# Patient Record
Sex: Female | Born: 1945 | Race: White | Hispanic: No | Marital: Married | Smoking: Current every day smoker
Health system: Southern US, Community
[De-identification: ages and names within clinical notes are randomized; demographics above are authoritative.]

## PROBLEM LIST (undated history)

## (undated) DIAGNOSIS — F32A Depression, unspecified: Secondary | ICD-10-CM

## (undated) DIAGNOSIS — F419 Anxiety disorder, unspecified: Secondary | ICD-10-CM

## (undated) DIAGNOSIS — C3490 Malignant neoplasm of unspecified part of unspecified bronchus or lung: Secondary | ICD-10-CM

## (undated) DIAGNOSIS — K219 Gastro-esophageal reflux disease without esophagitis: Secondary | ICD-10-CM

## (undated) DIAGNOSIS — G8929 Other chronic pain: Secondary | ICD-10-CM

## (undated) DIAGNOSIS — J449 Chronic obstructive pulmonary disease, unspecified: Secondary | ICD-10-CM

## (undated) HISTORY — PX: BACK SURGERY: SHX140

## (undated) HISTORY — PX: HERNIA REPAIR: SHX51

## (undated) HISTORY — PX: TONSILLECTOMY: SUR1361

---

## 2019-09-08 MED FILL — OXYCODONE-ACETAMINOPHEN 5-3: 5-325 | 7 days supply | Qty: 28 | Fill #0

## 2019-09-26 ENCOUNTER — Other Ambulatory Visit: Payer: Self-pay | Admitting: Physical Medicine and Rehabilitation

## 2019-09-26 DIAGNOSIS — K229 Disease of esophagus, unspecified: Secondary | ICD-10-CM

## 2019-10-05 ENCOUNTER — Ambulatory Visit
Admission: RE | Admit: 2019-10-05 | Discharge: 2019-10-05 | Disposition: A | Discharge status: Home / Self Care | Payer: Federal, State, Local not specified - PPO | Source: Ambulatory Visit | Attending: Physical Medicine and Rehabilitation | Admitting: Physical Medicine and Rehabilitation

## 2019-10-05 ENCOUNTER — Other Ambulatory Visit: Payer: Self-pay

## 2019-10-05 ENCOUNTER — Encounter: Payer: Self-pay | Admitting: Family Medicine

## 2019-10-05 ENCOUNTER — Ambulatory Visit (INDEPENDENT_AMBULATORY_CARE_PROVIDER_SITE_OTHER): Payer: Federal, State, Local not specified - PPO | Admitting: Family Medicine

## 2019-10-05 VITALS — BP 126/82 | HR 98 | Temp 97.5°F | Ht 68.75 in | Wt 101.2 lb

## 2019-10-05 DIAGNOSIS — M810 Age-related osteoporosis without current pathological fracture: Secondary | ICD-10-CM

## 2019-10-05 DIAGNOSIS — Z23 Encounter for immunization: Secondary | ICD-10-CM

## 2019-10-05 DIAGNOSIS — K229 Disease of esophagus, unspecified: Secondary | ICD-10-CM

## 2019-10-05 DIAGNOSIS — F329 Major depressive disorder, single episode, unspecified: Secondary | ICD-10-CM

## 2019-10-05 DIAGNOSIS — S22000A Wedge compression fracture of unspecified thoracic vertebra, initial encounter for closed fracture: Secondary | ICD-10-CM

## 2019-10-05 DIAGNOSIS — F32A Depression, unspecified: Secondary | ICD-10-CM

## 2019-10-05 MED ORDER — IOPAMIDOL (ISOVUE-300) INJECTION 61%
75.0000 mL | Freq: Once | INTRAVENOUS | Status: AC | PRN
  Administered 2019-10-05: 75 mL via INTRAVENOUS

## 2019-10-05 NOTE — Progress Notes (Signed)
   Subjective:    Patient ID: Mariah Bruce, female    DOB: 1946-09-27, 73 y.o.   MRN: [id number]  HPI Chief Complaint  Patient presents with  . new pt    new pt get established. needs something for osteoporosis.    She is new to the practice and here for what I believed to be an establish care visit her sister is in the room with her today. Apparently the patient has been staying with her sister who lives in [place] for the past 2 months. Patient lives in [place] and is her husband is still there. She reports having a PCP in [place].  Her sister would like for the patient to stay here longer but the patient insists that she will be going home to [facility] in the next week.   Patient states the main reason for the visit today is to start on medication for osteoporosis. States she was told by [facility] that she has a compression fracture. She denies ever having a bone density scan.   She has been seeing Dr. Nelva Bush and had a CT chest done earlier today.   States she has pain medication.  No other needs expressed.   Requests flu shot today.   Hx of lung cancer with partial lung removed.   Hx of depression and is taking medication for this.    Denies fever, chills, night sweats, chest pain, shortness of breath, vomiting, diarrhea.     Review of Systems Pertinent positives and negatives in the history of present illness.     Objective:   Physical Exam BP 126/82   Pulse 98   Temp (!) 97.5 F (36.4 C)   Ht 5' 8.75" (1.746 m)   Wt 101 lb 3.2 oz (45.9 kg)   BMI 15.05 kg/m   Alert and oriented and in no acute distress. She is thin and frail appearing. Respirations unlabored. Normal speech, mood, memory and thought process.        Assessment & Plan:  Osteoporosis, unspecified osteoporosis type, unspecified pathological fracture presence  Compression fracture of body of thoracic vertebra (HCC)  Depression, unspecified depression type  Needs flu shot - Plan: Flu Vaccine QUAD  High Dose(Fluad), CANCELED: Flu Vaccine QUAD 36+ mos PF IM (Fluarix)  She is here today with her sister. She and her sister disagree with the patient's plan of staying in [place] vs going back to [facility] where she lives with her husband next week. After further discussion, the patient does not wish to establish care here and will follow up with her PCP in [place] in 1-2 weeks upon return to her home. Flu shot given. Discussed that if she does decide to move here in the future that she is welcome to establish care with our practice.

## 2019-10-09 ENCOUNTER — Other Ambulatory Visit: Payer: Self-pay

## 2020-01-04 ENCOUNTER — Telehealth: Payer: Self-pay | Admitting: Family Medicine

## 2020-01-04 NOTE — Telephone Encounter (Signed)
Requested medical records received from [facility]

## 2020-01-16 ENCOUNTER — Encounter: Payer: Self-pay | Admitting: Family Medicine

## 2020-11-12 ENCOUNTER — Encounter (HOSPITAL_BASED_OUTPATIENT_CLINIC_OR_DEPARTMENT_OTHER): Payer: Self-pay | Admitting: *Deleted

## 2020-11-12 ENCOUNTER — Inpatient Hospital Stay (HOSPITAL_BASED_OUTPATIENT_CLINIC_OR_DEPARTMENT_OTHER)
Admission: EM | Admit: 2020-11-12 | Discharge: 2020-11-15 | DRG: 689 | Disposition: A | Discharge status: Home / Self Care | Payer: Medicare Other | Attending: Internal Medicine | Admitting: Internal Medicine

## 2020-11-12 ENCOUNTER — Other Ambulatory Visit: Payer: Self-pay

## 2020-11-12 ENCOUNTER — Emergency Department (HOSPITAL_BASED_OUTPATIENT_CLINIC_OR_DEPARTMENT_OTHER): Payer: Medicare Other

## 2020-11-12 DIAGNOSIS — N390 Urinary tract infection, site not specified: Secondary | ICD-10-CM | POA: Diagnosis not present

## 2020-11-12 DIAGNOSIS — K2100 Gastro-esophageal reflux disease with esophagitis, without bleeding: Secondary | ICD-10-CM | POA: Diagnosis present

## 2020-11-12 DIAGNOSIS — Z720 Tobacco use: Secondary | ICD-10-CM | POA: Diagnosis present

## 2020-11-12 DIAGNOSIS — F17210 Nicotine dependence, cigarettes, uncomplicated: Secondary | ICD-10-CM | POA: Diagnosis present

## 2020-11-12 DIAGNOSIS — N182 Chronic kidney disease, stage 2 (mild): Secondary | ICD-10-CM | POA: Diagnosis present

## 2020-11-12 DIAGNOSIS — G9341 Metabolic encephalopathy: Secondary | ICD-10-CM | POA: Diagnosis present

## 2020-11-12 DIAGNOSIS — F32A Depression, unspecified: Secondary | ICD-10-CM | POA: Diagnosis present

## 2020-11-12 DIAGNOSIS — J449 Chronic obstructive pulmonary disease, unspecified: Secondary | ICD-10-CM | POA: Diagnosis present

## 2020-11-12 DIAGNOSIS — K209 Esophagitis, unspecified without bleeding: Secondary | ICD-10-CM

## 2020-11-12 DIAGNOSIS — Z681 Body mass index (BMI) 19 or less, adult: Secondary | ICD-10-CM

## 2020-11-12 DIAGNOSIS — G8929 Other chronic pain: Secondary | ICD-10-CM | POA: Diagnosis present

## 2020-11-12 DIAGNOSIS — Z79899 Other long term (current) drug therapy: Secondary | ICD-10-CM

## 2020-11-12 DIAGNOSIS — R079 Chest pain, unspecified: Secondary | ICD-10-CM

## 2020-11-12 DIAGNOSIS — C3490 Malignant neoplasm of unspecified part of unspecified bronchus or lung: Secondary | ICD-10-CM | POA: Diagnosis present

## 2020-11-12 DIAGNOSIS — Z880 Allergy status to penicillin: Secondary | ICD-10-CM

## 2020-11-12 DIAGNOSIS — R918 Other nonspecific abnormal finding of lung field: Secondary | ICD-10-CM | POA: Diagnosis present

## 2020-11-12 DIAGNOSIS — E43 Unspecified severe protein-calorie malnutrition: Secondary | ICD-10-CM | POA: Insufficient documentation

## 2020-11-12 DIAGNOSIS — Z20822 Contact with and (suspected) exposure to covid-19: Secondary | ICD-10-CM | POA: Diagnosis present

## 2020-11-12 DIAGNOSIS — R636 Underweight: Secondary | ICD-10-CM | POA: Diagnosis present

## 2020-11-12 DIAGNOSIS — Z902 Acquired absence of lung [part of]: Secondary | ICD-10-CM

## 2020-11-12 DIAGNOSIS — N179 Acute kidney failure, unspecified: Secondary | ICD-10-CM | POA: Diagnosis present

## 2020-11-12 DIAGNOSIS — Z79891 Long term (current) use of opiate analgesic: Secondary | ICD-10-CM

## 2020-11-12 DIAGNOSIS — Z7952 Long term (current) use of systemic steroids: Secondary | ICD-10-CM

## 2020-11-12 DIAGNOSIS — Z85118 Personal history of other malignant neoplasm of bronchus and lung: Secondary | ICD-10-CM

## 2020-11-12 DIAGNOSIS — F419 Anxiety disorder, unspecified: Secondary | ICD-10-CM | POA: Diagnosis present

## 2020-11-12 DIAGNOSIS — R319 Hematuria, unspecified: Secondary | ICD-10-CM | POA: Diagnosis present

## 2020-11-12 HISTORY — DX: Anxiety disorder, unspecified: F41.9

## 2020-11-12 HISTORY — DX: Chronic obstructive pulmonary disease, unspecified: J44.9

## 2020-11-12 HISTORY — DX: Depression, unspecified: F32.A

## 2020-11-12 HISTORY — DX: Gastro-esophageal reflux disease without esophagitis: K21.9

## 2020-11-12 HISTORY — DX: Other chronic pain: G89.29

## 2020-11-12 HISTORY — DX: Malignant neoplasm of unspecified part of unspecified bronchus or lung: C34.90

## 2020-11-12 LAB — URINALYSIS, ROUTINE W REFLEX MICROSCOPIC

## 2020-11-12 LAB — COMPREHENSIVE METABOLIC PANEL
ALT: 31 U/L (ref 0–44)
AST: 43 U/L — ABNORMAL HIGH (ref 15–41)
Albumin: 4.9 g/dL (ref 3.5–5.0)
Alkaline Phosphatase: 137 U/L — ABNORMAL HIGH (ref 38–126)
Anion gap: 16 — ABNORMAL HIGH (ref 5–15)
BUN: 26 mg/dL — ABNORMAL HIGH (ref 8–23)
CO2: 25 mmol/L (ref 22–32)
Calcium: 10.1 mg/dL (ref 8.9–10.3)
Chloride: 93 mmol/L — ABNORMAL LOW (ref 98–111)
Creatinine, Ser: 1.55 mg/dL — ABNORMAL HIGH (ref 0.44–1.00)
GFR, Estimated: 35 mL/min — ABNORMAL LOW (ref >60)
Glucose, Bld: 163 mg/dL — ABNORMAL HIGH (ref 70–99)
Potassium: 4.8 mmol/L (ref 3.5–5.1)
Sodium: 134 mmol/L — ABNORMAL LOW (ref 135–145)
Total Bilirubin: 0.5 mg/dL (ref 0.3–1.2)
Total Protein: 8.8 g/dL — ABNORMAL HIGH (ref 6.5–8.1)

## 2020-11-12 LAB — CBC WITH DIFFERENTIAL/PLATELET
Abs Immature Granulocytes: 0.09 10*3/uL — ABNORMAL HIGH (ref 0.00–0.07)
Basophils Absolute: 0 10*3/uL (ref 0.0–0.1)
Basophils Relative: 0 %
Eosinophils Absolute: 0 10*3/uL (ref 0.0–0.5)
Eosinophils Relative: 0 %
HCT: 46.6 % — ABNORMAL HIGH (ref 36.0–46.0)
Hemoglobin: 15.2 g/dL — ABNORMAL HIGH (ref 12.0–15.0)
Immature Granulocytes: 1 %
Lymphocytes Relative: 5 %
Lymphs Abs: 0.9 10*3/uL (ref 0.7–4.0)
MCH: 32.4 pg (ref 26.0–34.0)
MCHC: 32.6 g/dL (ref 30.0–36.0)
MCV: 99.4 fL (ref 80.0–100.0)
Monocytes Absolute: 0.4 10*3/uL (ref 0.1–1.0)
Monocytes Relative: 2 %
Neutro Abs: 15.8 10*3/uL — ABNORMAL HIGH (ref 1.7–7.7)
Neutrophils Relative %: 92 %
Platelets: 380 10*3/uL (ref 150–400)
RBC: 4.69 MIL/uL (ref 3.87–5.11)
RDW: 13.7 % (ref 11.5–15.5)
WBC: 17.2 10*3/uL — ABNORMAL HIGH (ref 4.0–10.5)
nRBC: 0 % (ref 0.0–0.2)

## 2020-11-12 LAB — URINALYSIS, MICROSCOPIC (REFLEX): RBC / HPF: >50 RBC/hpf (ref 0–5)

## 2020-11-12 NOTE — ED Triage Notes (Signed)
Incontinent of urine for a week. Hematuria this am.

## 2020-11-12 NOTE — ED Notes (Signed)
Unable to urinate

## 2020-11-12 NOTE — ED Notes (Signed)
Pt. Reports bleeding with urination just started today.

## 2020-11-13 ENCOUNTER — Inpatient Hospital Stay (HOSPITAL_COMMUNITY): Payer: Medicare Other

## 2020-11-13 ENCOUNTER — Emergency Department (HOSPITAL_BASED_OUTPATIENT_CLINIC_OR_DEPARTMENT_OTHER): Payer: Medicare Other

## 2020-11-13 ENCOUNTER — Encounter (HOSPITAL_BASED_OUTPATIENT_CLINIC_OR_DEPARTMENT_OTHER): Payer: Self-pay | Admitting: Emergency Medicine

## 2020-11-13 DIAGNOSIS — J449 Chronic obstructive pulmonary disease, unspecified: Secondary | ICD-10-CM | POA: Diagnosis present

## 2020-11-13 DIAGNOSIS — Z79899 Other long term (current) drug therapy: Secondary | ICD-10-CM | POA: Diagnosis not present

## 2020-11-13 DIAGNOSIS — E43 Unspecified severe protein-calorie malnutrition: Secondary | ICD-10-CM | POA: Diagnosis present

## 2020-11-13 DIAGNOSIS — F17210 Nicotine dependence, cigarettes, uncomplicated: Secondary | ICD-10-CM | POA: Diagnosis present

## 2020-11-13 DIAGNOSIS — N179 Acute kidney failure, unspecified: Secondary | ICD-10-CM

## 2020-11-13 DIAGNOSIS — N3001 Acute cystitis with hematuria: Secondary | ICD-10-CM

## 2020-11-13 DIAGNOSIS — F419 Anxiety disorder, unspecified: Secondary | ICD-10-CM | POA: Diagnosis present

## 2020-11-13 DIAGNOSIS — Z79891 Long term (current) use of opiate analgesic: Secondary | ICD-10-CM | POA: Diagnosis not present

## 2020-11-13 DIAGNOSIS — N182 Chronic kidney disease, stage 2 (mild): Secondary | ICD-10-CM | POA: Diagnosis present

## 2020-11-13 DIAGNOSIS — R319 Hematuria, unspecified: Secondary | ICD-10-CM | POA: Diagnosis present

## 2020-11-13 DIAGNOSIS — Z880 Allergy status to penicillin: Secondary | ICD-10-CM | POA: Diagnosis not present

## 2020-11-13 DIAGNOSIS — Z7952 Long term (current) use of systemic steroids: Secondary | ICD-10-CM | POA: Diagnosis not present

## 2020-11-13 DIAGNOSIS — G9341 Metabolic encephalopathy: Secondary | ICD-10-CM | POA: Diagnosis present

## 2020-11-13 DIAGNOSIS — N390 Urinary tract infection, site not specified: Secondary | ICD-10-CM | POA: Diagnosis present

## 2020-11-13 DIAGNOSIS — Z902 Acquired absence of lung [part of]: Secondary | ICD-10-CM | POA: Diagnosis not present

## 2020-11-13 DIAGNOSIS — C3492 Malignant neoplasm of unspecified part of left bronchus or lung: Secondary | ICD-10-CM

## 2020-11-13 DIAGNOSIS — Z20822 Contact with and (suspected) exposure to covid-19: Secondary | ICD-10-CM | POA: Diagnosis present

## 2020-11-13 DIAGNOSIS — C3490 Malignant neoplasm of unspecified part of unspecified bronchus or lung: Secondary | ICD-10-CM | POA: Diagnosis present

## 2020-11-13 DIAGNOSIS — Z720 Tobacco use: Secondary | ICD-10-CM | POA: Diagnosis present

## 2020-11-13 DIAGNOSIS — R636 Underweight: Secondary | ICD-10-CM | POA: Diagnosis present

## 2020-11-13 DIAGNOSIS — F32A Depression, unspecified: Secondary | ICD-10-CM | POA: Diagnosis present

## 2020-11-13 DIAGNOSIS — Z681 Body mass index (BMI) 19 or less, adult: Secondary | ICD-10-CM | POA: Diagnosis not present

## 2020-11-13 DIAGNOSIS — G8929 Other chronic pain: Secondary | ICD-10-CM | POA: Diagnosis present

## 2020-11-13 DIAGNOSIS — Z85118 Personal history of other malignant neoplasm of bronchus and lung: Secondary | ICD-10-CM | POA: Diagnosis not present

## 2020-11-13 DIAGNOSIS — R918 Other nonspecific abnormal finding of lung field: Secondary | ICD-10-CM | POA: Diagnosis present

## 2020-11-13 DIAGNOSIS — K2100 Gastro-esophageal reflux disease with esophagitis, without bleeding: Secondary | ICD-10-CM | POA: Diagnosis present

## 2020-11-13 LAB — TROPONIN I (HIGH SENSITIVITY)
Troponin I (High Sensitivity): 15 ng/L (ref <18)
Troponin I (High Sensitivity): 16 ng/L (ref <18)

## 2020-11-13 LAB — BASIC METABOLIC PANEL
Anion gap: 13 (ref 5–15)
BUN: 19 mg/dL (ref 8–23)
CO2: 24 mmol/L (ref 22–32)
Calcium: 9.3 mg/dL (ref 8.9–10.3)
Chloride: 99 mmol/L (ref 98–111)
Creatinine, Ser: 1.19 mg/dL — ABNORMAL HIGH (ref 0.44–1.00)
GFR, Estimated: 48 mL/min — ABNORMAL LOW (ref >60)
Glucose, Bld: 77 mg/dL (ref 70–99)
Potassium: 3.9 mmol/L (ref 3.5–5.1)
Sodium: 136 mmol/L (ref 135–145)

## 2020-11-13 LAB — PROTIME-INR
INR: 1.1 (ref 0.8–1.2)
Prothrombin Time: 13.4 seconds (ref 11.4–15.2)

## 2020-11-13 LAB — CBC
HCT: 36.2 % (ref 36.0–46.0)
Hemoglobin: 12.1 g/dL (ref 12.0–15.0)
MCH: 33 pg (ref 26.0–34.0)
MCHC: 33.4 g/dL (ref 30.0–36.0)
MCV: 98.6 fL (ref 80.0–100.0)
Platelets: 237 10*3/uL (ref 150–400)
RBC: 3.67 MIL/uL — ABNORMAL LOW (ref 3.87–5.11)
RDW: 13.5 % (ref 11.5–15.5)
WBC: 9.9 10*3/uL (ref 4.0–10.5)
nRBC: 0 % (ref 0.0–0.2)

## 2020-11-13 LAB — RESP PANEL BY RT-PCR (FLU A&B, COVID) ARPGX2
Influenza A by PCR: NEGATIVE
Influenza B by PCR: NEGATIVE
SARS Coronavirus 2 by RT PCR: NEGATIVE

## 2020-11-13 LAB — APTT: aPTT: 27 seconds (ref 24–36)

## 2020-11-13 MED ORDER — GABAPENTIN 300 MG PO CAPS
600.0000 mg | ORAL_CAPSULE | Freq: Every day | ORAL | Status: DC
  Administered 2020-11-13 – 2020-11-14 (×2): 600 mg via ORAL
  Filled 2020-11-13 (×2): qty 2

## 2020-11-13 MED ORDER — ACETAMINOPHEN 325 MG PO TABS
650.0000 mg | ORAL_TABLET | Freq: Four times a day (QID) | ORAL | Status: DC | PRN
  Administered 2020-11-15: 650 mg via ORAL
  Filled 2020-11-13 (×2): qty 2

## 2020-11-13 MED ORDER — GABAPENTIN 300 MG PO CAPS
300.0000 mg | ORAL_CAPSULE | Freq: Every day | ORAL | Status: DC
  Administered 2020-11-13 – 2020-11-15 (×3): 300 mg via ORAL
  Filled 2020-11-13 (×3): qty 1

## 2020-11-13 MED ORDER — FAMOTIDINE 20 MG PO TABS
40.0000 mg | ORAL_TABLET | Freq: Every day | ORAL | Status: DC
  Administered 2020-11-13 – 2020-11-15 (×3): 40 mg via ORAL
  Filled 2020-11-13 (×3): qty 2

## 2020-11-13 MED ORDER — SODIUM CHLORIDE 0.9 % IV SOLN: INTRAVENOUS | Status: DC

## 2020-11-13 MED ORDER — ONDANSETRON HCL 4 MG PO TABS: 4.0000 mg | ORAL_TABLET | Freq: Four times a day (QID) | ORAL | Status: DC | PRN

## 2020-11-13 MED ORDER — DEXAMETHASONE 4 MG PO TABS
4.0000 mg | ORAL_TABLET | Freq: Every day | ORAL | Status: DC
  Administered 2020-11-13 – 2020-11-14 (×2): 4 mg via ORAL
  Filled 2020-11-13 (×2): qty 1

## 2020-11-13 MED ORDER — CITALOPRAM HYDROBROMIDE 20 MG PO TABS: 30.0000 mg | ORAL_TABLET | Freq: Every day | ORAL | Status: DC

## 2020-11-13 MED ORDER — CHLORHEXIDINE GLUCONATE CLOTH 2 % EX PADS
6.0000 | MEDICATED_PAD | Freq: Every day | CUTANEOUS | Status: DC
  Administered 2020-11-14: 6 via TOPICAL

## 2020-11-13 MED ORDER — SODIUM CHLORIDE 0.9 % IV SOLN
1.0000 g | INTRAVENOUS | Status: DC
  Administered 2020-11-13 – 2020-11-14 (×2): 1 g via INTRAVENOUS
  Filled 2020-11-13 (×2): qty 1
  Filled 2020-11-13: qty 10

## 2020-11-13 MED ORDER — SODIUM CHLORIDE 0.9 % IV SOLN: INTRAVENOUS | Status: AC

## 2020-11-13 MED ORDER — ALBUTEROL SULFATE (2.5 MG/3ML) 0.083% IN NEBU: 2.5000 mg | INHALATION_SOLUTION | Freq: Four times a day (QID) | RESPIRATORY_TRACT | Status: DC | PRN

## 2020-11-13 MED ORDER — VENLAFAXINE HCL ER 75 MG PO CP24
75.0000 mg | ORAL_CAPSULE | Freq: Every day | ORAL | Status: DC
Start: 2020-11-14 — End: 2020-11-14
  Filled 2020-11-13: qty 1

## 2020-11-13 MED ORDER — CIPROFLOXACIN IN D5W 400 MG/200ML IV SOLN
400.0000 mg | Freq: Once | INTRAVENOUS | Status: AC
  Administered 2020-11-13: 400 mg via INTRAVENOUS
  Filled 2020-11-13: qty 200

## 2020-11-13 MED ORDER — HYDRALAZINE HCL 20 MG/ML IJ SOLN
10.0000 mg | INTRAMUSCULAR | Status: DC | PRN
  Administered 2020-11-13: 10 mg via INTRAVENOUS
  Filled 2020-11-13: qty 1

## 2020-11-13 MED ORDER — DICLOFENAC SODIUM 1 % EX GEL
2.0000 g | Freq: Two times a day (BID) | CUTANEOUS | Status: DC | PRN
  Filled 2020-11-13: qty 100

## 2020-11-13 MED ORDER — ONDANSETRON HCL 4 MG/2ML IJ SOLN: 4.0000 mg | Freq: Four times a day (QID) | INTRAMUSCULAR | Status: DC | PRN

## 2020-11-13 MED ORDER — LORAZEPAM 0.5 MG PO TABS: 0.5000 mg | ORAL_TABLET | ORAL | Status: DC | PRN

## 2020-11-13 MED ORDER — SENNOSIDES-DOCUSATE SODIUM 8.6-50 MG PO TABS
2.0000 | ORAL_TABLET | Freq: Two times a day (BID) | ORAL | Status: DC
  Administered 2020-11-13 – 2020-11-15 (×5): 2 via ORAL
  Filled 2020-11-13 (×5): qty 2

## 2020-11-13 MED ORDER — GABAPENTIN 300 MG PO CAPS: 300.0000 mg | ORAL_CAPSULE | ORAL | Status: DC

## 2020-11-13 MED ORDER — IOHEXOL 300 MG/ML  SOLN
100.0000 mL | Freq: Once | INTRAMUSCULAR | Status: AC | PRN
  Administered 2020-11-13: 60 mL via INTRAVENOUS

## 2020-11-13 MED ORDER — AMITRIPTYLINE HCL 10 MG PO TABS
10.0000 mg | ORAL_TABLET | Freq: Every day | ORAL | Status: DC
  Administered 2020-11-13 – 2020-11-14 (×2): 10 mg via ORAL
  Filled 2020-11-13 (×3): qty 1

## 2020-11-13 MED ORDER — VENLAFAXINE HCL ER 37.5 MG PO CP24
37.5000 mg | ORAL_CAPSULE | Freq: Every day | ORAL | Status: DC
  Administered 2020-11-13: 37.5 mg via ORAL
  Filled 2020-11-13 (×2): qty 1

## 2020-11-13 MED ORDER — SODIUM CHLORIDE 0.9% FLUSH
3.0000 mL | Freq: Two times a day (BID) | INTRAVENOUS | Status: DC
  Administered 2020-11-13 – 2020-11-14 (×3): 3 mL via INTRAVENOUS

## 2020-11-13 MED ORDER — ACETAMINOPHEN 650 MG RE SUPP: 650.0000 mg | Freq: Four times a day (QID) | RECTAL | Status: DC | PRN

## 2020-11-13 NOTE — ED Notes (Signed)
Report given to [facility]. 

## 2020-11-13 NOTE — H&P (Addendum)
History and Physical    Mariah Bruce VPX:[id number] DOB: 25-Nov-1945 DOA: 11/12/2020  Referring MD/NP/PA: Shela Leff, MD PCP: Girtha Rm, NP-C  Patient coming from: [facility] transfer  Chief Complaint:"peeing blood"  I have personally briefly reviewed patient's old medical records in [place]   HPI: Mariah Bruce is a 76 y.o. female with medical history significant of lung cancer, COPD, anxiety, depression, tobacco abuse, and GERD presents with complaints of blood in her urine.  Patient is visiting her sister from [place], [place] and has been here 2 days.  Symptoms started yesterday mid afternoon.  Initially reported brown urine that turned to bright red with passage of some clots.  Patient admits to using ibuprofen intermittently.  Over the last week patient has had some middle back pain that wraps around to her stomach and complains that her stomach has been kind of bloated.  Denies having any recent fever, dysuria, chest pain, shortness of breath, nausea, vomiting, or diarrhea.  At this time she continues to smoke about a pack of cigarettes per day on average, but declines nicotine patch.  Patient was diagnosed with lung cancer back in 2014, and notes that it was surgically removed.  She is unsure of what kind of cancer it was and did not have radiation or chemotherapy.  Patient reports that she does not want to have any further work-up of the lung cancer.  Addendum: Patient sister comes and notifies me that the patient's has been more forgetful and more anxious.  ED Course: Upon admission to the emergency department patient was seen to be afebrile with blood pressure 95/65-180/97, and all other vital signs maintained.  Labs significant for WBC 17.2, hemoglobin 15.2, sodium 134, BUN 26, creatinine 1.55, glucose 163, AST 43,.  Urinalysis was positive for bloody urine with few bacteria.  Influenza and COVID-19 screening were both negative.  CT scans of the neck and chest  revealed concern for left vocal cord paralysis, distal esophagitis, spiculated foci within the medial left lung apex measuring 9 x 17 mm(increased from 7 x 11 mm), new subpleural nodule measuring 12.5 x 8 mm on the right lower lobe periphery.  A Foley catheter was placed in the emergency department.  Patient was given empiric antibiotics of ciprofloxacin 400 mg IV and started on normal saline IV fluids at 125 mL/h.  Review of Systems  Constitutional: Negative for fever and malaise/fatigue.  HENT: Negative for congestion and nosebleeds.   Eyes: Negative for photophobia and pain.  Respiratory: Negative for shortness of breath.   Cardiovascular: Negative for chest pain and leg swelling.  Gastrointestinal: Positive for abdominal pain. Negative for blood in stool.  Genitourinary: Positive for dysuria and hematuria.  Musculoskeletal: Positive for back pain.  Skin: Negative for rash.  Neurological: Negative for loss of consciousness.  Psychiatric/Behavioral: Positive for substance abuse. The patient is nervous/anxious.     Past Medical History:  Diagnosis Date  . Anxiety   . Chronic pain   . Depression   . GERD (gastroesophageal reflux disease)     Past Surgical History:  Procedure Laterality Date  . BACK SURGERY    . HERNIA REPAIR    . TONSILLECTOMY       reports that she has been smoking cigarettes. She has a 55.00 pack-year smoking history. She has never used smokeless tobacco. She reports current alcohol use. She reports that she does not use drugs.  Allergies  Allergen Reactions  . Penicillin G Rash    History reviewed. No pertinent  family history.  Prior to Admission medications   Medication Sig Start Date End Date Taking? Authorizing Provider  amitriptyline (ELAVIL) 10 MG tablet Take 1 tablet by mouth 2 (two) times daily. 07/31/19  Yes [provider]  escitalopram (LEXAPRO) 20 MG tablet Take 1 tablet by mouth daily. 07/31/19  Yes [provider]   gabapentin (NEURONTIN) 300 MG capsule Take 1 capsule by mouth 3 (three) times daily. 07/28/19  Yes [provider]  oxyCODONE-acetaminophen (PERCOCET/ROXICET) 5-325 MG tablet Take 1 tablet by mouth 3 (three) times daily.   Yes [provider]  predniSONE (DELTASONE) 10 MG tablet Take 10 mg by mouth.   Yes [provider]  senna-docusate (SENOKOT-S) 8.6-50 MG tablet Take 1 tablet by mouth daily.   Yes [provider]  calcitonin, salmon, (MIACALCIN/FORTICAL) 200 UNIT/ACT nasal spray Place 1 spray into alternate nostrils daily.    [provider]  calcium carbonate (OS-CAL) 600 MG TABS tablet Take 600 mg by mouth 2 (two) times daily with a meal.    [provider]  diclofenac Sodium (VOLTAREN) 1 % GEL Apply 1 g topically 3 (three) times daily.    [provider]  Lidocaine HCl-Benzyl Alcohol (SALONPAS LIDOCAINE PLUS EX) Apply topically.    [provider]    Physical Exam:  Constitutional: Thin frail female who appears to be in no acute Vitals:   11/13/20 0155 11/13/20 0300 11/13/20 0430 11/13/20 0556  BP: (!) 157/74 (!) 168/76 (!) 167/81 (!) 180/97  Pulse: 69 67  71  Resp: 18 16 13 18   Temp:    98.1 F (36.7 C)  TempSrc:    Oral  SpO2: 98% 95%  96%  Weight:      Height:       Eyes: PERRL, lids and conjunctivae normal ENMT: Mucous membranes are moist. Posterior pharynx clear of any exudate or lesions.  Neck: normal, supple, no masses, no thyromegaly Respiratory: clear to auscultation bilaterally, no wheezing, no crackles. Normal respiratory effort. No accessory muscle use.  Cardiovascular: Regular rate and rhythm, no murmurs / rubs / gallops. No extremity edema. 2+ pedal pulses. No carotid bruits.  Abdomen: Mild distention of the abdomen appreciated, but bowel sounds present. Musculoskeletal: no clubbing / cyanosis. No joint deformity upper and lower extremities. Good ROM, no contractures.  Skin: no rashes,  lesions, ulcers. No induration Neurologic: CN 2-12 grossly intact. Sensation intact, DTR normal. Strength 5/5 in all 4.  Psychiatric: Trouble with memory although alert and oriented x3 at this time.  Anxious.    Labs on Admission: I have personally reviewed following labs and imaging studies  CBC: Recent Labs  Lab 11/12/20 2022  WBC 17.2*  NEUTROABS 15.8*  HGB 15.2*  HCT 46.6*  MCV 99.4  PLT 681   Basic Metabolic Panel: Recent Labs  Lab 11/12/20 2022  NA 134*  K 4.8  CL 93*  CO2 25  GLUCOSE 163*  BUN 26*  CREATININE 1.55*  CALCIUM 10.1   GFR: Estimated Creatinine Clearance: 22.8 mL/min (A) (by C-G formula based on SCr of 1.55 mg/dL (H)). Liver Function Tests: Recent Labs  Lab 11/12/20 2022  AST 43*  ALT 31  ALKPHOS 137*  BILITOT 0.5  PROT 8.8*  ALBUMIN 4.9   No results for input(s): LIPASE, AMYLASE in the last 168 hours. No results for input(s): AMMONIA in the last 168 hours. Coagulation Profile: No results for input(s): INR, PROTIME in the last 168 hours. Cardiac Enzymes: No results for input(s): CKTOTAL, CKMB,  CKMBINDEX, TROPONINI in the last 168 hours. BNP (last 3 results) No results for input(s): PROBNP in the last 8760 hours. HbA1C: No results for input(s): HGBA1C in the last 72 hours. CBG: No results for input(s): GLUCAP in the last 168 hours. Lipid Profile: No results for input(s): CHOL, HDL, LDLCALC, TRIG, CHOLHDL, LDLDIRECT in the last 72 hours. Thyroid Function Tests: No results for input(s): TSH, T4TOTAL, FREET4, T3FREE, THYROIDAB in the last 72 hours. Anemia Panel: No results for input(s): VITAMINB12, FOLATE, FERRITIN, TIBC, IRON, RETICCTPCT in the last 72 hours. Urine analysis:    Component Value Date/Time   COLORURINE RED (A) 11/12/2020 2022   APPEARANCEUR TURBID (A) 11/12/2020 2022   LABSPEC  11/12/2020 2022    TEST NOT REPORTED DUE TO COLOR INTERFERENCE OF URINE PIGMENT   PHURINE  11/12/2020 2022    TEST NOT REPORTED DUE TO COLOR  INTERFERENCE OF URINE PIGMENT   GLUCOSEU (A) 11/12/2020 2022    TEST NOT REPORTED DUE TO COLOR INTERFERENCE OF URINE PIGMENT   HGBUR (A) 11/12/2020 2022    TEST NOT REPORTED DUE TO COLOR INTERFERENCE OF URINE PIGMENT   BILIRUBINUR (A) 11/12/2020 2022    TEST NOT REPORTED DUE TO COLOR INTERFERENCE OF URINE PIGMENT   KETONESUR (A) 11/12/2020 2022    TEST NOT REPORTED DUE TO COLOR INTERFERENCE OF URINE PIGMENT   PROTEINUR (A) 11/12/2020 2022    TEST NOT REPORTED DUE TO COLOR INTERFERENCE OF URINE PIGMENT   NITRITE (A) 11/12/2020 2022    TEST NOT REPORTED DUE TO COLOR INTERFERENCE OF URINE PIGMENT   LEUKOCYTESUR (A) 11/12/2020 2022    TEST NOT REPORTED DUE TO COLOR INTERFERENCE OF URINE PIGMENT   Sepsis Labs: Recent Results (from the past 240 hour(s))  Resp Panel by RT-PCR (Flu A&B, Covid) Nasopharyngeal Swab     Status: None   Collection Time: 11/13/20  2:26 AM   Specimen: Nasopharyngeal Swab; Nasopharyngeal(NP) swabs in vial transport medium  Result Value Ref Range Status   SARS Coronavirus 2 by RT PCR NEGATIVE NEGATIVE Final    Comment: (NOTE) SARS-CoV-2 target nucleic acids are NOT DETECTED.  The SARS-CoV-2 RNA is generally detectable in upper respiratory specimens during the acute phase of infection. The lowest concentration of SARS-CoV-2 viral copies this assay can detect is 138 copies/mL. A negative result does not preclude SARS-Cov-2 infection and should not be used as the sole basis for treatment or other patient management decisions. A negative result may occur with  improper specimen collection/handling, submission of specimen other than nasopharyngeal swab, presence of viral mutation(s) within the areas targeted by this assay, and inadequate number of viral copies(<138 copies/mL). A negative result must be combined with clinical observations, patient history, and epidemiological information. The expected result is Negative.  Fact Sheet for Patients:   EntrepreneurPulse.com.au  Fact Sheet for Healthcare Providers:  IncredibleEmployment.be  This test is no t yet approved or cleared by the [place] FDA and  has been authorized for detection and/or diagnosis of SARS-CoV-2 by FDA under an Emergency Use Authorization (EUA). This EUA will remain  in effect (meaning this test can be used) for the duration of the COVID-19 declaration under Section 564(b)(1) of the Act, 21 U.S.C.section 360bbb-3(b)(1), unless the authorization is terminated  or revoked sooner.       Influenza A by PCR NEGATIVE NEGATIVE Final   Influenza B by PCR NEGATIVE NEGATIVE Final    Comment: (NOTE) The Xpert Xpress SARS-CoV-2/FLU/RSV plus assay is intended as an aid in the  diagnosis of influenza from Nasopharyngeal swab specimens and should not be used as a sole basis for treatment. Nasal washings and aspirates are unacceptable for Xpert Xpress SARS-CoV-2/FLU/RSV testing.  Fact Sheet for Patients: EntrepreneurPulse.com.au  Fact Sheet for Healthcare Providers: IncredibleEmployment.be  This test is not yet approved or cleared by the [place] FDA and has been authorized for detection and/or diagnosis of SARS-CoV-2 by FDA under an Emergency Use Authorization (EUA). This EUA will remain in effect (meaning this test can be used) for the duration of the COVID-19 declaration under Section 564(b)(1) of the Act, 21 U.S.C. section 360bbb-3(b)(1), unless the authorization is terminated or revoked.  Performed at [facility], [address] [place], [place] [address]      Radiological Exams on Admission: CT Soft Tissue Neck W Contrast  Result Date: 11/13/2020 CLINICAL DATA:  Esophageal pain EXAM: CT NECK WITH CONTRAST TECHNIQUE: Multidetector CT imaging of the neck was performed using the standard protocol following the bolus administration of intravenous contrast. CONTRAST:   65mL OMNIPAQUE IOHEXOL 300 MG/ML  SOLN COMPARISON:  None. FINDINGS: Pharynx and larynx: There is medialization of the left vocal fold with dilatation of the left piriform sinus. The larynx and pharynx are otherwise normal. Normal epiglottis. No retropharyngeal abnormality. Esophagus is patulous. There is layering contrast material visible within the upper thoracic esophagus. Salivary glands: No inflammation, mass, or stone. Thyroid: Normal. Lymph nodes: None enlarged or abnormal density. Vascular: Calcific aortic atherosclerosis. Limited intracranial: Normal Visualized orbits: Normal Mastoids and visualized paranasal sinuses: Clear. Skeleton: No acute or aggressive process. Upper chest: Please see dedicated report for CT chest. Other: None. IMPRESSION: 1. Medialization of the left vocal fold with dilatation of the left piriform sinus, suggesting left vocal cord paralysis. 2. Patulous esophagus. Aortic Atherosclerosis (ICD10-I70.0). Electronically Signed   By: Ulyses Jarred M.D.   On: 11/13/2020 01:51   CT Chest W Contrast  Result Date: 11/13/2020 CLINICAL DATA:  Chest pain, shortness of breath, incontinent urine, hematuria, abnormal abdominal CT EXAM: CT CHEST WITH CONTRAST TECHNIQUE: Multidetector CT imaging of the chest was performed during intravenous contrast administration. CONTRAST:  72mL OMNIPAQUE IOHEXOL 300 MG/ML  SOLN COMPARISON:  Same-day CT abdomen and pelvis, CT chest 10/05/2019 FINDINGS: Cardiovascular: Cardiac size top-normal to mildly enlarged. Mild compression of the right heart by a chronic chest wall deformity. Calcifications of the mitral annulus and minimally upon the aortic leaflets. Coronary artery calcifications are present as well. No pericardial effusion. No large central or lobar pulmonary artery filling defects within the limitations of this unenhanced CT. Surgical truncation of left hilar vessels, likely related to prior lobectomy. Atherosclerotic plaque within the normal caliber  aorta. No acute luminal abnormality of the imaged aorta. No periaortic stranding or hemorrhage. Normal 3 vessel branching of the aortic arch. Proximal great vessels are mildly calcified but otherwise unremarkable. Mediastinum/Nodes: High attenuation enteric contrast media was administered with the patient on the table for assessment of the thoracic esophagus. There is circumferential thickening of the distal thoracic esophagus and to a lesser extent the GE junction however, there is no extravasation of the enteric contrast media nor extraluminal free air to suggest esophageal perforation. Some minimal adjacent stranding is likely reactive. Fluid within the pericardial recesses. No free mediastinal fluid or gas. No organized collection or abscess. Normal thyroid gland and thoracic inlet. No acute abnormality of the trachea or esophagus. No worrisome mediastinal, hilar or axillary adenopathy. Lungs/Pleura: Postsurgical changes in the lungs likely reflecting a prior left lobectomy, correlate  with surgical history. There is a small chronic left pleural effusion with some minimal pleural thickening and adjacent atelectasis, not significantly changed from comparison exam. There is increasingly conspicuous spiculated foci in the medial left lung apex measuring now 9 x 17 mm, previously 7 x 11 mm (3/25) additional scarring, retraction architectural distortion is seen involving a spiculated nodule previously seen in the right lung apex (3/27 with changes now extending to the pleural surface measuring 2.5 x 2.2 cm in transaxial dimension. Some additional bandlike scarring is seen in the posterior segment right upper lobe (3/60). New sub solid, subpleural nodule measuring 12.5 x 8 mm (3/116) in the periphery of the right lower lobe. No other concerning pulmonary nodules or masses. Background of centrilobular emphysematous change and chronic bronchitic features. Upper Abdomen: Vascular collaterals in the upper abdomen about the  esophagus and GE junction. Mild thickening near the gastric cardia, contiguous with esophageal thickening above. For additional findings please see same day CT of the abdomen and pelvis. Musculoskeletal: Chest wall asymmetry, possibly postsurgical in nature. Paucity of subcutaneous fat, correlate with nutritional status. Multilevel degenerative changes are present in the imaged portions of the spine. Interval vertebroplasty changes involving the T6, T7 vertebrae, unchanged remote vertebral body height loss T10. Age indeterminate though possibly acute on chronic T11 vertebral plana deformity given some adjacent paraspinal thickening. No other acute or conspicuous osseous lesions. IMPRESSION: 1. Circumferential thickening of the distal thoracic esophagus and gastric cardia. No extravasation of administered enteric contrast media nor extraluminal gas to suggest perforation. Minimal adjacent stranding may be merely reactive. 2. Postsurgical changes in the left lung likely reflecting a prior left lower lobectomy, correlate with surgical history. Chronic left basilar effusion and pleural thickening is grossly similar to prior. 3. Increasingly conspicuous spiculated foci in the medial left lung apex measuring now 9 x 17 mm, previously 7 x 11 mm. Additional scarring, retraction architectural distortion is seen involving a spiculated nodule previously seen in the right lung apex with changes now extending to the pleural surface measuring 2.5 x 2.2 cm in transaxial dimension. Findings are concerning for possible malignancy. 4. New sub solid, subpleural nodule measuring 12.5 x 8 mm in the periphery of the right lower lobe. This could reflect an infectious or inflammatory process versus a metastatic lesion. 5. Interval vertebroplasty changes involving the T6, T7 vertebrae. Stable remote compression deformity T10. 6. Possible acute on chronic T11 vertebral plana deformity given some adjacent paraspinal thickening. Correlate for  point tenderness. 7. Chronic chest wall asymmetry. 8. Paucity of subcutaneous fat, correlate with nutritional status. 9. Aortic Atherosclerosis (ICD10-I70.0) 10. Emphysema (ICD10-J43.9) and chronic bronchitic changes. Electronically Signed   By: Lovena Le M.D.   On: 11/13/2020 01:57   CT Renal Stone Study  Result Date: 11/13/2020 CLINICAL DATA:  Hematuria, unknown cause EXAM: CT ABDOMEN AND PELVIS WITHOUT CONTRAST TECHNIQUE: Multidetector CT imaging of the abdomen and pelvis was performed following the standard protocol without IV contrast. COMPARISON:  CT chest 10/05/2019 FINDINGS: Lower chest: Emphysematous changes noted in the lung bases. Bandlike opacity in the lung bases likely reflect areas of scarring. Right lung is otherwise relatively clear. There is a trace left pleural effusion and atelectatic change with some questionable pleural thickening, incompletely assessed on noncontrast exam. Thickened and fluid-filled distal esophagus is noted. Cardiac size within normal limits with slight compression of the right heart by the chest wall deformity. Hypoattenuation of the cardiac blood pool may reflect a mild anemia. Hepatobiliary: No visible focal liver lesion within  limitations of this unenhanced CT gallbladder contains some dependently layering attenuation which may reflect biliary sludge. No pericholecystic fluid or inflammation. No biliary ductal dilatation or intraductal gallstones are seen. Pancreas: There is fatty infiltration of the pancreas, particularly towards the pancreatic head and uncinate. No clear peripancreatic inflammation or pancreatic ductal dilatation. Spleen: Normal in size. No concerning splenic lesions. Adrenals/Urinary Tract: Normal adrenal glands. Slight inferior positioning of the right kidney may related to normal anatomic variance or chest wall deformity. No visible concerning renal lesion. No urolithiasis or hydronephrosis. Mild symmetric bilateral perinephric stranding, a  nonspecific finding which may correlate with advanced age or decreased renal function. Bladder is largely decompressed though wall thickening and hazy perivesicular stranding is more conspicuous than mere underdistention. Additionally, there is some questionable asymmetric thickening on the left posterolateral aspect of the bladder wall and some layering hyperdensity in the urinary bladder lumen concerning for hemorrhagic products/clot given hematuria. Stomach/Bowel: Irregular thickening of the fluid-filled distal thoracic esophagus with adjacent inflammatory stranding lower mediastinum. Extensive paraesophageal and gastric vascular collaterals are noted as well. Distal stomach and duodenum are unremarkable. No small bowel thickening or dilatation. Moderate to large colonic stool burden. Normal appendix. No evidence of bowel obstruction. Vascular/Lymphatic: Upper abdominal vascular collaterals. Atherosclerotic calcifications within the abdominal aorta and branch vessels. No aneurysm or ectasia. No enlarged abdominopelvic lymph nodes. Reproductive: Retroverted uterus.  No concerning adnexal lesions. Other: No abdominopelvic free fluid or free gas. No bowel containing hernias. Musculoskeletal: Chest wall deformity, levocurvature of the lumbar spine, remote appearing incomplete burst fracture involving the superior endplate L5 with slight retropulsion, in combination with severe facet degenerative changes and ligamentum flavum infolding there is resulting severe canal stenosis at the L4-5 level. Additional moderate to severe stenoses present L3-4 as well multilevel moderate to severe neural foraminal narrowing is seen throughout the lumbar levels. The overall sclerotic appearance of the L5 and S1 vertebral bodies is favored to be on a Modic type bases given the absence of adjacent inflammation, fluid collections or other features to suggest infection. IMPRESSION: 1. Bladder is largely decompressed though wall  thickening and hazy perivesicular stranding is more conspicuous than mere underdistention. Additionally, there is some questionable asymmetric thickening on the left posterolateral aspect of the bladder wall and some layering hyperdensity in the urinary bladder lumen concerning for hemorrhagic products/clot given hematuria. Recommend further evaluation with urinalysis and cystoscopy. 2. Mild symmetric bilateral perinephric stranding, a nonspecific finding which may correlate with advanced age or decreased renal function though given bladder findings, ascending tract infection is not excluded. 3. Chronic distal esophageal thickening with some adjacent hazy stranding in the lower mediastinum. Correlate for esophageal symptoms and results of prior endoscopy if performed, otherwise consider direct visualization. 4. Left pleural effusion and atelectatic change with some questionable pleural thickening, incompletely assessed on noncontrast exam. While this could feasibly be related to the esophageal process, the appearance is fairly similar to comparison study from 2020 and could reflect some chronic change. 5. Extensive paraesophageal and gastric vascular collaterals. 6. Remote appearing incomplete burst fracture involving the superior endplate L5 with slight retropulsion, in combination with severe facet degenerative changes and ligamentum flavum infolding there is resulting severe canal stenosis at the L4-5 level. Additional moderate to severe stenoses present L3-4 as well as multilevel moderate to severe neural foraminal narrowing throughout the lumbar levels. 7. Hypoattenuation of the cardiac blood pool may reflect a mild anemia. 8. Aortic Atherosclerosis (ICD10-I70.0) 9. Emphysema (ICD10-J43.9). These results were called by telephone at the  time of interpretation on 11/13/2020 at 12:11 am to provider Tegeler, who verbally acknowledged these results. Electronically Signed   By: Lovena Le M.D.   On: 11/13/2020 00:10     EKG: Independently reviewed.  Sinus rhythm at 69 bpm with biatrial enlargement, LPFB, and RBBB  Assessment/Plan Urinary tract infection with hematuria/: Acute.  Patient presents with complaints of cute onset of blood in urine and complaints of back pain radiating to her stomach.  Urinalysis significant for blood and few bacteria.  Hemoglobin appears to be stable.  CT imaging concerning for bilateral perinephric stranding possible blood clot in the left posterior lateral aspect of the bladder wall concerning for blood clot she had initially been placed on antibiotics of ciprofloxacin IV due to rash penicillin.  Blood cultures have been obtained, but no urine culture. -Admit to medical telemetry bed, but patient requested discontinuation of telemetry monitoring -Follow-up blood cultures -Check urine culture although after IV -Change antibiotics to Rocephin IV  -Discussed case with Dr. Matilde Sprang of urology who recommended to treat for infection regardless of urine culture and okay to follow-up in the outpatient setting  Leukocytosis: Acute.  Initial WBC elevated at 17.2 on 1/11.  Suspect secondary to above. -Continue to monitor   Acute kidney injury superimposed on chronic kidney disease stage IIIb: Patient's baseline creatinine previously noted to be 1.03 on 06/16/2020, but presents with creatinine elevated up to 1.5 follow-up with BUN 26.  Suspecting obstructive cause of symptoms given blood clot and hematuria. -Continue Foley Cath -Normal saline IV fluids 100 mL/h -Continue to monitor kidney function   Acute metabolic encephalopathy: Patient noted to be having difficult time with her memory.  No focal signs of weakness appreciated unclear at this time of symptoms secondary to infection, medications, or other. -Check CT scan of the brain without  Lung cancer: Patient reports history of lung cancer back in 2014 that was resected.  She had not received any radiation or chemotherapy. CT  imaging significant for spiculated foci within the medial left lung apex measuring 9 x 17 mm(increased from 7 x 11 mm) in the area of previous lung cancer and new subpleural nodule measuring 12.5 x 8 mm on the right lower lobe periphery.   She had not been being followed for her lung cancer history and does not want any further work-up. -Patient requested that I get in contact with her sister to give her further details since she is a Marine scientist, but have been unable to be reached.  Anxiety and depression: Home medications include Elavil 10 mg daily, Ativan 0.5-1 mg every 4 hours as needed for anxiety, citalopram 30 mg daily, and Effexor 112.5 mg daily. -Held citalopram as it appears to be the last one added after talks with pharmacy -Will continue to monitor as she appears risk for serotonin syndrome with the current medication regimen.  Question if this is the cause for the patient's increasing anxiety and confusion  Tobacco abuse: Patient still reports smoking at least 1 pack cigarettes per day on average and has at least a 55 smoking pack-year history.  She declined need of a nicotine patch -Continue to counsel on need for cessation of tobacco use  Chronic back pain: Findings as noted on CT imaging.  Patient appears to have been started on Decadron 4 mg daily for issues with her back. -Continue Decadron -Tylenol as needed for mild pain -Voltaren gel as needed for pain  GERD: -Continue Pepcid 40 mg  Underweight: BMI 14.88 kg/m: Albumin noted  the 4.9.  DVT prophylaxis: SCDs Code Status: Full Family Communication: Attempted to call patient's sister multiple times and left voicemail Disposition Plan: Hopefully discharge back home with her sister in 2 to 3 days Consults called: None Admission status: Inpatient, require more than 2 midnight stay due to hematuria  Norval Morton MD [facility]   If 7PM-7AM, please contact night-coverage   11/13/2020, 7:07 AM

## 2020-11-13 NOTE — ED Notes (Signed)
Pt transported to [place] via [facility] 

## 2020-11-13 NOTE — ED Notes (Signed)
Patient transported to CT 

## 2020-11-13 NOTE — Progress Notes (Signed)
Patient refusing labs and telemetry

## 2020-11-13 NOTE — ED Provider Notes (Signed)
[facility] EMERGENCY DEPARTMENT Provider Note   CSN: [id number] Arrival date & time: 11/12/20  1632     History Chief Complaint  Patient presents with  . Hematuria    Mariah Bruce is a 75 y.o. female.  The history is provided by the patient and a relative.  Hematuria This is a new problem. The current episode started more than 2 days ago. The problem occurs constantly. The problem has been rapidly worsening. Pertinent negatives include no chest pain, no abdominal pain and no shortness of breath. Nothing aggravates the symptoms. Nothing relieves the symptoms. She has tried nothing for the symptoms. The treatment provided no relief.  Also dysuria and incontinence of urine and now difficulty making urine.  Patient has ongoing esophageal symptoms.  No f/c/r No vomiting. No rashes on the skin.  No chest pain.       Past Medical History:  Diagnosis Date  . Anxiety   . Chronic pain   . Depression   . GERD (gastroesophageal reflux disease)     Patient Active Problem List   Diagnosis Date Noted  . Hematuria 11/13/2020    Past Surgical History:  Procedure Laterality Date  . BACK SURGERY    . HERNIA REPAIR    . TONSILLECTOMY       OB History   No obstetric history on file.     History reviewed. No pertinent family history.  Social History   Tobacco Use  . Smoking status: Current Every Day Smoker    Packs/day: 1.00    Years: 55.00    Pack years: 55.00    Types: Cigarettes  . Smokeless tobacco: Never Used  Substance Use Topics  . Alcohol use: Yes    Comment: 2 beers a night  . Drug use: Never    Home Medications Prior to Admission medications   Medication Sig Start Date End Date Taking? Authorizing Provider  amitriptyline (ELAVIL) 10 MG tablet Take 1 tablet by mouth 2 (two) times daily. 07/31/19  Yes [provider]  escitalopram (LEXAPRO) 20 MG tablet Take 1 tablet by mouth daily. 07/31/19  Yes [provider]  gabapentin  (NEURONTIN) 300 MG capsule Take 1 capsule by mouth 3 (three) times daily. 07/28/19  Yes [provider]  oxyCODONE-acetaminophen (PERCOCET/ROXICET) 5-325 MG tablet Take 1 tablet by mouth 3 (three) times daily.   Yes [provider]  predniSONE (DELTASONE) 10 MG tablet Take 10 mg by mouth.   Yes [provider]  senna-docusate (SENOKOT-S) 8.6-50 MG tablet Take 1 tablet by mouth daily.   Yes [provider]  calcitonin, salmon, (MIACALCIN/FORTICAL) 200 UNIT/ACT nasal spray Place 1 spray into alternate nostrils daily.    [provider]  calcium carbonate (OS-CAL) 600 MG TABS tablet Take 600 mg by mouth 2 (two) times daily with a meal.    [provider]  diclofenac Sodium (VOLTAREN) 1 % GEL Apply 1 g topically 3 (three) times daily.    [provider]  Lidocaine HCl-Benzyl Alcohol (SALONPAS LIDOCAINE PLUS EX) Apply topically.    [provider]    Allergies    Penicillin g  Review of Systems   Review of Systems  Constitutional: Negative for unexpected weight change.  HENT: Negative for congestion.   Eyes: Negative for visual disturbance.  Respiratory: Negative for shortness of breath.   Cardiovascular: Negative for chest pain.  Gastrointestinal: Negative for abdominal pain and vomiting.  Genitourinary: Positive for difficulty urinating, frequency and hematuria.  Musculoskeletal: Negative for  neck stiffness.  Skin: Negative for rash.  Neurological: Negative for dizziness.  Psychiatric/Behavioral: Negative for agitation.  All other systems reviewed and are negative.   Physical Exam Updated Vital Signs BP (!) 168/76   Pulse 67   Temp 98.3 F (36.8 C) (Oral)   Resp 16   Ht 5' 8.75" (1.746 m)   Wt 45.4 kg   SpO2 95%   BMI 14.88 kg/m   Physical Exam Vitals and nursing note reviewed.  Constitutional:      General: She is not in acute distress.    Appearance: Normal appearance.  HENT:     Head: Normocephalic  and atraumatic.     Nose: Nose normal.  Eyes:     Conjunctiva/sclera: Conjunctivae normal.     Pupils: Pupils are equal, round, and reactive to light.  Cardiovascular:     Rate and Rhythm: Normal rate and regular rhythm.     Pulses: Normal pulses.     Heart sounds: Normal heart sounds.  Pulmonary:     Effort: Pulmonary effort is normal.     Breath sounds: Normal breath sounds.  Abdominal:     General: Abdomen is flat. Bowel sounds are normal.     Palpations: Abdomen is soft.     Tenderness: There is no abdominal tenderness. There is no guarding or rebound.  Musculoskeletal:        General: Normal range of motion.     Cervical back: Normal range of motion and neck supple.  Skin:    General: Skin is warm and dry.     Capillary Refill: Capillary refill takes less than 2 seconds.  Neurological:     General: No focal deficit present.     Mental Status: She is alert.     Deep Tendon Reflexes: Reflexes normal.  Psychiatric:        Mood and Affect: Mood normal.        Behavior: Behavior normal.     ED Results / Procedures / Treatments   Labs (all labs ordered are listed, but only abnormal results are displayed) Labs Reviewed  URINALYSIS, ROUTINE W REFLEX MICROSCOPIC - Abnormal; Notable for the following components:      Result Value   Color, Urine RED (*)    APPearance TURBID (*)    Glucose, UA   (*)    Value: TEST NOT REPORTED DUE TO COLOR INTERFERENCE OF URINE PIGMENT   Hgb urine dipstick   (*)    Value: TEST NOT REPORTED DUE TO COLOR INTERFERENCE OF URINE PIGMENT   Bilirubin Urine   (*)    Value: TEST NOT REPORTED DUE TO COLOR INTERFERENCE OF URINE PIGMENT   Ketones, ur   (*)    Value: TEST NOT REPORTED DUE TO COLOR INTERFERENCE OF URINE PIGMENT   Protein, ur   (*)    Value: TEST NOT REPORTED DUE TO COLOR INTERFERENCE OF URINE PIGMENT   Nitrite   (*)    Value: TEST NOT REPORTED DUE TO COLOR INTERFERENCE OF URINE PIGMENT   Leukocytes,Ua   (*)    Value: TEST NOT REPORTED  DUE TO COLOR INTERFERENCE OF URINE PIGMENT   All other components within normal limits  CBC WITH DIFFERENTIAL/PLATELET - Abnormal; Notable for the following components:   WBC 17.2 (*)    Hemoglobin 15.2 (*)    HCT 46.6 (*)    Neutro Abs 15.8 (*)    Abs Immature Granulocytes 0.09 (*)    All other components within normal limits  COMPREHENSIVE METABOLIC PANEL - Abnormal; Notable for the following components:   Sodium 134 (*)    Chloride 93 (*)    Glucose, Bld 163 (*)    BUN 26 (*)    Creatinine, Ser 1.55 (*)    Total Protein 8.8 (*)    AST 43 (*)    Alkaline Phosphatase 137 (*)    GFR, Estimated 35 (*)    Anion gap 16 (*)    All other components within normal limits  URINALYSIS, MICROSCOPIC (REFLEX) - Abnormal; Notable for the following components:   Bacteria, UA FEW (*)    All other components within normal limits  RESP PANEL BY RT-PCR (FLU A&B, COVID) ARPGX2  CULTURE, BLOOD (ROUTINE X 2)  CULTURE, BLOOD (ROUTINE X 2)    EKG EKG Interpretation  Date/Time:  Wednesday November 13 2020 02:23:00 EST Ventricular Rate:  69 PR Interval:    QRS Duration: 149 QT Interval:  450 QTC Calculation: 483 R Axis:   98 Text Interpretation: Sinus rhythm Biatrial enlargement RBBB and LPFB Baseline wander in lead(s) V6 Confirmed by Randal Buba, Tadan Shill ([id number]) on 11/13/2020 2:49:01 AM   Radiology CT Soft Tissue Neck W Contrast  Result Date: 11/13/2020 CLINICAL DATA:  Esophageal pain EXAM: CT NECK WITH CONTRAST TECHNIQUE: Multidetector CT imaging of the neck was performed using the standard protocol following the bolus administration of intravenous contrast. CONTRAST:  71mL OMNIPAQUE IOHEXOL 300 MG/ML  SOLN COMPARISON:  None. FINDINGS: Pharynx and larynx: There is medialization of the left vocal fold with dilatation of the left piriform sinus. The larynx and pharynx are otherwise normal. Normal epiglottis. No retropharyngeal abnormality. Esophagus is patulous. There is layering contrast material  visible within the upper thoracic esophagus. Salivary glands: No inflammation, mass, or stone. Thyroid: Normal. Lymph nodes: None enlarged or abnormal density. Vascular: Calcific aortic atherosclerosis. Limited intracranial: Normal Visualized orbits: Normal Mastoids and visualized paranasal sinuses: Clear. Skeleton: No acute or aggressive process. Upper chest: Please see dedicated report for CT chest. Other: None. IMPRESSION: 1. Medialization of the left vocal fold with dilatation of the left piriform sinus, suggesting left vocal cord paralysis. 2. Patulous esophagus. Aortic Atherosclerosis (ICD10-I70.0). Electronically Signed   By: Ulyses Jarred M.D.   On: 11/13/2020 01:51   CT Chest W Contrast  Result Date: 11/13/2020 CLINICAL DATA:  Chest pain, shortness of breath, incontinent urine, hematuria, abnormal abdominal CT EXAM: CT CHEST WITH CONTRAST TECHNIQUE: Multidetector CT imaging of the chest was performed during intravenous contrast administration. CONTRAST:  1mL OMNIPAQUE IOHEXOL 300 MG/ML  SOLN COMPARISON:  Same-day CT abdomen and pelvis, CT chest 10/05/2019 FINDINGS: Cardiovascular: Cardiac size top-normal to mildly enlarged. Mild compression of the right heart by a chronic chest wall deformity. Calcifications of the mitral annulus and minimally upon the aortic leaflets. Coronary artery calcifications are present as well. No pericardial effusion. No large central or lobar pulmonary artery filling defects within the limitations of this unenhanced CT. Surgical truncation of left hilar vessels, likely related to prior lobectomy. Atherosclerotic plaque within the normal caliber aorta. No acute luminal abnormality of the imaged aorta. No periaortic stranding or hemorrhage. Normal 3 vessel branching of the aortic arch. Proximal great vessels are mildly calcified but otherwise unremarkable. Mediastinum/Nodes: High attenuation enteric contrast media was administered with the patient on the table for assessment  of the thoracic esophagus. There is circumferential thickening of the distal thoracic esophagus and to a lesser extent the GE junction however, there is no extravasation of the enteric contrast media nor extraluminal free  air to suggest esophageal perforation. Some minimal adjacent stranding is likely reactive. Fluid within the pericardial recesses. No free mediastinal fluid or gas. No organized collection or abscess. Normal thyroid gland and thoracic inlet. No acute abnormality of the trachea or esophagus. No worrisome mediastinal, hilar or axillary adenopathy. Lungs/Pleura: Postsurgical changes in the lungs likely reflecting a prior left lobectomy, correlate with surgical history. There is a small chronic left pleural effusion with some minimal pleural thickening and adjacent atelectasis, not significantly changed from comparison exam. There is increasingly conspicuous spiculated foci in the medial left lung apex measuring now 9 x 17 mm, previously 7 x 11 mm (3/25) additional scarring, retraction architectural distortion is seen involving a spiculated nodule previously seen in the right lung apex (3/27 with changes now extending to the pleural surface measuring 2.5 x 2.2 cm in transaxial dimension. Some additional bandlike scarring is seen in the posterior segment right upper lobe (3/60). New sub solid, subpleural nodule measuring 12.5 x 8 mm (3/116) in the periphery of the right lower lobe. No other concerning pulmonary nodules or masses. Background of centrilobular emphysematous change and chronic bronchitic features. Upper Abdomen: Vascular collaterals in the upper abdomen about the esophagus and GE junction. Mild thickening near the gastric cardia, contiguous with esophageal thickening above. For additional findings please see same day CT of the abdomen and pelvis. Musculoskeletal: Chest wall asymmetry, possibly postsurgical in nature. Paucity of subcutaneous fat, correlate with nutritional status. Multilevel  degenerative changes are present in the imaged portions of the spine. Interval vertebroplasty changes involving the T6, T7 vertebrae, unchanged remote vertebral body height loss T10. Age indeterminate though possibly acute on chronic T11 vertebral plana deformity given some adjacent paraspinal thickening. No other acute or conspicuous osseous lesions. IMPRESSION: 1. Circumferential thickening of the distal thoracic esophagus and gastric cardia. No extravasation of administered enteric contrast media nor extraluminal gas to suggest perforation. Minimal adjacent stranding may be merely reactive. 2. Postsurgical changes in the left lung likely reflecting a prior left lower lobectomy, correlate with surgical history. Chronic left basilar effusion and pleural thickening is grossly similar to prior. 3. Increasingly conspicuous spiculated foci in the medial left lung apex measuring now 9 x 17 mm, previously 7 x 11 mm. Additional scarring, retraction architectural distortion is seen involving a spiculated nodule previously seen in the right lung apex with changes now extending to the pleural surface measuring 2.5 x 2.2 cm in transaxial dimension. Findings are concerning for possible malignancy. 4. New sub solid, subpleural nodule measuring 12.5 x 8 mm in the periphery of the right lower lobe. This could reflect an infectious or inflammatory process versus a metastatic lesion. 5. Interval vertebroplasty changes involving the T6, T7 vertebrae. Stable remote compression deformity T10. 6. Possible acute on chronic T11 vertebral plana deformity given some adjacent paraspinal thickening. Correlate for point tenderness. 7. Chronic chest wall asymmetry. 8. Paucity of subcutaneous fat, correlate with nutritional status. 9. Aortic Atherosclerosis (ICD10-I70.0) 10. Emphysema (ICD10-J43.9) and chronic bronchitic changes. Electronically Signed   By: Lovena Le M.D.   On: 11/13/2020 01:57   CT Renal Stone Study  Result Date:  11/13/2020 CLINICAL DATA:  Hematuria, unknown cause EXAM: CT ABDOMEN AND PELVIS WITHOUT CONTRAST TECHNIQUE: Multidetector CT imaging of the abdomen and pelvis was performed following the standard protocol without IV contrast. COMPARISON:  CT chest 10/05/2019 FINDINGS: Lower chest: Emphysematous changes noted in the lung bases. Bandlike opacity in the lung bases likely reflect areas of scarring. Right lung is otherwise relatively clear.  There is a trace left pleural effusion and atelectatic change with some questionable pleural thickening, incompletely assessed on noncontrast exam. Thickened and fluid-filled distal esophagus is noted. Cardiac size within normal limits with slight compression of the right heart by the chest wall deformity. Hypoattenuation of the cardiac blood pool may reflect a mild anemia. Hepatobiliary: No visible focal liver lesion within limitations of this unenhanced CT gallbladder contains some dependently layering attenuation which may reflect biliary sludge. No pericholecystic fluid or inflammation. No biliary ductal dilatation or intraductal gallstones are seen. Pancreas: There is fatty infiltration of the pancreas, particularly towards the pancreatic head and uncinate. No clear peripancreatic inflammation or pancreatic ductal dilatation. Spleen: Normal in size. No concerning splenic lesions. Adrenals/Urinary Tract: Normal adrenal glands. Slight inferior positioning of the right kidney may related to normal anatomic variance or chest wall deformity. No visible concerning renal lesion. No urolithiasis or hydronephrosis. Mild symmetric bilateral perinephric stranding, a nonspecific finding which may correlate with advanced age or decreased renal function. Bladder is largely decompressed though wall thickening and hazy perivesicular stranding is more conspicuous than mere underdistention. Additionally, there is some questionable asymmetric thickening on the left posterolateral aspect of the  bladder wall and some layering hyperdensity in the urinary bladder lumen concerning for hemorrhagic products/clot given hematuria. Stomach/Bowel: Irregular thickening of the fluid-filled distal thoracic esophagus with adjacent inflammatory stranding lower mediastinum. Extensive paraesophageal and gastric vascular collaterals are noted as well. Distal stomach and duodenum are unremarkable. No small bowel thickening or dilatation. Moderate to large colonic stool burden. Normal appendix. No evidence of bowel obstruction. Vascular/Lymphatic: Upper abdominal vascular collaterals. Atherosclerotic calcifications within the abdominal aorta and branch vessels. No aneurysm or ectasia. No enlarged abdominopelvic lymph nodes. Reproductive: Retroverted uterus.  No concerning adnexal lesions. Other: No abdominopelvic free fluid or free gas. No bowel containing hernias. Musculoskeletal: Chest wall deformity, levocurvature of the lumbar spine, remote appearing incomplete burst fracture involving the superior endplate L5 with slight retropulsion, in combination with severe facet degenerative changes and ligamentum flavum infolding there is resulting severe canal stenosis at the L4-5 level. Additional moderate to severe stenoses present L3-4 as well multilevel moderate to severe neural foraminal narrowing is seen throughout the lumbar levels. The overall sclerotic appearance of the L5 and S1 vertebral bodies is favored to be on a Modic type bases given the absence of adjacent inflammation, fluid collections or other features to suggest infection. IMPRESSION: 1. Bladder is largely decompressed though wall thickening and hazy perivesicular stranding is more conspicuous than mere underdistention. Additionally, there is some questionable asymmetric thickening on the left posterolateral aspect of the bladder wall and some layering hyperdensity in the urinary bladder lumen concerning for hemorrhagic products/clot given hematuria. Recommend  further evaluation with urinalysis and cystoscopy. 2. Mild symmetric bilateral perinephric stranding, a nonspecific finding which may correlate with advanced age or decreased renal function though given bladder findings, ascending tract infection is not excluded. 3. Chronic distal esophageal thickening with some adjacent hazy stranding in the lower mediastinum. Correlate for esophageal symptoms and results of prior endoscopy if performed, otherwise consider direct visualization. 4. Left pleural effusion and atelectatic change with some questionable pleural thickening, incompletely assessed on noncontrast exam. While this could feasibly be related to the esophageal process, the appearance is fairly similar to comparison study from 2020 and could reflect some chronic change. 5. Extensive paraesophageal and gastric vascular collaterals. 6. Remote appearing incomplete burst fracture involving the superior endplate L5 with slight retropulsion, in combination with severe facet degenerative changes  and ligamentum flavum infolding there is resulting severe canal stenosis at the L4-5 level. Additional moderate to severe stenoses present L3-4 as well as multilevel moderate to severe neural foraminal narrowing throughout the lumbar levels. 7. Hypoattenuation of the cardiac blood pool may reflect a mild anemia. 8. Aortic Atherosclerosis (ICD10-I70.0) 9. Emphysema (ICD10-J43.9). These results were called by telephone at the time of interpretation on 11/13/2020 at 12:11 am to provider Tegeler, who verbally acknowledged these results. Electronically Signed   By: Lovena Le M.D.   On: 11/13/2020 00:10    Procedures Procedures (including critical care time)  Medications Ordered in ED Medications  0.9 %  sodium chloride infusion ( Intravenous New Bag/Given 11/13/20 0239)  iohexol (OMNIPAQUE) 300 MG/ML solution 100 mL (60 mLs Intravenous Contrast Given 11/13/20 0127)  ciprofloxacin (CIPRO) IVPB 400 mg (0 mg Intravenous  Stopped 11/13/20 0347)    ED Course  I have reviewed the triage vital signs and the nursing notes.  Pertinent labs & imaging results that were available during my care of the patient were reviewed by me and considered in my medical decision making (see chart for details).    no historical signs of booerhaave syndrome.  I suspect the hematuria is related to ascending UTI.  But given bony lesions and lung lesion cancer is also in the differential.  Patient will need admission for UTI and further work up.   Final Clinical Impression(s) / ED Diagnoses Final diagnoses:  Hematuria, unspecified type  Lower urinary tract infectious disease  Esophagitis  Lung mass    Admit to medicine    Jaeven Wanzer, MD 11/13/20 7001

## 2020-11-13 NOTE — Progress Notes (Signed)
Pt admitted to 2w18. Bp elevated and complaining of chest pressure 5/10 on pain scale. Stat EKG obtained and MD paged for new orders. Chest pressure subsided on it's own prior to MD return call.   0602: Dr. Lara Mulch returned call and updated on pt's status. See new orders. Will continue to monitor pt.

## 2020-11-14 ENCOUNTER — Inpatient Hospital Stay (HOSPITAL_COMMUNITY): Payer: Medicare Other

## 2020-11-14 LAB — CBC
HCT: 35.5 % — ABNORMAL LOW (ref 36.0–46.0)
Hemoglobin: 12.2 g/dL (ref 12.0–15.0)
MCH: 33 pg (ref 26.0–34.0)
MCHC: 34.4 g/dL (ref 30.0–36.0)
MCV: 95.9 fL (ref 80.0–100.0)
Platelets: 237 10*3/uL (ref 150–400)
RBC: 3.7 MIL/uL — ABNORMAL LOW (ref 3.87–5.11)
RDW: 13.8 % (ref 11.5–15.5)
WBC: 7.6 10*3/uL (ref 4.0–10.5)
nRBC: 0 % (ref 0.0–0.2)

## 2020-11-14 LAB — BASIC METABOLIC PANEL
Anion gap: 12 (ref 5–15)
BUN: 12 mg/dL (ref 8–23)
CO2: 26 mmol/L (ref 22–32)
Calcium: 9.1 mg/dL (ref 8.9–10.3)
Chloride: 101 mmol/L (ref 98–111)
Creatinine, Ser: 0.83 mg/dL (ref 0.44–1.00)
GFR, Estimated: >60 mL/min (ref >60)
Glucose, Bld: 107 mg/dL — ABNORMAL HIGH (ref 70–99)
Potassium: 3.8 mmol/L (ref 3.5–5.1)
Sodium: 139 mmol/L (ref 135–145)

## 2020-11-14 MED ORDER — VENLAFAXINE HCL ER 75 MG PO CP24: 75.0000 mg | ORAL_CAPSULE | Freq: Every day | ORAL | Status: DC

## 2020-11-14 MED ORDER — VENLAFAXINE HCL ER 75 MG PO CP24
112.5000 mg | ORAL_CAPSULE | Freq: Every day | ORAL | Status: DC
  Filled 2020-11-14: qty 1

## 2020-11-14 MED ORDER — VENLAFAXINE HCL ER 75 MG PO CP24
112.5000 mg | ORAL_CAPSULE | Freq: Every day | ORAL | Status: DC
  Administered 2020-11-14: 112.5 mg via ORAL
  Filled 2020-11-14 (×2): qty 1

## 2020-11-14 MED ORDER — VENLAFAXINE HCL ER 37.5 MG PO CP24: 37.5000 mg | ORAL_CAPSULE | Freq: Every day | ORAL | Status: DC

## 2020-11-14 NOTE — Progress Notes (Signed)
PROGRESS NOTE    Mariah Bruce  IDP:[id number] DOB: November 20, 1945 DOA: 11/12/2020 PCP: Girtha Rm, NP-C    Brief Narrative:  Mrs. Mariah Bruce was admitted to the hospital with working diagnosis of urinary tract infection, complicated by hematuria and acute kidney injury on chronic kidney disease stage IIIb.  75 year old female past medical history for lung cancer, COPD, anxiety, depression, GERD and tobacco abuse, who presented with bloody urine.  She reported 2 days of dark urine, initially brown then bright red with blood clots.  Positive middle back pain radiated to the anterior abdomen. On her initial physical examination her blood pressure was 95/65-180/97, heart rate 69, respiratory rate 18, temperature 98.1, oxygen saturation 95%, she had moist mucous membranes, her lungs are clear to auscultation bilaterally, heart S1-S2, persistent rhythmic, abdomen mild distention, no lower extremity edema.  Sodium 134, potassium 4.8, chloride 93, bicarb 25, glucose 163, BUN 26, creatinine 1.55, white count 17.2, hemoglobin 15.2, hematocrit 46.6, platelets 380. SARS COVID-19 negative. Urinalysis turbid urine, > 50 red cells, 6-10 white cells.  CT renal stone study with bladder largely decompressed through wall thickening and hazy perivesicular stranding.  Questionable asymmetric thickening of the left posterior lateral aspect of the bladder wall and some layering hyperdensity in the urinary bladder lumen concerning for hemorrhagic products/clot. Mild symmetric bilateral perinephric stranding. Chronic distal esophageal thickening.  Chest radiograph with hyperinflation, right apical density. EKG 69 bpm, rightward axis, right bundle branch block, sinus rhythm, q wave, lead II, lead III, aVF, no significant ST segment or T wave changes.  A Foley catheter was placed and she received intravenous fluids along with intravenous antibiotic therapy. Urology recommended to complete course of antibiotic  therapy for urinary tract infection and follow-up as an outpatient.  Assessment & Plan:   Principal Problem:   Urinary tract infection with hematuria Active Problems:   Acute kidney injury ([place])   Tobacco abuse   Lung cancer ([place])   Underweight   Acute metabolic encephalopathy   1. Urinary tract infection complicated with hematuria. Hgb is stable at 12.2, wbc is 7.6.  Urine is clear with no signs of macroscopic hematuria.   Follow on urine culture, will dc foley catheter and continue antibiotic therapy with Ceftriaxone.  Follow up with Urology as outpatient in [place].   2. AKI, Stable renal function with serum cr at 0,83 K at 3,8 and bicarbonate at 26.  3. Lung cancer.  diagnosed on 2014.  CT chest with spiculated lesion within the: Left lung apex 9 to 17 mm.  Right lung apex spiculated lesion now extending to the pleural surface measuring 2,5 x 2,2 cm.  Right lower lobe subpleural nodule 12,5 x 8 mm.   Progressive diease. Patient not willing to get any further treatment or workup. Consulted nutrition for evaluation    4. Acute metabolic encephalopathy. Likely multifactorial. Patient is awake and alert. No confusion or agitation. Head Ct with no acute changes.  Consult PT and OT.   5. Chronic back pain. Continue pain control with analgesics. Discontinue dexamethasone for now.   6. GERD. Continue famotidine.    7. Depression. Continue with amitriptyline, venlafaxine and as needed lorazepam.    Status is: Inpatient  Remains inpatient appropriate because:IV treatments appropriate due to intensity of illness or inability to take PO   Dispo: The patient is from: Home              Anticipated d/c is to: Home  Anticipated d/c date is: 1 day              Patient currently is not medically stable to d/c.   DVT prophylaxis: Enoxaparin   Code Status:   full  Family Communication:  I spoke with patient's sister at the bedside, we talked in detail about  patient's condition, plan of care and prognosis and all questions were addressed.       Antimicrobials:   Ceftriaxone     Subjective: Patient feeling very weak and deconditioned, not yet back to her baseline, she has a foley catheter in place with no signs of hematuria, no abdominal pain, no nausea or vomiting.   Objective: Vitals:   11/13/20 0556 11/13/20 1504 11/13/20 1940 11/14/20 0509  BP: (!) 180/97 (!) 153/67 131/65 (!) 147/84  Pulse: 71 85 (!) 108 78  Resp: 18 18 20 18   Temp: 98.1 F (36.7 C) 98.7 F (37.1 C) 98.5 F (36.9 C) 98 F (36.7 C)  TempSrc: Oral Oral Oral Oral  SpO2: 96% 97% 98% 94%  Weight:      Height:        Intake/Output Summary (Last 24 hours) at 11/14/2020 1047 Last data filed at 11/14/2020 0093 Gross per 24 hour  Intake 1545.81 ml  Output 3550 ml  Net -2004.19 ml   Filed Weights   11/12/20 1716  Weight: 45.4 kg    Examination:   General: Not in pain or dyspnea, deconditioned  Neurology: Awake and alert, non focal  E ENT: mild pallor, no icterus, oral mucosa moist Cardiovascular: No JVD. S1-S2 present, rhythmic, no gallops, rubs, or murmurs. No lower extremity edema. Pulmonary: positive breath sounds bilaterally with no wheezing, rhonchi or rales. Gastrointestinal. Abdomen soft and non tender Skin. No rashes Musculoskeletal: no joint deformities     Data Reviewed: I have personally reviewed following labs and imaging studies  CBC: Recent Labs  Lab 11/12/20 2022 11/13/20 0613 11/14/20 0458  WBC 17.2* 9.9 7.6  NEUTROABS 15.8*  --   --   HGB 15.2* 12.1 12.2  HCT 46.6* 36.2 35.5*  MCV 99.4 98.6 95.9  PLT 380 237 818   Basic Metabolic Panel: Recent Labs  Lab 11/12/20 2022 11/13/20 0613 11/14/20 0458  NA 134* 136 139  K 4.8 3.9 3.8  CL 93* 99 101  CO2 25 24 26   GLUCOSE 163* 77 107*  BUN 26* 19 12  CREATININE 1.55* 1.19* 0.83  CALCIUM 10.1 9.3 9.1   GFR: Estimated Creatinine Clearance: 42.6 mL/min (by C-G formula  based on SCr of 0.83 mg/dL). Liver Function Tests: Recent Labs  Lab 11/12/20 2022  AST 43*  ALT 31  ALKPHOS 137*  BILITOT 0.5  PROT 8.8*  ALBUMIN 4.9   No results for input(s): LIPASE, AMYLASE in the last 168 hours. No results for input(s): AMMONIA in the last 168 hours. Coagulation Profile: Recent Labs  Lab 11/13/20 0753  INR 1.1   Cardiac Enzymes: No results for input(s): CKTOTAL, CKMB, CKMBINDEX, TROPONINI in the last 168 hours. BNP (last 3 results) No results for input(s): PROBNP in the last 8760 hours. HbA1C: No results for input(s): HGBA1C in the last 72 hours. CBG: No results for input(s): GLUCAP in the last 168 hours. Lipid Profile: No results for input(s): CHOL, HDL, LDLCALC, TRIG, CHOLHDL, LDLDIRECT in the last 72 hours. Thyroid Function Tests: No results for input(s): TSH, T4TOTAL, FREET4, T3FREE, THYROIDAB in the last 72 hours. Anemia Panel: No results for input(s): VITAMINB12, FOLATE, FERRITIN, TIBC, IRON,  RETICCTPCT in the last 72 hours.    Radiology Studies: I have reviewed all of the imaging during this hospital visit personally     Scheduled Meds: . amitriptyline  10 mg Oral QHS  . Chlorhexidine Gluconate Cloth  6 each Topical Daily  . dexamethasone  4 mg Oral Daily  . famotidine  40 mg Oral Daily  . gabapentin  300 mg Oral Daily   And  . gabapentin  600 mg Oral QHS  . senna-docusate  2 tablet Oral BID  . sodium chloride flush  3 mL Intravenous Q12H  . venlafaxine XR  112.5 mg Oral q1800   Continuous Infusions: . cefTRIAXone (ROCEPHIN)  IV 1 g (11/14/20 0820)     LOS: 1 day        Steed Kanaan Gerome Apley, MD

## 2020-11-14 NOTE — Plan of Care (Signed)

## 2020-11-14 NOTE — Evaluation (Signed)
Physical Therapy Evaluation Patient Details Name: Mariah Bruce MRN: [id number] DOB: May 22, 1946 Today's Date: 11/14/2020   History of Present Illness  Pt is 75 yo female with PMH including lung cancer, COPD, anxiety, depression, GERD and tobacco abuse.  She presented with bloody urine and has been admitted with working diagnosis of UTI complicated by hematuria and AKI.  Clinical Impression   Pt admitted with above diagnosis.  Pt was able to demonstrate independent transfers and ambulation with supervision.  Pt is normally independent but reports she does occasionally hold onto furniture and walls with ambulation "since it is available."  She was able to ambulate 320' in hall without UE support.  At discharge, pt is going to her sister's house where she will have 24 hr support prior to returning to her home in [place].   Pt has no further acute PT needs - she is at her baseline.  Pt safe to mobilize in room with family from PT perspective.     Follow Up Recommendations No PT follow up;Supervision - Intermittent    Equipment Recommendations  None recommended by PT    Recommendations for Other Services       Precautions / Restrictions Precautions Precautions: None      Mobility  Bed Mobility Overal bed mobility: Independent                  Transfers Overall transfer level: Needs assistance   Transfers: Sit to/from Stand Sit to Stand: Supervision         General transfer comment: Slow to rise but reports this is her normal  Ambulation/Gait Ambulation/Gait assistance: Min guard;Supervision Gait Distance (Feet): 320 Feet Assistive device: None Gait Pattern/deviations: Step-through pattern Gait velocity: normal   General Gait Details: Min guard progressed to supervision. Pt did have waviering gait pattern 1-2 times but no LOB and was able to correct.  Reports this is her normal gait.  Stairs            Wheelchair Mobility    Modified Rankin (Stroke  Patients Only)       Balance Overall balance assessment: Needs assistance;Independent   Sitting balance-Leahy Scale: Normal       Standing balance-Leahy Scale: Good               High level balance activites: Side stepping;Direction changes;Turns;Head turns;Sudden stops               Pertinent Vitals/Pain Pain Assessment: No/denies pain    Home Living Family/patient expects to be discharged to:: Private residence Living Arrangements: Other (Comment) (Pt lives in [place] but visiting sister for a month here in [place].) Available Help at Discharge: Family;Available PRN/intermittently Type of Home: House Home Access: Stairs to enter Entrance Stairs-Rails: None Entrance Stairs-Number of Steps: 2 Home Layout: One level Home Equipment: Walker - 2 wheels Additional Comments: Information above is sister's house where she will be staying at discharge    Prior Function Level of Independence: Independent         Comments: Pt normally lives in [place] with spouse, child, and grandchild and is completely independent but does not drive. Does report she get fatigued with community ambulation. Also, states she holds onto furniture/walls at times.  Reports 1 fall going up steps, otherwise no other falls     Hand Dominance        Extremity/Trunk Assessment   Upper Extremity Assessment Upper Extremity Assessment: Overall WFL for tasks assessed    Lower Extremity Assessment Lower Extremity Assessment:  Overall [facility] for tasks assessed    Cervical / Trunk Assessment Cervical / Trunk Assessment: Kyphotic;Other exceptions Cervical / Trunk Exceptions: Pt very thin and kyphotic - she does have some redness on thoracic spine.  Sister asking about how to prevent.  Discussed position change/pressure relief every 30 mins.  Communication   Communication: No difficulties  Cognition Arousal/Alertness: Awake/alert Behavior During Therapy: WFL for tasks  assessed/performed Overall Cognitive Status: Within Functional Limits for tasks assessed                                        General Comments      Exercises     Assessment/Plan    PT Assessment Patent does not need any further PT services  PT Problem List         PT Treatment Interventions      PT Goals (Current goals can be found in the Care Plan section)  Acute Rehab PT Goals Patient Stated Goal: return home to sister's house PT Goal Formulation: All assessment and education complete, DC therapy    Frequency     Barriers to discharge        Co-evaluation               AM-PAC PT "6 Clicks" Mobility  Outcome Measure Help needed turning from your back to your side while in a flat bed without using bedrails?: None Help needed moving from lying on your back to sitting on the side of a flat bed without using bedrails?: None Help needed moving to and from a bed to a chair (including a wheelchair)?: None Help needed standing up from a chair using your arms (e.g., wheelchair or bedside chair)?: None Help needed to walk in hospital room?: None Help needed climbing 3-5 steps with a railing? : A Little 6 Click Score: 23    End of Session   Activity Tolerance: Patient tolerated treatment well Patient left: in bed;with call bell/phone within reach;with family/visitor present Nurse Communication: Mobility status PT Visit Diagnosis: Other abnormalities of gait and mobility (R26.89)    Time: 3382-5053 PT Time Calculation (min) (ACUTE ONLY): 18 min   Charges:   PT Evaluation $PT Eval Low Complexity: 1 Low          Manjit Bufano, PT Acute Rehab Services Pager [phone] [facility] [phone]    Karlton Lemon 11/14/2020, 4:13 PM

## 2020-11-14 NOTE — Progress Notes (Signed)
Initial Nutrition Assessment  DOCUMENTATION CODES:   Underweight,Severe malnutrition in context of chronic illness  INTERVENTION:   Liberalize diet to REGULAR   Magic cup TID with meals, each supplement provides 290 kcal and 9 grams of protein  Double protein portions with meals   MVI daily   NUTRITION DIAGNOSIS:   Severe Malnutrition related to chronic illness (COPD/progressive lung cancer?) as evidenced by severe fat depletion,severe muscle depletion,energy intake < or equal to 75% for > or equal to 1 month.  GOAL:   Patient will meet greater than or equal to 90% of their needs  MONITOR:   PO intake,Supplement acceptance,Weight trends,I & O's,Labs  REASON FOR ASSESSMENT:   Other (Comment) (low BMI)    ASSESSMENT:   Patient with PMH significant for lung cancer s/p surgery, COPD, GERD, and tobacco abuse. Presents this admission with UTI.   Of note had previous diagnosis of lung cancer. Underwent removal but did not receive chemo/radiation. CT this admission showed L/R lesion and R lung nodule. Suspect progression of this disease is playing a role in malnutrition.   Pt denies loss in appetite PTA and states she has never been a "big eat". States she consumes three meals daily that consist of B-fruit, yogurt L- half of a deli sandwich D- meat, vegetable, grain. States portions are always very small. Started to notice difficulty swallowing food a few years ago. Went to see GI which recommended EGD but pt did not want to pursue treatments. She has to take really small bites and chew food well. Discussed the importance of protein intake for preservation of lean body mass. Pt does not wish to have Ensure or Boost but willing to try magic cup.   Pt endorses a UBW of 115-120 lb and is unsure of recent wt loss. States three years ago when she fractured her back she lost a significant amount of wt and has been unable to gain the weight back. Records lack weight documentation over the  last year.   Medications: senokot Labs: CBG 77-163  NUTRITION - FOCUSED PHYSICAL EXAM:  Flowsheet Row Most Recent Value  Orbital Region Severe depletion  Upper Arm Region Severe depletion  Thoracic and Lumbar Region Severe depletion  Buccal Region Severe depletion  Temple Region Severe depletion  Clavicle Bone Region Severe depletion  Clavicle and Acromion Bone Region Severe depletion  Scapular Bone Region Severe depletion  Dorsal Hand Severe depletion  Patellar Region Severe depletion  Anterior Thigh Region Severe depletion  Posterior Calf Region Severe depletion  Edema (RD Assessment) None  Hair Reviewed  Eyes Reviewed  Mouth Reviewed  Skin Reviewed  Nails Reviewed     Diet Order:   Diet Order            Diet Heart Room service appropriate? Yes; Fluid consistency: Thin  Diet effective now                 EDUCATION NEEDS:   Education needs have been addressed  Skin:  Skin Assessment: Reviewed RN Assessment  Last BM:  PTA  Height:   Ht Readings from Last 1 Encounters:  11/12/20 5' 8.75" (1.746 m)    Weight:   Wt Readings from Last 1 Encounters:  11/12/20 45.4 kg   BMI:  Body mass index is 14.88 kg/m.  Estimated Nutritional Needs:   Kcal:  1400-1600 kcal  Protein:  70-85 grams  Fluid:  >/= 1.4 L/day  Mariana Single RD, LDN Clinical Nutrition Pager listed in [place]

## 2020-11-15 DIAGNOSIS — E43 Unspecified severe protein-calorie malnutrition: Secondary | ICD-10-CM

## 2020-11-15 LAB — BASIC METABOLIC PANEL
Anion gap: 10 (ref 5–15)
BUN: 13 mg/dL (ref 8–23)
CO2: 26 mmol/L (ref 22–32)
Calcium: 9.1 mg/dL (ref 8.9–10.3)
Chloride: 102 mmol/L (ref 98–111)
Creatinine, Ser: 0.85 mg/dL (ref 0.44–1.00)
GFR, Estimated: >60 mL/min (ref >60)
Glucose, Bld: 82 mg/dL (ref 70–99)
Potassium: 3.5 mmol/L (ref 3.5–5.1)
Sodium: 138 mmol/L (ref 135–145)

## 2020-11-15 LAB — CBC WITH DIFFERENTIAL/PLATELET
Abs Immature Granulocytes: 0.03 10*3/uL (ref 0.00–0.07)
Basophils Absolute: 0 10*3/uL (ref 0.0–0.1)
Basophils Relative: 1 %
Eosinophils Absolute: 0.1 10*3/uL (ref 0.0–0.5)
Eosinophils Relative: 1 %
HCT: 33.7 % — ABNORMAL LOW (ref 36.0–46.0)
Hemoglobin: 11.6 g/dL — ABNORMAL LOW (ref 12.0–15.0)
Immature Granulocytes: 0 %
Lymphocytes Relative: 27 %
Lymphs Abs: 2.3 10*3/uL (ref 0.7–4.0)
MCH: 33.1 pg (ref 26.0–34.0)
MCHC: 34.4 g/dL (ref 30.0–36.0)
MCV: 96.3 fL (ref 80.0–100.0)
Monocytes Absolute: 0.8 10*3/uL (ref 0.1–1.0)
Monocytes Relative: 9 %
Neutro Abs: 5.3 10*3/uL (ref 1.7–7.7)
Neutrophils Relative %: 62 %
Platelets: 221 10*3/uL (ref 150–400)
RBC: 3.5 MIL/uL — ABNORMAL LOW (ref 3.87–5.11)
RDW: 13.9 % (ref 11.5–15.5)
WBC: 8.4 10*3/uL (ref 4.0–10.5)
nRBC: 0 % (ref 0.0–0.2)

## 2020-11-15 MED ORDER — CITALOPRAM HYDROBROMIDE 20 MG PO TABS: 30.0000 mg | ORAL_TABLET | Freq: Every day | ORAL | 0 refills | Status: DC

## 2020-11-15 MED ORDER — CEPHALEXIN 250 MG PO CAPS: 250.0000 mg | ORAL_CAPSULE | Freq: Three times a day (TID) | ORAL | 0 refills | Status: DC

## 2020-11-15 MED ORDER — VENLAFAXINE HCL ER 37.5 MG PO CP24: 37.5000 mg | ORAL_CAPSULE | Freq: Every day | ORAL | 0 refills | Status: DC

## 2020-11-15 MED ORDER — FAMOTIDINE 40 MG PO TABS
40.0000 mg | ORAL_TABLET | Freq: Every day | ORAL | 0 refills | Status: DC
Start: 2020-11-15 — End: 2020-12-19

## 2020-11-15 MED ORDER — GABAPENTIN 300 MG PO CAPS: 300.0000 mg | ORAL_CAPSULE | ORAL | 0 refills | Status: DC

## 2020-11-15 MED ORDER — VENLAFAXINE HCL ER 75 MG PO CP24: 75.0000 mg | ORAL_CAPSULE | Freq: Every day | ORAL | 0 refills | Status: DC

## 2020-11-15 MED ORDER — AMITRIPTYLINE HCL 10 MG PO TABS: 10.0000 mg | ORAL_TABLET | Freq: Every day | ORAL | 0 refills | Status: DC

## 2020-11-15 MED ORDER — CEPHALEXIN 250 MG PO CAPS
250.0000 mg | ORAL_CAPSULE | Freq: Three times a day (TID) | ORAL | Status: DC
  Administered 2020-11-15: 250 mg via ORAL
  Filled 2020-11-15 (×3): qty 1

## 2020-11-15 NOTE — Discharge Summary (Addendum)
Physician Discharge Summary  Mariah Bruce GYB:[id number] DOB: 02-Feb-1946 DOA: 11/12/2020  PCP: Girtha Rm, NP-C  Admit date: 11/12/2020 Discharge date: 11/15/2020  Admitted From: Home  Disposition:  Home   Recommendations for Outpatient Follow-up and new medication changes:  1. Follow up with Harland Dingwall L NP-C in 7 days.  2. Continue antibiotic therapy with cephalexin for 5 more days.   Home Health: no   Equipment/Devices: no/ walker    Discharge Condition: stable  CODE STATUS: full  Diet recommendation: regular   Brief/Interim Summary: Mariah Bruce was admitted to the hospital with working diagnosis of urinary tract infection, complicated by hematuria and acute kidney injury on chronic kidney disease stage II.  75 year old female past medical history for lung cancer, COPD, anxiety, depression, GERD and tobacco abuse, who presented with bloody urine.  She reported 2 days of dark urine, initially brown then bright red with blood clots.  Positive middle back pain radiated to the anterior abdomen. On her initial physical examination her blood pressure was 95/65-180/97, heart rate 69, respiratory rate 18, temperature 98.1, oxygen saturation 95%, she had moist mucous membranes, her lungs were clear to auscultation bilaterally, heart S1-S2, persistent rhythmic, abdomen mild distention, no lower extremity edema.  Sodium 134, potassium 4.8, chloride 93, bicarb 25, glucose 163, BUN 26, creatinine 1.55, white count 17.2, hemoglobin 15.2, hematocrit 46.6, platelets 380. SARS COVID-19 negative. Urinalysis turbid urine, > 50 red cells, 6-10 white cells.  CT renal stone study with bladder largely decompressed through wall thickening and hazy perivesicular stranding.  Questionable asymmetric thickening of the left posterior lateral aspect of the bladder wall and some layering hyperdensity in the urinary bladder lumen concerning for hemorrhagic products/clot. Mild symmetric bilateral  perinephric stranding. Chronic distal esophageal thickening.  Chest radiograph with hyperinflation, right apical density. EKG 69 bpm, rightward axis, right bundle branch block, sinus rhythm, q wave, lead II, lead III, aVF, no significant ST segment or T wave changes.  A Foley catheter was placed and she received intravenous fluids along with intravenous antibiotic therapy. Urology recommended to complete course of antibiotic therapy for urinary tract infection and follow-up as an outpatient.  1.  Urinary tract infection complicated with hematuria.  Patient responded well to medical therapy, no further hematuria, Foley catheter was discontinued with no complications. Her antibiotic therapy has been changed to cephalexin to continue for 7 more days.  Outpatient follow-up with urology.  2. Acute kidney injury on CKD stage 2.  She received intravenous fluids with improvement of kidney function, at discharge.  At her discharge sodium 138, potassium 3.5, chloride 102, bicarb 26, glucose 82, BUN 13, creatinine 0.85.  3. Acute metabolic encephalopathy.  Likely multifactorial, with supportive medical therapy including intravenous fluids and antibiotics her mentation improved back to her baseline. Further work-up with head CT no acute changes. Physical therapy/Occupational Therapy evaluation was performed, no outpatient follow-up recommended.  4.  Chronic back pain.  Patient was continue with oral analgesics.  5.  GERD.  Continue famotidine.  6.  Depression.  Continue amitriptyline, venlafaxine and as needed lorazepam.  7. Lung cancer.  She has been diagnosed in 2014, she declined any further treatment or work-up. Her chest CT on this admission showed multiple lung nodules: Left apex 9 to 17 mm. Right lung apex extending to the pleural surface 2.5 x 2.2 cm. Right lower lobe subpleural 12.5 x 8 mm.  She continues to decline any further work-up.  8. Severe calorie protein malnutrition.  Continue with nutritional supplements.  Discharge Diagnoses:  Principal Problem:   Urinary tract infection with hematuria Active Problems:   Acute kidney injury (Mariah Bruce)   Tobacco abuse   Lung cancer ([place])   Underweight   Acute metabolic encephalopathy   Protein-calorie malnutrition, severe    Discharge Instructions   Allergies as of 11/15/2020      Reactions   Penicillin G Rash      Medication List    STOP taking these medications   dexamethasone 4 MG tablet Commonly known as: DECADRON     TAKE these medications   amitriptyline 10 MG tablet Commonly known as: ELAVIL Take 1 tablet (10 mg total) by mouth at bedtime.   cephALEXin 250 MG capsule Commonly known as: KEFLEX Take 1 capsule (250 mg total) by mouth every 8 (eight) hours for 5 days.   citalopram 20 MG tablet Commonly known as: CELEXA Take 1.5 tablets (30 mg total) by mouth daily. Taking 1 & 1/2 tab daily = 30 mg   famotidine 40 MG tablet Commonly known as: PEPCID Take 1 tablet (40 mg total) by mouth daily for 30 doses.   gabapentin 300 MG capsule Commonly known as: NEURONTIN Take 1-2 capsules (300-600 mg total) by mouth See admin instructions. Taking 300 mg in the Am and 600 mg at bedtime   ibuprofen 200 MG tablet Commonly known as: ADVIL Take 400 mg by mouth every 6 (six) hours as needed for mild pain.   LORazepam 0.5 MG tablet Commonly known as: ATIVAN Take 0.5-1 mg by mouth every 4 (four) hours as needed for anxiety (Shortness of breath).   oxyCODONE-acetaminophen 5-325 MG tablet Commonly known as: PERCOCET/ROXICET Take 2 tablets by mouth every 4 (four) hours as needed for moderate pain.   senna-docusate 8.6-50 MG tablet Commonly known as: Senokot-S Take 2 tablets by mouth 2 (two) times daily.   venlafaxine XR 37.5 MG 24 hr capsule Commonly known as: EFFEXOR-XR Take 1 capsule (37.5 mg total) by mouth daily. Taking with 75 mg daily = 112.5 mg   venlafaxine XR 75 MG 24 hr capsule Commonly  known as: EFFEXOR-XR Take 1 capsule (75 mg total) by mouth daily with breakfast. Taking with 37.5 mg daily = 112.5 mg       Allergies  Allergen Reactions  . Penicillin G Rash    Consultations: Urology phone    Procedures/Studies: CT HEAD WO CONTRAST  Result Date: 11/14/2020 CLINICAL DATA:  Altered mental status. EXAM: CT HEAD WITHOUT CONTRAST TECHNIQUE: Contiguous axial images were obtained from the base of the skull through the vertex without intravenous contrast. COMPARISON:  None. FINDINGS: Brain: Mild chronic ischemic white matter disease is noted. No mass effect or midline shift is noted. Ventricular size is within normal limits. There is no evidence of mass lesion, hemorrhage or acute infarction. Vascular: No hyperdense vessel or unexpected calcification. Skull: Normal. Negative for fracture or focal lesion. Sinuses/Orbits: No acute finding. Other: None. IMPRESSION: Mild chronic ischemic white matter disease. No acute intracranial abnormality seen. Electronically Signed   By: Marijo Conception M.D.   On: 11/14/2020 11:02   CT Soft Tissue Neck W Contrast  Result Date: 11/13/2020 CLINICAL DATA:  Esophageal pain EXAM: CT NECK WITH CONTRAST TECHNIQUE: Multidetector CT imaging of the neck was performed using the standard protocol following the bolus administration of intravenous contrast. CONTRAST:  69mL OMNIPAQUE IOHEXOL 300 MG/ML  SOLN COMPARISON:  None. FINDINGS: Pharynx and larynx: There is medialization of the left vocal fold with dilatation of the left piriform sinus. The  larynx and pharynx are otherwise normal. Normal epiglottis. No retropharyngeal abnormality. Esophagus is patulous. There is layering contrast material visible within the upper thoracic esophagus. Salivary glands: No inflammation, mass, or stone. Thyroid: Normal. Lymph nodes: None enlarged or abnormal density. Vascular: Calcific aortic atherosclerosis. Limited intracranial: Normal Visualized orbits: Normal Mastoids and  visualized paranasal sinuses: Clear. Skeleton: No acute or aggressive process. Upper chest: Please see dedicated report for CT chest. Other: None. IMPRESSION: 1. Medialization of the left vocal fold with dilatation of the left piriform sinus, suggesting left vocal cord paralysis. 2. Patulous esophagus. Aortic Atherosclerosis (ICD10-I70.0). Electronically Signed   By: Ulyses Jarred M.D.   On: 11/13/2020 01:51   CT Chest W Contrast  Result Date: 11/13/2020 CLINICAL DATA:  Chest pain, shortness of breath, incontinent urine, hematuria, abnormal abdominal CT EXAM: CT CHEST WITH CONTRAST TECHNIQUE: Multidetector CT imaging of the chest was performed during intravenous contrast administration. CONTRAST:  66mL OMNIPAQUE IOHEXOL 300 MG/ML  SOLN COMPARISON:  Same-day CT abdomen and pelvis, CT chest 10/05/2019 FINDINGS: Cardiovascular: Cardiac size top-normal to mildly enlarged. Mild compression of the right heart by a chronic chest wall deformity. Calcifications of the mitral annulus and minimally upon the aortic leaflets. Coronary artery calcifications are present as well. No pericardial effusion. No large central or lobar pulmonary artery filling defects within the limitations of this unenhanced CT. Surgical truncation of left hilar vessels, likely related to prior lobectomy. Atherosclerotic plaque within the normal caliber aorta. No acute luminal abnormality of the imaged aorta. No periaortic stranding or hemorrhage. Normal 3 vessel branching of the aortic arch. Proximal great vessels are mildly calcified but otherwise unremarkable. Mediastinum/Nodes: High attenuation enteric contrast media was administered with the patient on the table for assessment of the thoracic esophagus. There is circumferential thickening of the distal thoracic esophagus and to a lesser extent the GE junction however, there is no extravasation of the enteric contrast media nor extraluminal free air to suggest esophageal perforation. Some  minimal adjacent stranding is likely reactive. Fluid within the pericardial recesses. No free mediastinal fluid or gas. No organized collection or abscess. Normal thyroid gland and thoracic inlet. No acute abnormality of the trachea or esophagus. No worrisome mediastinal, hilar or axillary adenopathy. Lungs/Pleura: Postsurgical changes in the lungs likely reflecting a prior left lobectomy, correlate with surgical history. There is a small chronic left pleural effusion with some minimal pleural thickening and adjacent atelectasis, not significantly changed from comparison exam. There is increasingly conspicuous spiculated foci in the medial left lung apex measuring now 9 x 17 mm, previously 7 x 11 mm (3/25) additional scarring, retraction architectural distortion is seen involving a spiculated nodule previously seen in the right lung apex (3/27 with changes now extending to the pleural surface measuring 2.5 x 2.2 cm in transaxial dimension. Some additional bandlike scarring is seen in the posterior segment right upper lobe (3/60). New sub solid, subpleural nodule measuring 12.5 x 8 mm (3/116) in the periphery of the right lower lobe. No other concerning pulmonary nodules or masses. Background of centrilobular emphysematous change and chronic bronchitic features. Upper Abdomen: Vascular collaterals in the upper abdomen about the esophagus and GE junction. Mild thickening near the gastric cardia, contiguous with esophageal thickening above. For additional findings please see same day CT of the abdomen and pelvis. Musculoskeletal: Chest wall asymmetry, possibly postsurgical in nature. Paucity of subcutaneous fat, correlate with nutritional status. Multilevel degenerative changes are present in the imaged portions of the spine. Interval vertebroplasty changes involving the T6, T7  vertebrae, unchanged remote vertebral body height loss T10. Age indeterminate though possibly acute on chronic T11 vertebral plana deformity  given some adjacent paraspinal thickening. No other acute or conspicuous osseous lesions. IMPRESSION: 1. Circumferential thickening of the distal thoracic esophagus and gastric cardia. No extravasation of administered enteric contrast media nor extraluminal gas to suggest perforation. Minimal adjacent stranding may be merely reactive. 2. Postsurgical changes in the left lung likely reflecting a prior left lower lobectomy, correlate with surgical history. Chronic left basilar effusion and pleural thickening is grossly similar to prior. 3. Increasingly conspicuous spiculated foci in the medial left lung apex measuring now 9 x 17 mm, previously 7 x 11 mm. Additional scarring, retraction architectural distortion is seen involving a spiculated nodule previously seen in the right lung apex with changes now extending to the pleural surface measuring 2.5 x 2.2 cm in transaxial dimension. Findings are concerning for possible malignancy. 4. New sub solid, subpleural nodule measuring 12.5 x 8 mm in the periphery of the right lower lobe. This could reflect an infectious or inflammatory process versus a metastatic lesion. 5. Interval vertebroplasty changes involving the T6, T7 vertebrae. Stable remote compression deformity T10. 6. Possible acute on chronic T11 vertebral plana deformity given some adjacent paraspinal thickening. Correlate for point tenderness. 7. Chronic chest wall asymmetry. 8. Paucity of subcutaneous fat, correlate with nutritional status. 9. Aortic Atherosclerosis (ICD10-I70.0) 10. Emphysema (ICD10-J43.9) and chronic bronchitic changes. Electronically Signed   By: Lovena Le M.D.   On: 11/13/2020 01:57   DG CHEST PORT 1 VIEW  Result Date: 11/13/2020 CLINICAL DATA:  Chest pain. EXAM: PORTABLE CHEST 1 VIEW COMPARISON:  CT 11/13/2020. FINDINGS: Mediastinum and hilar structures normal. Stable cardiomegaly. No pulmonary venous congestion. Postsurgical changes left lung. Persistent densities noted both upper  lungs best seen by prior CT. Nodular density in the right base noted CT not visualized by plain chest x-ray. Reference is made to prior CT of 11/13/2020. Small left pleural effusion. No pneumothorax. IMPRESSION: 1. Stable cardiomegaly.  No pulmonary venous congestion. 2. Postsurgical changes left lung. Persistent densities in the upper lungs, best identified by CT. Nodular density in the right lung base best identified by CT. No acute alveolar infiltrates. Small left pleural effusion. Electronically Signed   By: Marcello Moores  Register   On: 11/13/2020 07:42   CT Renal Stone Study  Result Date: 11/13/2020 CLINICAL DATA:  Hematuria, unknown cause EXAM: CT ABDOMEN AND PELVIS WITHOUT CONTRAST TECHNIQUE: Multidetector CT imaging of the abdomen and pelvis was performed following the standard protocol without IV contrast. COMPARISON:  CT chest 10/05/2019 FINDINGS: Lower chest: Emphysematous changes noted in the lung bases. Bandlike opacity in the lung bases likely reflect areas of scarring. Right lung is otherwise relatively clear. There is a trace left pleural effusion and atelectatic change with some questionable pleural thickening, incompletely assessed on noncontrast exam. Thickened and fluid-filled distal esophagus is noted. Cardiac size within normal limits with slight compression of the right heart by the chest wall deformity. Hypoattenuation of the cardiac blood pool may reflect a mild anemia. Hepatobiliary: No visible focal liver lesion within limitations of this unenhanced CT gallbladder contains some dependently layering attenuation which may reflect biliary sludge. No pericholecystic fluid or inflammation. No biliary ductal dilatation or intraductal gallstones are seen. Pancreas: There is fatty infiltration of the pancreas, particularly towards the pancreatic head and uncinate. No clear peripancreatic inflammation or pancreatic ductal dilatation. Spleen: Normal in size. No concerning splenic lesions.  Adrenals/Urinary Tract: Normal adrenal glands. Slight inferior positioning  of the right kidney may related to normal anatomic variance or chest wall deformity. No visible concerning renal lesion. No urolithiasis or hydronephrosis. Mild symmetric bilateral perinephric stranding, a nonspecific finding which may correlate with advanced age or decreased renal function. Bladder is largely decompressed though wall thickening and hazy perivesicular stranding is more conspicuous than mere underdistention. Additionally, there is some questionable asymmetric thickening on the left posterolateral aspect of the bladder wall and some layering hyperdensity in the urinary bladder lumen concerning for hemorrhagic products/clot given hematuria. Stomach/Bowel: Irregular thickening of the fluid-filled distal thoracic esophagus with adjacent inflammatory stranding lower mediastinum. Extensive paraesophageal and gastric vascular collaterals are noted as well. Distal stomach and duodenum are unremarkable. No small bowel thickening or dilatation. Moderate to large colonic stool burden. Normal appendix. No evidence of bowel obstruction. Vascular/Lymphatic: Upper abdominal vascular collaterals. Atherosclerotic calcifications within the abdominal aorta and branch vessels. No aneurysm or ectasia. No enlarged abdominopelvic lymph nodes. Reproductive: Retroverted uterus.  No concerning adnexal lesions. Other: No abdominopelvic free fluid or free gas. No bowel containing hernias. Musculoskeletal: Chest wall deformity, levocurvature of the lumbar spine, remote appearing incomplete burst fracture involving the superior endplate L5 with slight retropulsion, in combination with severe facet degenerative changes and ligamentum flavum infolding there is resulting severe canal stenosis at the L4-5 level. Additional moderate to severe stenoses present L3-4 as well multilevel moderate to severe neural foraminal narrowing is seen throughout the lumbar  levels. The overall sclerotic appearance of the L5 and S1 vertebral bodies is favored to be on a Modic type bases given the absence of adjacent inflammation, fluid collections or other features to suggest infection. IMPRESSION: 1. Bladder is largely decompressed though wall thickening and hazy perivesicular stranding is more conspicuous than mere underdistention. Additionally, there is some questionable asymmetric thickening on the left posterolateral aspect of the bladder wall and some layering hyperdensity in the urinary bladder lumen concerning for hemorrhagic products/clot given hematuria. Recommend further evaluation with urinalysis and cystoscopy. 2. Mild symmetric bilateral perinephric stranding, a nonspecific finding which may correlate with advanced age or decreased renal function though given bladder findings, ascending tract infection is not excluded. 3. Chronic distal esophageal thickening with some adjacent hazy stranding in the lower mediastinum. Correlate for esophageal symptoms and results of prior endoscopy if performed, otherwise consider direct visualization. 4. Left pleural effusion and atelectatic change with some questionable pleural thickening, incompletely assessed on noncontrast exam. While this could feasibly be related to the esophageal process, the appearance is fairly similar to comparison study from 2020 and could reflect some chronic change. 5. Extensive paraesophageal and gastric vascular collaterals. 6. Remote appearing incomplete burst fracture involving the superior endplate L5 with slight retropulsion, in combination with severe facet degenerative changes and ligamentum flavum infolding there is resulting severe canal stenosis at the L4-5 level. Additional moderate to severe stenoses present L3-4 as well as multilevel moderate to severe neural foraminal narrowing throughout the lumbar levels. 7. Hypoattenuation of the cardiac blood pool may reflect a mild anemia. 8. Aortic  Atherosclerosis (ICD10-I70.0) 9. Emphysema (ICD10-J43.9). These results were called by telephone at the time of interpretation on 11/13/2020 at 12:11 am to provider Tegeler, who verbally acknowledged these results. Electronically Signed   By: Lovena Le M.D.   On: 11/13/2020 00:10        Subjective: Patient is feeling better, no nausea or vomiting, no chest pain or dyspnea, no confusion or agitation, no urinary retention.   Discharge Exam: Vitals:   11/14/20 2109 11/15/20 4008  BP: (!) 141/65 (!) 155/84  Pulse: 77 65  Resp: 20 18  Temp: 98.3 F (36.8 C) 98.5 F (36.9 C)  SpO2: 96% 100%   Vitals:   11/14/20 0509 11/14/20 1152 11/14/20 2109 11/15/20 0611  BP: (!) 147/84 (!) 146/86 (!) 141/65 (!) 155/84  Pulse: 78 79 77 65  Resp: 18 18 20 18   Temp: 98 F (36.7 C) 98.7 F (37.1 C) 98.3 F (36.8 C) 98.5 F (36.9 C)  TempSrc: Oral  Oral Oral  SpO2: 94% 95% 96% 100%  Weight:      Height:        General: Not in pain or dyspnea.  Neurology: Awake and alert, non focal  E ENT: no pallor, no icterus, oral mucosa moist Cardiovascular: No JVD. S1-S2 present, rhythmic, no gallops, rubs, or murmurs. No lower extremity edema. Pulmonary: positive breath sounds bilaterally,  Gastrointestinal. Abdomen soft and non tender Skin. No rashes Musculoskeletal: no joint deformities   The results of significant diagnostics from this hospitalization (including imaging, microbiology, ancillary and laboratory) are listed below for reference.     Microbiology: Recent Results (from the past 240 hour(s))  Blood culture (routine x 2)     Status: None (Preliminary result)   Collection Time: 11/13/20  1:00 AM   Specimen: BLOOD LEFT FOREARM  Result Value Ref Range Status   Specimen Description   Final    BLOOD LEFT FOREARM Performed at [facility], [place] [address], [place], [place] [address]    Special Requests   Final    BOTTLES DRAWN AEROBIC AND ANAEROBIC BLOOD LEFT FOREARM Performed  at [facility], [address] [place], [place] [address]    Culture   Final    NO GROWTH 2 DAYS Performed at [facility], [place] [address], [place], [place] [address]    Report Status PENDING  Incomplete  Blood culture (routine x 2)     Status: None (Preliminary result)   Collection Time: 11/13/20  1:52 AM   Specimen: BLOOD LEFT HAND  Result Value Ref Range Status   Specimen Description   Final    BLOOD LEFT HAND Performed at [facility], [place] [address], [place], [place] [address]    Special Requests   Final    BOTTLES DRAWN AEROBIC AND ANAEROBIC LEFT HAND Performed at [facility], [address] [place], [place] [address]    Culture   Final    NO GROWTH 2 DAYS Performed at [facility], [place] [address], [place], [place] [address]    Report Status PENDING  Incomplete  Resp Panel by RT-PCR (Flu A&B, Covid) Nasopharyngeal Swab     Status: None   Collection Time: 11/13/20  2:26 AM   Specimen: Nasopharyngeal Swab; Nasopharyngeal(NP) swabs in vial transport medium  Result Value Ref Range Status   SARS Coronavirus 2 by RT PCR NEGATIVE NEGATIVE Final    Comment: (NOTE) SARS-CoV-2 target nucleic acids are NOT DETECTED.  The SARS-CoV-2 RNA is generally detectable in upper respiratory specimens during the acute phase of infection. The lowest concentration of SARS-CoV-2 viral copies this assay can detect is 138 copies/mL. A negative result does not preclude SARS-Cov-2 infection and should not be used as the sole basis for treatment or other patient management decisions. A negative result may occur with  improper specimen collection/handling, submission of specimen other than nasopharyngeal swab, presence of viral mutation(s) within the areas targeted by this assay,  and inadequate number of viral copies(<138 copies/mL). A negative result must be combined with clinical observations, patient history, and epidemiological information. The  expected result is Negative.  Fact Sheet for Patients:  EntrepreneurPulse.com.au  Fact Sheet for Healthcare Providers:  IncredibleEmployment.be  This test is no t yet approved or cleared by the [place] FDA and  has been authorized for detection and/or diagnosis of SARS-CoV-2 by FDA under an Emergency Use Authorization (EUA). This EUA will remain  in effect (meaning this test can be used) for the duration of the COVID-19 declaration under Section 564(b)(1) of the Act, 21 U.S.C.section 360bbb-3(b)(1), unless the authorization is terminated  or revoked sooner.       Influenza A by PCR NEGATIVE NEGATIVE Final   Influenza B by PCR NEGATIVE NEGATIVE Final    Comment: (NOTE) The Xpert Xpress SARS-CoV-2/FLU/RSV plus assay is intended as an aid in the diagnosis of influenza from Nasopharyngeal swab specimens and should not be used as a sole basis for treatment. Nasal washings and aspirates are unacceptable for Xpert Xpress SARS-CoV-2/FLU/RSV testing.  Fact Sheet for Patients: EntrepreneurPulse.com.au  Fact Sheet for Healthcare Providers: IncredibleEmployment.be  This test is not yet approved or cleared by the [place] FDA and has been authorized for detection and/or diagnosis of SARS-CoV-2 by FDA under an Emergency Use Authorization (EUA). This EUA will remain in effect (meaning this test can be used) for the duration of the COVID-19 declaration under Section 564(b)(1) of the Act, 21 U.S.C. section 360bbb-3(b)(1), unless the authorization is terminated or revoked.  Performed at [facility], [address] [place], [place] [address]      Labs: BNP (last 3 results) No results for input(s): BNP in the last 8760 hours. Basic Metabolic Panel: Recent Labs  Lab 11/12/20 2022 11/13/20 0613 11/14/20 0458 11/15/20 0505  NA 134* 136 139 138  K 4.8 3.9 3.8 3.5  CL 93* 99 101 102  CO2 25  24 26 26   GLUCOSE 163* 77 107* 82  BUN 26* 19 12 13   CREATININE 1.55* 1.19* 0.83 0.85  CALCIUM 10.1 9.3 9.1 9.1   Liver Function Tests: Recent Labs  Lab 11/12/20 2022  AST 43*  ALT 31  ALKPHOS 137*  BILITOT 0.5  PROT 8.8*  ALBUMIN 4.9   No results for input(s): LIPASE, AMYLASE in the last 168 hours. No results for input(s): AMMONIA in the last 168 hours. CBC: Recent Labs  Lab 11/12/20 2022 11/13/20 0613 11/14/20 0458 11/15/20 0505  WBC 17.2* 9.9 7.6 8.4  NEUTROABS 15.8*  --   --  5.3  HGB 15.2* 12.1 12.2 11.6*  HCT 46.6* 36.2 35.5* 33.7*  MCV 99.4 98.6 95.9 96.3  PLT 380 237 237 221   Cardiac Enzymes: No results for input(s): CKTOTAL, CKMB, CKMBINDEX, TROPONINI in the last 168 hours. BNP: Invalid input(s): POCBNP CBG: No results for input(s): GLUCAP in the last 168 hours. D-Dimer No results for input(s): DDIMER in the last 72 hours. Hgb A1c No results for input(s): HGBA1C in the last 72 hours. Lipid Profile No results for input(s): CHOL, HDL, LDLCALC, TRIG, CHOLHDL, LDLDIRECT in the last 72 hours. Thyroid function studies No results for input(s): TSH, T4TOTAL, T3FREE, THYROIDAB in the last 72 hours.  Invalid input(s): FREET3 Anemia work up No results for input(s): VITAMINB12, FOLATE, FERRITIN, TIBC, IRON, RETICCTPCT in the last 72 hours. Urinalysis    Component Value Date/Time   COLORURINE RED (A) 11/12/2020 2022   APPEARANCEUR TURBID (A) 11/12/2020 2022  LABSPEC  11/12/2020 2022    TEST NOT REPORTED DUE TO COLOR INTERFERENCE OF URINE PIGMENT   PHURINE  11/12/2020 2022    TEST NOT REPORTED DUE TO COLOR INTERFERENCE OF URINE PIGMENT   GLUCOSEU (A) 11/12/2020 2022    TEST NOT REPORTED DUE TO COLOR INTERFERENCE OF URINE PIGMENT   HGBUR (A) 11/12/2020 2022    TEST NOT REPORTED DUE TO COLOR INTERFERENCE OF URINE PIGMENT   BILIRUBINUR (A) 11/12/2020 2022    TEST NOT REPORTED DUE TO COLOR INTERFERENCE OF URINE PIGMENT   KETONESUR (A) 11/12/2020 2022     TEST NOT REPORTED DUE TO COLOR INTERFERENCE OF URINE PIGMENT   PROTEINUR (A) 11/12/2020 2022    TEST NOT REPORTED DUE TO COLOR INTERFERENCE OF URINE PIGMENT   NITRITE (A) 11/12/2020 2022    TEST NOT REPORTED DUE TO COLOR INTERFERENCE OF URINE PIGMENT   LEUKOCYTESUR (A) 11/12/2020 2022    TEST NOT REPORTED DUE TO COLOR INTERFERENCE OF URINE PIGMENT   Sepsis Labs Invalid input(s): PROCALCITONIN,  WBC,  LACTICIDVEN Microbiology Recent Results (from the past 240 hour(s))  Blood culture (routine x 2)     Status: None (Preliminary result)   Collection Time: 11/13/20  1:00 AM   Specimen: BLOOD LEFT FOREARM  Result Value Ref Range Status   Specimen Description   Final    BLOOD LEFT FOREARM Performed at [facility], [place] [address] [place], [place] [address]    Special Requests   Final    BOTTLES DRAWN AEROBIC AND ANAEROBIC BLOOD LEFT FOREARM Performed at [facility], [address] [place], [place] [address]    Culture   Final    NO GROWTH 2 DAYS Performed at [facility], [place] [address], [place], [place] [address]    Report Status PENDING  Incomplete  Blood culture (routine x 2)     Status: None (Preliminary result)   Collection Time: 11/13/20  1:52 AM   Specimen: BLOOD LEFT HAND  Result Value Ref Range Status   Specimen Description   Final    BLOOD LEFT HAND Performed at [facility], [place] [address], [place], [place] [address]    Special Requests   Final    BOTTLES DRAWN AEROBIC AND ANAEROBIC LEFT HAND Performed at [facility], [address] [place], [place] [address]    Culture   Final    NO GROWTH 2 DAYS Performed at [facility], [place] [address], [place], [place] [address]    Report Status PENDING  Incomplete  Resp Panel by RT-PCR (Flu A&B, Covid) Nasopharyngeal Swab     Status: None   Collection Time: 11/13/20  2:26 AM   Specimen: Nasopharyngeal Swab; Nasopharyngeal(NP) swabs in vial transport medium  Result Value Ref  Range Status   SARS Coronavirus 2 by RT PCR NEGATIVE NEGATIVE Final    Comment: (NOTE) SARS-CoV-2 target nucleic acids are NOT DETECTED.  The SARS-CoV-2 RNA is generally detectable in upper respiratory specimens during the acute phase of infection. The lowest concentration of SARS-CoV-2 viral copies this assay can detect is 138 copies/mL. A negative result does not preclude SARS-Cov-2 infection and should not be used as the sole basis for treatment or other patient management decisions. A negative result may occur with  improper specimen collection/handling, submission of specimen other than nasopharyngeal swab, presence of viral mutation(s) within the areas targeted by this assay, and inadequate number of viral copies(<138 copies/mL). A  negative result must be combined with clinical observations, patient history, and epidemiological information. The expected result is Negative.  Fact Sheet for Patients:  EntrepreneurPulse.com.au  Fact Sheet for Healthcare Providers:  IncredibleEmployment.be  This test is no t yet approved or cleared by the [place] FDA and  has been authorized for detection and/or diagnosis of SARS-CoV-2 by FDA under an Emergency Use Authorization (EUA). This EUA will remain  in effect (meaning this test can be used) for the duration of the COVID-19 declaration under Section 564(b)(1) of the Act, 21 U.S.C.section 360bbb-3(b)(1), unless the authorization is terminated  or revoked sooner.       Influenza A by PCR NEGATIVE NEGATIVE Final   Influenza B by PCR NEGATIVE NEGATIVE Final    Comment: (NOTE) The Xpert Xpress SARS-CoV-2/FLU/RSV plus assay is intended as an aid in the diagnosis of influenza from Nasopharyngeal swab specimens and should not be used as a sole basis for treatment. Nasal washings and aspirates are unacceptable for Xpert Xpress SARS-CoV-2/FLU/RSV testing.  Fact Sheet for  Patients: EntrepreneurPulse.com.au  Fact Sheet for Healthcare Providers: IncredibleEmployment.be  This test is not yet approved or cleared by the [place] FDA and has been authorized for detection and/or diagnosis of SARS-CoV-2 by FDA under an Emergency Use Authorization (EUA). This EUA will remain in effect (meaning this test can be used) for the duration of the COVID-19 declaration under Section 564(b)(1) of the Act, 21 U.S.C. section 360bbb-3(b)(1), unless the authorization is terminated or revoked.  Performed at [facility], [address], [place], [place] [address]      Time coordinating discharge: 45 minutes  SIGNED:   Tawni Millers, MD  [facility] 11/15/2020, 9:27 AM

## 2020-11-15 NOTE — Progress Notes (Signed)
OT Cancellation Note  Patient Details Name: Mariah Bruce MRN: [id number] DOB: 1946/02/05   Cancelled Treatment:    Reason Eval/Treat Not Completed: OT screened, no needs identified, will sign off. Noted PT eval only with pt demonstrating ability to ambulate in room without assistance. Collaborated with RN - pt going to bathroom without assistance, no confusion noted. Anticipate pt at baseline for ADLs and will be discharging with 24/7 family support. No skilled OT interventions needed at this time.   Layla Maw 11/15/2020, 8:32 AM

## 2020-11-18 LAB — CULTURE, BLOOD (ROUTINE X 2)
Culture: NO GROWTH
Culture: NO GROWTH

## 2020-11-20 ENCOUNTER — Encounter (HOSPITAL_COMMUNITY): Payer: Self-pay

## 2020-11-20 ENCOUNTER — Emergency Department (HOSPITAL_COMMUNITY): Payer: Medicare Other

## 2020-11-20 ENCOUNTER — Other Ambulatory Visit: Payer: Self-pay

## 2020-11-20 ENCOUNTER — Inpatient Hospital Stay (HOSPITAL_COMMUNITY)
Admission: EM | Admit: 2020-11-20 | Discharge: 2020-11-27 | DRG: 521 | Disposition: A | Discharge status: Skilled Nursing Facility | Payer: Medicare Other | Attending: Internal Medicine | Admitting: Internal Medicine

## 2020-11-20 DIAGNOSIS — R9431 Abnormal electrocardiogram [ECG] [EKG]: Secondary | ICD-10-CM | POA: Diagnosis present

## 2020-11-20 DIAGNOSIS — Z79899 Other long term (current) drug therapy: Secondary | ICD-10-CM

## 2020-11-20 DIAGNOSIS — D696 Thrombocytopenia, unspecified: Secondary | ICD-10-CM | POA: Diagnosis not present

## 2020-11-20 DIAGNOSIS — R0902 Hypoxemia: Secondary | ICD-10-CM

## 2020-11-20 DIAGNOSIS — K219 Gastro-esophageal reflux disease without esophagitis: Secondary | ICD-10-CM | POA: Diagnosis present

## 2020-11-20 DIAGNOSIS — D72829 Elevated white blood cell count, unspecified: Secondary | ICD-10-CM | POA: Diagnosis not present

## 2020-11-20 DIAGNOSIS — E876 Hypokalemia: Secondary | ICD-10-CM | POA: Diagnosis present

## 2020-11-20 DIAGNOSIS — R319 Hematuria, unspecified: Secondary | ICD-10-CM | POA: Diagnosis present

## 2020-11-20 DIAGNOSIS — N182 Chronic kidney disease, stage 2 (mild): Secondary | ICD-10-CM | POA: Diagnosis present

## 2020-11-20 DIAGNOSIS — J449 Chronic obstructive pulmonary disease, unspecified: Secondary | ICD-10-CM | POA: Diagnosis present

## 2020-11-20 DIAGNOSIS — I160 Hypertensive urgency: Secondary | ICD-10-CM | POA: Diagnosis not present

## 2020-11-20 DIAGNOSIS — N390 Urinary tract infection, site not specified: Secondary | ICD-10-CM | POA: Diagnosis not present

## 2020-11-20 DIAGNOSIS — G8929 Other chronic pain: Secondary | ICD-10-CM | POA: Diagnosis present

## 2020-11-20 DIAGNOSIS — S72002A Fracture of unspecified part of neck of left femur, initial encounter for closed fracture: Secondary | ICD-10-CM | POA: Diagnosis present

## 2020-11-20 DIAGNOSIS — W1830XA Fall on same level, unspecified, initial encounter: Secondary | ICD-10-CM | POA: Diagnosis present

## 2020-11-20 DIAGNOSIS — I4510 Unspecified right bundle-branch block: Secondary | ICD-10-CM | POA: Diagnosis present

## 2020-11-20 DIAGNOSIS — Z419 Encounter for procedure for purposes other than remedying health state, unspecified: Secondary | ICD-10-CM

## 2020-11-20 DIAGNOSIS — G9341 Metabolic encephalopathy: Secondary | ICD-10-CM | POA: Diagnosis present

## 2020-11-20 DIAGNOSIS — F419 Anxiety disorder, unspecified: Secondary | ICD-10-CM | POA: Diagnosis present

## 2020-11-20 DIAGNOSIS — E559 Vitamin D deficiency, unspecified: Secondary | ICD-10-CM | POA: Diagnosis present

## 2020-11-20 DIAGNOSIS — Z681 Body mass index (BMI) 19 or less, adult: Secondary | ICD-10-CM

## 2020-11-20 DIAGNOSIS — C3490 Malignant neoplasm of unspecified part of unspecified bronchus or lung: Secondary | ICD-10-CM | POA: Diagnosis present

## 2020-11-20 DIAGNOSIS — E43 Unspecified severe protein-calorie malnutrition: Secondary | ICD-10-CM | POA: Diagnosis present

## 2020-11-20 DIAGNOSIS — I129 Hypertensive chronic kidney disease with stage 1 through stage 4 chronic kidney disease, or unspecified chronic kidney disease: Secondary | ICD-10-CM | POA: Diagnosis present

## 2020-11-20 DIAGNOSIS — Y92019 Unspecified place in single-family (private) house as the place of occurrence of the external cause: Secondary | ICD-10-CM

## 2020-11-20 DIAGNOSIS — Z66 Do not resuscitate: Secondary | ICD-10-CM | POA: Diagnosis present

## 2020-11-20 DIAGNOSIS — F32A Depression, unspecified: Secondary | ICD-10-CM | POA: Diagnosis present

## 2020-11-20 DIAGNOSIS — Z880 Allergy status to penicillin: Secondary | ICD-10-CM

## 2020-11-20 DIAGNOSIS — N179 Acute kidney failure, unspecified: Secondary | ICD-10-CM | POA: Diagnosis not present

## 2020-11-20 DIAGNOSIS — F17210 Nicotine dependence, cigarettes, uncomplicated: Secondary | ICD-10-CM | POA: Diagnosis present

## 2020-11-20 DIAGNOSIS — F1390 Sedative, hypnotic, or anxiolytic use, unspecified, uncomplicated: Secondary | ICD-10-CM | POA: Diagnosis present

## 2020-11-20 DIAGNOSIS — Z720 Tobacco use: Secondary | ICD-10-CM | POA: Diagnosis present

## 2020-11-20 DIAGNOSIS — R64 Cachexia: Secondary | ICD-10-CM | POA: Diagnosis present

## 2020-11-20 DIAGNOSIS — R410 Disorientation, unspecified: Secondary | ICD-10-CM

## 2020-11-20 DIAGNOSIS — J90 Pleural effusion, not elsewhere classified: Secondary | ICD-10-CM | POA: Diagnosis present

## 2020-11-20 DIAGNOSIS — S72035A Nondisplaced midcervical fracture of left femur, initial encounter for closed fracture: Principal | ICD-10-CM | POA: Diagnosis present

## 2020-11-20 DIAGNOSIS — Z20822 Contact with and (suspected) exposure to covid-19: Secondary | ICD-10-CM | POA: Diagnosis present

## 2020-11-20 LAB — CBC WITH DIFFERENTIAL/PLATELET
Abs Immature Granulocytes: 0.08 10*3/uL — ABNORMAL HIGH (ref 0.00–0.07)
Basophils Absolute: 0.1 10*3/uL (ref 0.0–0.1)
Basophils Relative: 1 %
Eosinophils Absolute: 0.2 10*3/uL (ref 0.0–0.5)
Eosinophils Relative: 1 %
HCT: 41.1 % (ref 36.0–46.0)
Hemoglobin: 13.5 g/dL (ref 12.0–15.0)
Immature Granulocytes: 1 %
Lymphocytes Relative: 6 %
Lymphs Abs: 0.9 10*3/uL (ref 0.7–4.0)
MCH: 32.5 pg (ref 26.0–34.0)
MCHC: 32.8 g/dL (ref 30.0–36.0)
MCV: 99 fL (ref 80.0–100.0)
Monocytes Absolute: 0.6 10*3/uL (ref 0.1–1.0)
Monocytes Relative: 4 %
Neutro Abs: 13.7 10*3/uL — ABNORMAL HIGH (ref 1.7–7.7)
Neutrophils Relative %: 87 %
Platelets: 151 10*3/uL (ref 150–400)
RBC: 4.15 MIL/uL (ref 3.87–5.11)
RDW: 14.1 % (ref 11.5–15.5)
WBC: 15.6 10*3/uL — ABNORMAL HIGH (ref 4.0–10.5)
nRBC: 0 % (ref 0.0–0.2)

## 2020-11-20 NOTE — ED Notes (Signed)
Fall, hip pain

## 2020-11-20 NOTE — ED Triage Notes (Addendum)
Pt arrives EMS from home with c/o a fall yesterday and hip pain. Pt recently admitted and started on Keflex. No obvious rotation or shortening observed.   163mcg fentanyl administered by ems

## 2020-11-20 NOTE — ED Provider Notes (Signed)
[place] DEPT Provider Note: Georgena Spurling, MD, FACEP  CSN: [id number] MRN: [id number] ARRIVAL: 11/20/20 at [place]: [place]  Hip Injury  Level 5 caveat: Confusion, inappropriate answers to questions HISTORY OF PRESENT ILLNESS  11/20/20 11:29 PM Mariah Bruce is a 75 y.o. female who was recently admitted to the hospital and discharged on 11/15/2020 on Keflex for hemorrhagic cystitis.  EMS reports she fell yesterday and injured her hip.  The patient tells me she does not know what morning or evening it is, she told me it was not Wednesday night when I told her it was Wednesday night, and she does not know when she might have fallen.  She is able to tell me she is having pain in her "left pelvic area".  She rates her pain as a 5 out of 10, worse with movement notably attempting to sit up.  There is no reported shortening or rotation of the hip.  She was given fentanyl 100 mcg IV by EMS prior to arrival.  He was placed on oxygen by nasal cannula for oxygen saturation of 81% on room air; it is unclear if this was due to the fentanyl.   Past Medical History:  Diagnosis Date   Anxiety    Chronic pain    COPD (chronic obstructive pulmonary disease) (HCC)    Depression    GERD (gastroesophageal reflux disease)    Lung cancer (HCC)     Past Surgical History:  Procedure Laterality Date   BACK SURGERY     HERNIA REPAIR     TONSILLECTOMY      Family History  Problem Relation Age of Onset   Hypertension Other     Social History   Tobacco Use   Smoking status: Current Every Day Smoker    Packs/day: 1.00    Years: 55.00    Pack years: 55.00    Types: Cigarettes   Smokeless tobacco: Never Used  Substance Use Topics   Alcohol use: Yes    Comment: 2 beers a night   Drug use: Never    Prior to Admission medications   Medication Sig Start Date End Date Taking? Authorizing Provider  amitriptyline (ELAVIL) 10 MG tablet Take 1 tablet (10  mg total) by mouth at bedtime. 11/15/20 12/15/20 Yes Arrien, Jimmy Picket, MD  citalopram (CELEXA) 20 MG tablet Take 1.5 tablets (30 mg total) by mouth daily. Taking 1 & 1/2 tab daily = 30 mg 11/15/20 12/15/20 Yes Arrien, Jimmy Picket, MD  famotidine (PEPCID) 40 MG tablet Take 1 tablet (40 mg total) by mouth daily for 30 doses. 11/15/20 12/15/20 Yes Arrien, Jimmy Picket, MD  gabapentin (NEURONTIN) 300 MG capsule Take 1-2 capsules (300-600 mg total) by mouth See admin instructions. Taking 300 mg in the Am and 600 mg at bedtime 11/15/20  Yes Arrien, Jimmy Picket, MD  ibuprofen (ADVIL) 200 MG tablet Take 400 mg by mouth every 6 (six) hours as needed for mild pain.   Yes [provider]  LORazepam (ATIVAN) 0.5 MG tablet Take 0.5-1 mg by mouth every 4 (four) hours as needed for anxiety (Shortness of breath).   Yes [provider]  oxyCODONE-acetaminophen (PERCOCET/ROXICET) 5-325 MG tablet Take 2 tablets by mouth every 4 (four) hours as needed for moderate pain.   Yes [provider]  senna-docusate (SENOKOT-S) 8.6-50 MG tablet Take 2 tablets by mouth 2 (two) times daily.   Yes [provider]  venlafaxine XR (EFFEXOR-XR) 37.5 MG 24 hr capsule  Take 1 capsule (37.5 mg total) by mouth daily. Taking with 75 mg daily = 112.5 mg 11/15/20 12/15/20 Yes Arrien, Jimmy Picket, MD  venlafaxine XR (EFFEXOR-XR) 75 MG 24 hr capsule Take 1 capsule (75 mg total) by mouth daily with breakfast. Taking with 37.5 mg daily = 112.5 mg 11/15/20 12/15/20 Yes Arrien, Jimmy Picket, MD    Allergies Penicillin g   REVIEW OF SYSTEMS  Cannot assess due to altered mental status.  Patient is unable to participate in review of systems.   PHYSICAL EXAMINATION  Initial Vital Signs Blood pressure (!) 158/88, pulse 98, temperature 98 F (36.7 C), resp. rate 17, SpO2 100 %.  Examination General: Well-developed, well-nourished female in no acute distress; appearance consistent with age of  record HENT: normocephalic; atraumatic; dry mucous membranes Eyes: pupils equal, round and reactive to light; extraocular muscles intact Neck: supple Heart: regular rate and rhythm Lungs: clear to auscultation bilaterally Abdomen: soft; nondistended; nontender; bowel sounds present Extremities: No deformity; pain on passive movement of left hip Neurologic: Awake, alert, confused; motor function intact in all extremities and symmetric; no facial droop Skin: Warm and dry Psychiatric: Flat affect   RESULTS  Summary of this visit's results, reviewed and interpreted by myself:   EKG Interpretation  Date/Time:  Thursday November 21 2020 00:31:53 EST Ventricular Rate:  100 PR Interval:    QRS Duration: 129 QT Interval:  374 QTC Calculation: 483 R Axis:   106 Text Interpretation: Sinus tachycardia Biatrial enlargement RBBB and LPFB ST elevation, consider inferior injury Confirmed by Shanon Rosser [phone]) on 11/21/2020 12:45:33 AM      Laboratory Studies: Results for orders placed or performed during the hospital encounter of 11/20/20 (from the past 24 hour(s))  CBC with Differential/Platelet     Status: Abnormal   Collection Time: 11/20/20 11:34 PM  Result Value Ref Range   WBC 15.6 (H) 4.0 - 10.5 K/uL   RBC 4.15 3.87 - 5.11 MIL/uL   Hemoglobin 13.5 12.0 - 15.0 g/dL   HCT 41.1 36.0 - 46.0 %   MCV 99.0 80.0 - 100.0 fL   MCH 32.5 26.0 - 34.0 pg   MCHC 32.8 30.0 - 36.0 g/dL   RDW 14.1 11.5 - 15.5 %   Platelets 151 150 - 400 K/uL   nRBC 0.0 0.0 - 0.2 %   Neutrophils Relative % 87 %   Neutro Abs 13.7 (H) 1.7 - 7.7 K/uL   Lymphocytes Relative 6 %   Lymphs Abs 0.9 0.7 - 4.0 K/uL   Monocytes Relative 4 %   Monocytes Absolute 0.6 0.1 - 1.0 K/uL   Eosinophils Relative 1 %   Eosinophils Absolute 0.2 0.0 - 0.5 K/uL   Basophils Relative 1 %   Basophils Absolute 0.1 0.0 - 0.1 K/uL   Immature Granulocytes 1 %   Abs Immature Granulocytes 0.08 (H) 0.00 - 0.07 K/uL  Basic metabolic panel      Status: Abnormal   Collection Time: 11/20/20 11:34 PM  Result Value Ref Range   Sodium 136 135 - 145 mmol/L   Potassium 3.4 (L) 3.5 - 5.1 mmol/L   Chloride 100 98 - 111 mmol/L   CO2 26 22 - 32 mmol/L   Glucose, Bld 138 (H) 70 - 99 mg/dL   BUN 26 (H) 8 - 23 mg/dL   Creatinine, Ser 0.93 0.44 - 1.00 mg/dL   Calcium 8.8 (L) 8.9 - 10.3 mg/dL   GFR, Estimated >60 >60 mL/min   Anion gap 10 5 -  15  Urinalysis, Routine w reflex microscopic     Status: Abnormal   Collection Time: 11/20/20 11:50 PM  Result Value Ref Range   Color, Urine YELLOW YELLOW   APPearance CLEAR CLEAR   Specific Gravity, Urine 1.023 1.005 - 1.030   pH 5.0 5.0 - 8.0   Glucose, UA 50 (A) NEGATIVE mg/dL   Hgb urine dipstick MODERATE (A) NEGATIVE   Bilirubin Urine NEGATIVE NEGATIVE   Ketones, ur 5 (A) NEGATIVE mg/dL   Protein, ur 100 (A) NEGATIVE mg/dL   Nitrite NEGATIVE NEGATIVE   Leukocytes,Ua NEGATIVE NEGATIVE   RBC / HPF 21-50 0 - 5 RBC/hpf   WBC, UA 11-20 0 - 5 WBC/hpf   Bacteria, UA RARE (A) NONE SEEN   Squamous Epithelial / LPF 0-5 0 - 5   Mucus PRESENT   Magnesium     Status: Abnormal   Collection Time: 11/21/20  1:45 AM  Result Value Ref Range   Magnesium 1.3 (L) 1.7 - 2.4 mg/dL  CK     Status: None   Collection Time: 11/21/20  1:45 AM  Result Value Ref Range   Total CK 210 38 - 234 U/L  Phosphorus     Status: Abnormal   Collection Time: 11/21/20  1:45 AM  Result Value Ref Range   Phosphorus 2.2 (L) 2.5 - 4.6 mg/dL  Ethanol     Status: None   Collection Time: 11/21/20  1:45 AM  Result Value Ref Range   Alcohol, Ethyl (B) <10 <10 mg/dL  Hepatic function panel     Status: Abnormal   Collection Time: 11/21/20  1:45 AM  Result Value Ref Range   Total Protein 6.1 (L) 6.5 - 8.1 g/dL   Albumin 3.5 3.5 - 5.0 g/dL   AST 27 15 - 41 U/L   ALT 29 0 - 44 U/L   Alkaline Phosphatase 103 38 - 126 U/L   Total Bilirubin 1.0 0.3 - 1.2 mg/dL   Bilirubin, Direct 0.2 0.0 - 0.2 mg/dL   Indirect Bilirubin 0.8 0.3  - 0.9 mg/dL  Protime-INR     Status: None   Collection Time: 11/21/20  1:45 AM  Result Value Ref Range   Prothrombin Time 13.7 11.4 - 15.2 seconds   INR 1.1 0.8 - 1.2  Troponin I (High Sensitivity)     Status: Abnormal   Collection Time: 11/21/20  1:45 AM  Result Value Ref Range   Troponin I (High Sensitivity) 26 (H) <18 ng/L  Urine rapid drug screen (hosp performed)     Status: Abnormal   Collection Time: 11/21/20  1:45 AM  Result Value Ref Range   Opiates NONE DETECTED NONE DETECTED   Cocaine NONE DETECTED NONE DETECTED   Benzodiazepines POSITIVE (A) NONE DETECTED   Amphetamines NONE DETECTED NONE DETECTED   Tetrahydrocannabinol NONE DETECTED NONE DETECTED   Barbiturates NONE DETECTED NONE DETECTED  Ammonia     Status: None   Collection Time: 11/21/20  1:45 AM  Result Value Ref Range   Ammonia 15 9 - 35 umol/L   Imaging Studies: CT PELVIS WO CONTRAST  Result Date: 11/21/2020 CLINICAL DATA:  Hip pain after fall EXAM: CT PELVIS WITHOUT CONTRAST TECHNIQUE: Multidetector CT imaging of the pelvis was performed following the standard protocol without intravenous contrast. COMPARISON:  None. FINDINGS: Urinary Tract: The visualized distal ureters and bladder appear unremarkable. Bowel: No bowel wall thickening, distention or surrounding inflammation identified within the pelvis. Vascular/Lymphatic: No enlarged pelvic lymph nodes identified. Scattered aortic atherosclerosis  is noted. Reproductive: The uterus and adnexa are unremarkable. Other: There is a soft tissue calcifications seen overlying the left inferior ischial tuberosity. Musculoskeletal: There is a comminuted slightly impacted nondisplaced fracture seen through the left transcervical femoral neck. The femoral head is still well seated within the acetabulum. A small hip joint effusion is seen. There is diffuse osteopenia. Again noted are degenerative changes in the lower lumbar spine. The sacroiliac joints appear to be intact.  IMPRESSION: Comminuted impacted nondisplaced transcervical left femoral neck fracture. Electronically Signed   By: Prudencio Pair M.D.   On: 11/21/2020 00:13   DG CHEST PORT 1 VIEW  Result Date: 11/21/2020 CLINICAL DATA:  Hypoxia EXAM: PORTABLE CHEST 1 VIEW COMPARISON:  November 13, 2020 FINDINGS: The heart size and mediastinal contours are mildly enlarged. Aortic knob calcifications are seen. Again noted is increased interstitial markings with fibrosis and scarring at the right lung apex. A unchanged small left pleural effusion is seen. No acute osseous abnormality. IMPRESSION: No significant change with the right apical scarring and fibrosis Small left pleural effusion Electronically Signed   By: Prudencio Pair M.D.   On: 11/21/2020 00:16    ED COURSE and MDM  Nursing notes, initial and subsequent vitals signs, including pulse oximetry, reviewed and interpreted by myself.  Vitals:   11/20/20 2343 11/20/20 2345 11/21/20 0015 11/21/20 0030  BP:  (!) 165/78 (!) 158/83 (!) 156/86  Pulse:  97  100  Resp:  19 19 20   Temp:      SpO2: 97% 99%  98%   Medications  potassium chloride 10 mEq in 100 mL IVPB (10 mEq Intravenous New Bag/Given 11/21/20 0202)  HYDROcodone-acetaminophen (NORCO/VICODIN) 5-325 MG per tablet 1-2 tablet (has no administration in time range)  morphine 2 MG/ML injection 0.5 mg (has no administration in time range)  methocarbamol (ROBAXIN) tablet 500 mg (has no administration in time range)    Or  methocarbamol (ROBAXIN) 500 mg in dextrose 5 % 50 mL IVPB (has no administration in time range)  thiamine (B-1) injection 100 mg (has no administration in time range)  LORazepam (ATIVAN) tablet 0.5 mg (has no administration in time range)    Or  LORazepam (ATIVAN) injection 0.5 mg (has no administration in time range)  venlafaxine XR (EFFEXOR-XR) 24 hr capsule 112.5 mg (has no administration in time range)  nicotine (NICODERM CQ - dosed in mg/24 hours) patch 21 mg (has no administration  in time range)  ipratropium (ATROVENT HFA) inhaler 2 puff (has no administration in time range)  albuterol (VENTOLIN HFA) 108 (90 Base) MCG/ACT inhaler 2 puff (has no administration in time range)  sodium chloride 0.9 % bolus 500 mL (500 mLs Intravenous New Bag/Given 11/21/20 0201)  0.9 %  sodium chloride infusion ( Intravenous New Bag/Given 11/21/20 0159)   Will have patient admitted to hospitalist service and consult orthopedic for repair of her fractured left hip.   1:10 AM Dr. Roel Cluck to admit.  We will consult orthopedics for the fracture.  PROCEDURES  Procedures   ED DIAGNOSES     ICD-10-CM   1. Closed fracture of left hip, initial encounter ([place])  S72.002A   2. Hypoxia  R09.02 DG CHEST PORT 1 VIEW    DG CHEST PORT 1 VIEW  3. Lower urinary tract infectious disease  N39.0   4. Delirium  R41.0        Trevionne Advani, Jenny Reichmann, MD 11/21/20 [phone]

## 2020-11-21 ENCOUNTER — Inpatient Hospital Stay (HOSPITAL_COMMUNITY): Payer: Medicare Other

## 2020-11-21 ENCOUNTER — Emergency Department (HOSPITAL_COMMUNITY): Payer: Medicare Other

## 2020-11-21 ENCOUNTER — Encounter (HOSPITAL_COMMUNITY): Payer: Self-pay

## 2020-11-21 DIAGNOSIS — E43 Unspecified severe protein-calorie malnutrition: Secondary | ICD-10-CM | POA: Diagnosis present

## 2020-11-21 DIAGNOSIS — W1830XA Fall on same level, unspecified, initial encounter: Secondary | ICD-10-CM | POA: Diagnosis present

## 2020-11-21 DIAGNOSIS — I361 Nonrheumatic tricuspid (valve) insufficiency: Secondary | ICD-10-CM

## 2020-11-21 DIAGNOSIS — I129 Hypertensive chronic kidney disease with stage 1 through stage 4 chronic kidney disease, or unspecified chronic kidney disease: Secondary | ICD-10-CM | POA: Diagnosis present

## 2020-11-21 DIAGNOSIS — R9431 Abnormal electrocardiogram [ECG] [EKG]: Secondary | ICD-10-CM | POA: Diagnosis present

## 2020-11-21 DIAGNOSIS — K219 Gastro-esophageal reflux disease without esophagitis: Secondary | ICD-10-CM | POA: Diagnosis present

## 2020-11-21 DIAGNOSIS — R0902 Hypoxemia: Secondary | ICD-10-CM

## 2020-11-21 DIAGNOSIS — F419 Anxiety disorder, unspecified: Secondary | ICD-10-CM | POA: Diagnosis present

## 2020-11-21 DIAGNOSIS — J449 Chronic obstructive pulmonary disease, unspecified: Secondary | ICD-10-CM | POA: Diagnosis present

## 2020-11-21 DIAGNOSIS — S72035A Nondisplaced midcervical fracture of left femur, initial encounter for closed fracture: Secondary | ICD-10-CM | POA: Diagnosis present

## 2020-11-21 DIAGNOSIS — N390 Urinary tract infection, site not specified: Secondary | ICD-10-CM | POA: Diagnosis present

## 2020-11-21 DIAGNOSIS — F32A Depression, unspecified: Secondary | ICD-10-CM | POA: Diagnosis present

## 2020-11-21 DIAGNOSIS — R64 Cachexia: Secondary | ICD-10-CM | POA: Diagnosis present

## 2020-11-21 DIAGNOSIS — F17210 Nicotine dependence, cigarettes, uncomplicated: Secondary | ICD-10-CM | POA: Diagnosis present

## 2020-11-21 DIAGNOSIS — Z66 Do not resuscitate: Secondary | ICD-10-CM | POA: Diagnosis present

## 2020-11-21 DIAGNOSIS — G9341 Metabolic encephalopathy: Secondary | ICD-10-CM | POA: Diagnosis present

## 2020-11-21 DIAGNOSIS — Z515 Encounter for palliative care: Secondary | ICD-10-CM | POA: Diagnosis not present

## 2020-11-21 DIAGNOSIS — C3490 Malignant neoplasm of unspecified part of unspecified bronchus or lung: Secondary | ICD-10-CM | POA: Diagnosis present

## 2020-11-21 DIAGNOSIS — I4510 Unspecified right bundle-branch block: Secondary | ICD-10-CM | POA: Diagnosis present

## 2020-11-21 DIAGNOSIS — R531 Weakness: Secondary | ICD-10-CM | POA: Diagnosis not present

## 2020-11-21 DIAGNOSIS — C3492 Malignant neoplasm of unspecified part of left bronchus or lung: Secondary | ICD-10-CM | POA: Diagnosis not present

## 2020-11-21 DIAGNOSIS — G8929 Other chronic pain: Secondary | ICD-10-CM | POA: Diagnosis present

## 2020-11-21 DIAGNOSIS — N179 Acute kidney failure, unspecified: Secondary | ICD-10-CM | POA: Diagnosis not present

## 2020-11-21 DIAGNOSIS — Z681 Body mass index (BMI) 19 or less, adult: Secondary | ICD-10-CM | POA: Diagnosis not present

## 2020-11-21 DIAGNOSIS — S72002A Fracture of unspecified part of neck of left femur, initial encounter for closed fracture: Secondary | ICD-10-CM | POA: Diagnosis not present

## 2020-11-21 DIAGNOSIS — F1390 Sedative, hypnotic, or anxiolytic use, unspecified, uncomplicated: Secondary | ICD-10-CM | POA: Diagnosis present

## 2020-11-21 DIAGNOSIS — N182 Chronic kidney disease, stage 2 (mild): Secondary | ICD-10-CM | POA: Diagnosis present

## 2020-11-21 DIAGNOSIS — R319 Hematuria, unspecified: Secondary | ICD-10-CM | POA: Diagnosis present

## 2020-11-21 DIAGNOSIS — I160 Hypertensive urgency: Secondary | ICD-10-CM | POA: Diagnosis not present

## 2020-11-21 DIAGNOSIS — Z7189 Other specified counseling: Secondary | ICD-10-CM | POA: Diagnosis not present

## 2020-11-21 DIAGNOSIS — J90 Pleural effusion, not elsewhere classified: Secondary | ICD-10-CM | POA: Diagnosis present

## 2020-11-21 DIAGNOSIS — E876 Hypokalemia: Secondary | ICD-10-CM | POA: Diagnosis present

## 2020-11-21 DIAGNOSIS — Y92019 Unspecified place in single-family (private) house as the place of occurrence of the external cause: Secondary | ICD-10-CM | POA: Diagnosis not present

## 2020-11-21 DIAGNOSIS — Z20822 Contact with and (suspected) exposure to covid-19: Secondary | ICD-10-CM | POA: Diagnosis present

## 2020-11-21 DIAGNOSIS — Z720 Tobacco use: Secondary | ICD-10-CM

## 2020-11-21 LAB — TYPE AND SCREEN
ABO/RH(D): O POS
Antibody Screen: NEGATIVE

## 2020-11-21 LAB — URINALYSIS, ROUTINE W REFLEX MICROSCOPIC
Bilirubin Urine: NEGATIVE
Glucose, UA: 50 mg/dL — AB
Ketones, ur: 5 mg/dL — AB
Leukocytes,Ua: NEGATIVE
Nitrite: NEGATIVE
Protein, ur: 100 mg/dL — AB
Specific Gravity, Urine: 1.023 (ref 1.005–1.030)
pH: 5 (ref 5.0–8.0)

## 2020-11-21 LAB — COMPREHENSIVE METABOLIC PANEL
ALT: 25 U/L (ref 0–44)
AST: 23 U/L (ref 15–41)
Albumin: 3 g/dL — ABNORMAL LOW (ref 3.5–5.0)
Alkaline Phosphatase: 88 U/L (ref 38–126)
Anion gap: 8 (ref 5–15)
BUN: 24 mg/dL — ABNORMAL HIGH (ref 8–23)
CO2: 26 mmol/L (ref 22–32)
Calcium: 8.3 mg/dL — ABNORMAL LOW (ref 8.9–10.3)
Chloride: 102 mmol/L (ref 98–111)
Creatinine, Ser: 0.7 mg/dL (ref 0.44–1.00)
GFR, Estimated: >60 mL/min (ref >60)
Glucose, Bld: 107 mg/dL — ABNORMAL HIGH (ref 70–99)
Potassium: 3.5 mmol/L (ref 3.5–5.1)
Sodium: 136 mmol/L (ref 135–145)
Total Bilirubin: 0.9 mg/dL (ref 0.3–1.2)
Total Protein: 5.3 g/dL — ABNORMAL LOW (ref 6.5–8.1)

## 2020-11-21 LAB — HEPATIC FUNCTION PANEL
ALT: 29 U/L (ref 0–44)
AST: 27 U/L (ref 15–41)
Albumin: 3.5 g/dL (ref 3.5–5.0)
Alkaline Phosphatase: 103 U/L (ref 38–126)
Bilirubin, Direct: 0.2 mg/dL (ref 0.0–0.2)
Indirect Bilirubin: 0.8 mg/dL (ref 0.3–0.9)
Total Bilirubin: 1 mg/dL (ref 0.3–1.2)
Total Protein: 6.1 g/dL — ABNORMAL LOW (ref 6.5–8.1)

## 2020-11-21 LAB — PHOSPHORUS: Phosphorus: 2.2 mg/dL — ABNORMAL LOW (ref 2.5–4.6)

## 2020-11-21 LAB — CBC
HCT: 35.9 % — ABNORMAL LOW (ref 36.0–46.0)
Hemoglobin: 11.8 g/dL — ABNORMAL LOW (ref 12.0–15.0)
MCH: 32.3 pg (ref 26.0–34.0)
MCHC: 32.9 g/dL (ref 30.0–36.0)
MCV: 98.4 fL (ref 80.0–100.0)
Platelets: 134 10*3/uL — ABNORMAL LOW (ref 150–400)
RBC: 3.65 MIL/uL — ABNORMAL LOW (ref 3.87–5.11)
RDW: 14 % (ref 11.5–15.5)
WBC: 11.9 10*3/uL — ABNORMAL HIGH (ref 4.0–10.5)
nRBC: 0 % (ref 0.0–0.2)

## 2020-11-21 LAB — BASIC METABOLIC PANEL
Anion gap: 10 (ref 5–15)
BUN: 26 mg/dL — ABNORMAL HIGH (ref 8–23)
CO2: 26 mmol/L (ref 22–32)
Calcium: 8.8 mg/dL — ABNORMAL LOW (ref 8.9–10.3)
Chloride: 100 mmol/L (ref 98–111)
Creatinine, Ser: 0.93 mg/dL (ref 0.44–1.00)
GFR, Estimated: >60 mL/min (ref >60)
Glucose, Bld: 138 mg/dL — ABNORMAL HIGH (ref 70–99)
Potassium: 3.4 mmol/L — ABNORMAL LOW (ref 3.5–5.1)
Sodium: 136 mmol/L (ref 135–145)

## 2020-11-21 LAB — ECHOCARDIOGRAM COMPLETE
Area-P 1/2: 4.29 cm2
S' Lateral: 1.6 cm

## 2020-11-21 LAB — PREALBUMIN: Prealbumin: 19.3 mg/dL (ref 18–38)

## 2020-11-21 LAB — RAPID URINE DRUG SCREEN, HOSP PERFORMED
Amphetamines: NOT DETECTED
Barbiturates: NOT DETECTED
Benzodiazepines: POSITIVE — AB
Cocaine: NOT DETECTED
Opiates: NOT DETECTED
Tetrahydrocannabinol: NOT DETECTED

## 2020-11-21 LAB — AMMONIA: Ammonia: 15 umol/L (ref 9–35)

## 2020-11-21 LAB — TROPONIN I (HIGH SENSITIVITY)
Troponin I (High Sensitivity): 26 ng/L — ABNORMAL HIGH (ref <18)
Troponin I (High Sensitivity): 26 ng/L — ABNORMAL HIGH (ref <18)

## 2020-11-21 LAB — MAGNESIUM: Magnesium: 1.3 mg/dL — ABNORMAL LOW (ref 1.7–2.4)

## 2020-11-21 LAB — ABO/RH: ABO/RH(D): O POS

## 2020-11-21 LAB — VITAMIN D 25 HYDROXY (VIT D DEFICIENCY, FRACTURES): Vit D, 25-Hydroxy: 11.54 ng/mL — ABNORMAL LOW (ref 30–100)

## 2020-11-21 LAB — CK: Total CK: 210 U/L (ref 38–234)

## 2020-11-21 LAB — PROTIME-INR
INR: 1.1 (ref 0.8–1.2)
Prothrombin Time: 13.7 seconds (ref 11.4–15.2)

## 2020-11-21 LAB — SARS CORONAVIRUS 2 (TAT 6-24 HRS): SARS Coronavirus 2: NEGATIVE

## 2020-11-21 LAB — ETHANOL: Alcohol, Ethyl (B): <10 mg/dL (ref <10)

## 2020-11-21 MED ORDER — LORAZEPAM 0.5 MG PO TABS
0.5000 mg | ORAL_TABLET | Freq: Four times a day (QID) | ORAL | Status: DC | PRN
  Administered 2020-11-21 – 2020-11-26 (×6): 0.5 mg via ORAL
  Filled 2020-11-21 (×7): qty 1

## 2020-11-21 MED ORDER — SODIUM CHLORIDE 0.9 % IV SOLN: Freq: Once | INTRAVENOUS | Status: AC

## 2020-11-21 MED ORDER — SODIUM CHLORIDE 0.9 % IV BOLUS
500.0000 mL | Freq: Once | INTRAVENOUS | Status: AC
  Administered 2020-11-21: 500 mL via INTRAVENOUS

## 2020-11-21 MED ORDER — ENOXAPARIN SODIUM 30 MG/0.3ML ~~LOC~~ SOLN: 30.0000 mg | Freq: Once | SUBCUTANEOUS | Status: DC

## 2020-11-21 MED ORDER — VENLAFAXINE HCL ER 37.5 MG PO CP24: 37.5000 mg | ORAL_CAPSULE | Freq: Every day | ORAL | Status: DC

## 2020-11-21 MED ORDER — HYDRALAZINE HCL 20 MG/ML IJ SOLN
10.0000 mg | INTRAMUSCULAR | Status: DC | PRN
  Administered 2020-11-21: 10 mg via INTRAVENOUS
  Filled 2020-11-21: qty 1

## 2020-11-21 MED ORDER — LORAZEPAM 2 MG/ML IJ SOLN: 0.5000 mg | Freq: Four times a day (QID) | INTRAMUSCULAR | Status: DC | PRN

## 2020-11-21 MED ORDER — NICOTINE 21 MG/24HR TD PT24
21.0000 mg | MEDICATED_PATCH | Freq: Every day | TRANSDERMAL | Status: DC
  Administered 2020-11-21 – 2020-11-27 (×6): 21 mg via TRANSDERMAL
  Filled 2020-11-21 (×6): qty 1

## 2020-11-21 MED ORDER — VENLAFAXINE HCL ER 75 MG PO CP24: 75.0000 mg | ORAL_CAPSULE | Freq: Every day | ORAL | Status: DC

## 2020-11-21 MED ORDER — MORPHINE SULFATE (PF) 2 MG/ML IV SOLN
0.5000 mg | INTRAVENOUS | Status: DC | PRN
  Administered 2020-11-21 (×4): 0.5 mg via INTRAVENOUS
  Filled 2020-11-21 (×4): qty 1

## 2020-11-21 MED ORDER — MUPIROCIN 2 % EX OINT
1.0000 "application " | TOPICAL_OINTMENT | Freq: Two times a day (BID) | CUTANEOUS | Status: AC
  Administered 2020-11-21 – 2020-11-25 (×5): 1 via NASAL
  Filled 2020-11-21: qty 22

## 2020-11-21 MED ORDER — METHOCARBAMOL 500 MG PO TABS: 500.0000 mg | ORAL_TABLET | Freq: Four times a day (QID) | ORAL | Status: DC | PRN

## 2020-11-21 MED ORDER — IPRATROPIUM BROMIDE HFA 17 MCG/ACT IN AERS
2.0000 | INHALATION_SPRAY | RESPIRATORY_TRACT | Status: DC
  Filled 2020-11-21: qty 12.9

## 2020-11-21 MED ORDER — HYDROCODONE-ACETAMINOPHEN 5-325 MG PO TABS
1.0000 | ORAL_TABLET | Freq: Four times a day (QID) | ORAL | Status: DC | PRN
Start: 2020-11-21 — End: 2020-11-22

## 2020-11-21 MED ORDER — ALBUTEROL SULFATE HFA 108 (90 BASE) MCG/ACT IN AERS
2.0000 | INHALATION_SPRAY | RESPIRATORY_TRACT | Status: DC | PRN
  Filled 2020-11-21: qty 6.7

## 2020-11-21 MED ORDER — LORAZEPAM 2 MG/ML IJ SOLN
0.5000 mg | Freq: Four times a day (QID) | INTRAMUSCULAR | Status: DC | PRN
  Administered 2020-11-21: 0.5 mg via INTRAVENOUS
  Filled 2020-11-21: qty 1

## 2020-11-21 MED ORDER — THIAMINE HCL 100 MG/ML IJ SOLN
100.0000 mg | Freq: Every day | INTRAMUSCULAR | Status: DC
  Administered 2020-11-21 – 2020-11-23 (×2): 100 mg via INTRAVENOUS
  Filled 2020-11-21 (×3): qty 2

## 2020-11-21 MED ORDER — LORAZEPAM 0.5 MG PO TABS: 0.5000 mg | ORAL_TABLET | ORAL | Status: DC | PRN

## 2020-11-21 MED ORDER — METHOCARBAMOL 1000 MG/10ML IJ SOLN
500.0000 mg | Freq: Four times a day (QID) | INTRAVENOUS | Status: DC | PRN
  Filled 2020-11-21: qty 5

## 2020-11-21 MED ORDER — POTASSIUM CHLORIDE 10 MEQ/100ML IV SOLN
10.0000 meq | INTRAVENOUS | Status: AC
  Administered 2020-11-21 (×2): 10 meq via INTRAVENOUS
  Filled 2020-11-21 (×2): qty 100

## 2020-11-21 MED ORDER — VENLAFAXINE HCL ER 75 MG PO CP24
112.5000 mg | ORAL_CAPSULE | Freq: Every day | ORAL | Status: DC
  Administered 2020-11-21 – 2020-11-27 (×6): 112.5 mg via ORAL
  Filled 2020-11-21 (×7): qty 1

## 2020-11-21 NOTE — H&P (Signed)
Mariah Bruce TFT:[id number] DOB: 1946/05/07 DOA: 11/20/2020     PCP: Girtha Rm, NP-C   Outpatient Specialists:  NONE    Patient arrived to ER on 11/20/20 at 2300 Referred by Attending Molpus, John, MD   Patient coming from: home Lives   With family (sister)    Chief Complaint:   Chief Complaint  Patient presents with  . Hip Injury    HPI: Mariah Bruce is a 75 y.o. female with medical history significant of CKD stage II, lung cancer, COPD, anxiety, depression, GERD, tobacco abuse    Presented with    Fall occurred on 18 January since then she have had significant hip pain.  EMS was called she was given 100 mcg of fentanyl. Patient is somewhat confused and unable to tell for sure when that she has fallen.  But she did note that she has had 6 significant left pelvic area pain is worse when she tries to sit up. Initially) noted to be hypoxic down to 81 on room air felt may be secondary to fentanyl.  Recently admitted for UTI treated with IV antibiotics and changed to cephalexin.  Completion of 7 days plan was for patient to follow-up with urology Patient has known history of lung cancer was diagnosed in 2014 and declined any further treatment at that time CT scan showed multiple lung nodules during her admission patient continued to decline any further work-up or treatment  Patient at this point stating she is not sure she wants to have operative intervention for her hip emphasized the patient that imaging showing left hip fracture not the result surgical intervention she will likely will be bedbound. Patient states she wishes to speak to her sister prior to proceeding Would like to go home if possible.  Attempted to contact family but no answer. Patient did agree to stay for now.  Patient states she wishes to be DO NOT RESUSCITATE DO NOT INTUBATE. She continues to smoke but denies drinking alcohol. Patient unsure what happened but states she does not think she lost  consciousness.  She just says I just fell.  But she is unsure when that happened.   Has been vaccinated against COVID and boosted   Initial COVID TEST   in house  PCR testing  Pending  Lab Results  Component Value Date   [place] NEGATIVE 11/13/2020     Regarding pertinent Chronic problems:      COPD - not  followed by pulmonology   not  on baseline oxygen       CKD stage II - baseline Cr 0.9 Estimated Creatinine Clearance: 38 mL/min (by C-G formula based on SCr of 0.93 mg/dL).  Lab Results  Component Value Date   CREATININE 0.93 11/20/2020   CREATININE 0.85 11/15/2020   CREATININE 0.83 11/14/2020    While in ER:  Chest x-ray showing small left pleural effusion but otherwise nonacute CT pelvis showed comminuted impacted nondisplaced transcervical left femoral neck fracture  ER Provider Called: Orthopedics   Dr.Hewitt  They Recommend admit to medicine keep n.p.o. postmidnight Will see in AM   Hospitalist was called for admission for femoral neck fracture  The following Work up has been ordered so far:  Orders Placed This Encounter  Procedures  . SARS CORONAVIRUS 2 (TAT 6-24 HRS) Nasopharyngeal Nasopharyngeal Swab  . Urine culture  . CT PELVIS WO CONTRAST  . DG CHEST PORT 1 VIEW  . CBC with Differential/Platelet  . Basic metabolic panel  . Urinalysis, Routine  w reflex microscopic  . Diet NPO time specified  . In and Out Cath  . Consult to hospitalist  ALL PATIENTS BEING ADMITTED/HAVING PROCEDURES NEED COVID-19 SCREENING  . Consult to orthopedic surgery  ALL PATIENTS BEING ADMITTED/HAVING PROCEDURES NEED COVID-19 SCREENING  . Airborne and Contact precautions  . EKG 12-Lead     Following Medications were ordered in ER: Medications  sodium chloride 0.9 % bolus 500 mL (has no administration in time range)  0.9 %  sodium chloride infusion (has no administration in time range)        Consult Orders  (From admission, onward)         Start     Ordered    11/21/20 0047  Consult to hospitalist  ALL PATIENTS BEING ADMITTED/HAVING PROCEDURES NEED COVID-19 SCREENING  Once       Comments: ALL PATIENTS BEING ADMITTED/HAVING PROCEDURES NEED COVID-19 SCREENING  Provider:  (Not yet assigned)  Question Answer Comment  Place call to: [facility]   Reason for Consult Admit      11/21/20 0046           Significant initial  Findings: Abnormal Labs Reviewed  CBC WITH DIFFERENTIAL/PLATELET - Abnormal; Notable for the following components:      Result Value   WBC 15.6 (*)    Neutro Abs 13.7 (*)    Abs Immature Granulocytes 0.08 (*)    All other components within normal limits  BASIC METABOLIC PANEL - Abnormal; Notable for the following components:   Potassium 3.4 (*)    Glucose, Bld 138 (*)    BUN 26 (*)    Calcium 8.8 (*)    All other components within normal limits  URINALYSIS, ROUTINE W REFLEX MICROSCOPIC - Abnormal; Notable for the following components:   Glucose, UA 50 (*)    Hgb urine dipstick MODERATE (*)    Ketones, ur 5 (*)    Protein, ur 100 (*)    Bacteria, UA RARE (*)    All other components within normal limits    Otherwise labs showing:    Recent Labs  Lab 11/14/20 0458 11/15/20 0505 11/20/20 2334  NA 139 138 136  K 3.8 3.5 3.4*  CO2 26 26 26   GLUCOSE 107* 82 138*  BUN 12 13 26*  CREATININE 0.83 0.85 0.93  CALCIUM 9.1 9.1 8.8*    Cr     Stable  from baseline see below Lab Results  Component Value Date   CREATININE 0.93 11/20/2020   CREATININE 0.85 11/15/2020   CREATININE 0.83 11/14/2020    No results for input(s): AST, ALT, ALKPHOS, BILITOT, PROT, ALBUMIN in the last 168 hours. Lab Results  Component Value Date   CALCIUM 8.8 (L) 11/20/2020      WBC      Component Value Date/Time   WBC 15.6 (H) 11/20/2020 2334   LYMPHSABS 0.9 11/20/2020 2334   MONOABS 0.6 11/20/2020 2334   EOSABS 0.2 11/20/2020 2334   BASOSABS 0.1 11/20/2020 2334    Plt: Lab Results  Component Value Date   PLT 151  11/20/2020     HG/HCT   Stable,     Component Value Date/Time   HGB 13.5 11/20/2020 2334   HCT 41.1 11/20/2020 2334   MCV 99.0 11/20/2020 2334     No results for input(s): AMMONIA in the last 168 hours.  Pending     Troponin ordered Cardiac Panel (last 3 results) No results for input(s): CKTOTAL, CKMB, TROPONINI, RELINDX in the last 72  hours.     ECG: Ordered Personally reviewed by me showing: HR :  100 Rhythm Sinus tachycardia  RBBB,    nonspecific changes,   QTC 483      UA   persistent hematuria   Urine analysis:    Component Value Date/Time   COLORURINE YELLOW 11/20/2020 2350   APPEARANCEUR CLEAR 11/20/2020 2350   LABSPEC 1.023 11/20/2020 2350   PHURINE 5.0 11/20/2020 2350   GLUCOSEU 50 (A) 11/20/2020 2350   HGBUR MODERATE (A) 11/20/2020 2350   BILIRUBINUR NEGATIVE 11/20/2020 2350   KETONESUR 5 (A) 11/20/2020 2350   PROTEINUR 100 (A) 11/20/2020 2350   NITRITE NEGATIVE 11/20/2020 2350   LEUKOCYTESUR NEGATIVE 11/20/2020 2350   Ordered   CXR - NON acute  CTabd/pelvis - Comminuted impacted nondisplaced transcervical left femoral neck fracture.     ED Triage Vitals  Enc Vitals Group     BP 11/20/20 2334 (!) 158/88     Pulse Rate 11/20/20 2334 98     Resp 11/20/20 2334 17     Temp 11/20/20 2334 98 F (36.7 C)     Temp src --      SpO2 11/20/20 2334 100 %     Weight --      Height --      Head Circumference --      Peak Flow --      Pain Score 11/20/20 2316 5     Pain Loc --      Pain Edu? --      Excl. in [place]? --   TMAX(24)@       Latest  Blood pressure (!) 156/86, pulse 100, temperature 98 F (36.7 C), resp. rate 20, SpO2 98 %.    Review of Systems:    Pertinent positives include: falls  Constitutional:  No weight loss, night sweats, Fevers, chills, fatigue, weight loss  HEENT:  No headaches, Difficulty swallowing,Tooth/dental problems,Sore throat,  No sneezing, itching, ear ache, nasal congestion, post nasal drip,  Cardio-vascular:  No  chest pain, Orthopnea, PND, anasarca, dizziness, palpitations.no Bilateral lower extremity swelling  GI:  No heartburn, indigestion, abdominal pain, nausea, vomiting, diarrhea, change in bowel habits, loss of appetite, melena, blood in stool, hematemesis Resp:  no shortness of breath at rest. No dyspnea on exertion, No excess mucus, no productive cough, No non-productive cough, No coughing up of blood.No change in color of mucus.No wheezing. Skin:  no rash or lesions. No jaundice GU:  no dysuria, change in color of urine, no urgency or frequency. No straining to urinate.  No flank pain.  Musculoskeletal:  No joint pain or no joint swelling. No decreased range of motion. No back pain.  Psych:  No change in mood or affect. No depression or anxiety. No memory loss.  Neuro: no localizing neurological complaints, no tingling, no weakness, no double vision, no gait abnormality, no slurred speech, no confusion  All systems reviewed and apart from [place] all are negative  Past Medical History:   Past Medical History:  Diagnosis Date  . Anxiety   . Chronic pain   . COPD (chronic obstructive pulmonary disease) ([place])   . Depression   . GERD (gastroesophageal reflux disease)   . Lung cancer [facility])       Past Surgical History:  Procedure Laterality Date  . BACK SURGERY    . HERNIA REPAIR    . TONSILLECTOMY      Social History:  Ambulatory   Independently      reports  that she has been smoking cigarettes. She has a 55.00 pack-year smoking history. She has never used smokeless tobacco. She reports current alcohol use. She reports that she does not use drugs.   Family History:   Family History  Problem Relation Age of Onset  . Hypertension Other     Allergies: Allergies  Allergen Reactions  . Penicillin G Rash     Prior to Admission medications   Medication Sig Start Date End Date Taking? Authorizing Provider  amitriptyline (ELAVIL) 10 MG tablet Take 1 tablet (10 mg total)  by mouth at bedtime. 11/15/20 12/15/20 Yes Arrien, Jimmy Picket, MD  citalopram (CELEXA) 20 MG tablet Take 1.5 tablets (30 mg total) by mouth daily. Taking 1 & 1/2 tab daily = 30 mg 11/15/20 12/15/20 Yes Arrien, Jimmy Picket, MD  famotidine (PEPCID) 40 MG tablet Take 1 tablet (40 mg total) by mouth daily for 30 doses. 11/15/20 12/15/20 Yes Arrien, Jimmy Picket, MD  gabapentin (NEURONTIN) 300 MG capsule Take 1-2 capsules (300-600 mg total) by mouth See admin instructions. Taking 300 mg in the Am and 600 mg at bedtime 11/15/20  Yes Arrien, Jimmy Picket, MD  ibuprofen (ADVIL) 200 MG tablet Take 400 mg by mouth every 6 (six) hours as needed for mild pain.   Yes [provider]  LORazepam (ATIVAN) 0.5 MG tablet Take 0.5-1 mg by mouth every 4 (four) hours as needed for anxiety (Shortness of breath).   Yes [provider]  oxyCODONE-acetaminophen (PERCOCET/ROXICET) 5-325 MG tablet Take 2 tablets by mouth every 4 (four) hours as needed for moderate pain.   Yes [provider]  senna-docusate (SENOKOT-S) 8.6-50 MG tablet Take 2 tablets by mouth 2 (two) times daily.   Yes [provider]  venlafaxine XR (EFFEXOR-XR) 37.5 MG 24 hr capsule Take 1 capsule (37.5 mg total) by mouth daily. Taking with 75 mg daily = 112.5 mg 11/15/20 12/15/20 Yes Arrien, Jimmy Picket, MD  venlafaxine XR (EFFEXOR-XR) 75 MG 24 hr capsule Take 1 capsule (75 mg total) by mouth daily with breakfast. Taking with 37.5 mg daily = 112.5 mg 11/15/20 12/15/20 Yes Arrien, Jimmy Picket, MD   Physical Exam: Vitals with BMI 11/21/2020 11/21/2020 11/20/2020  Height - - -  Weight - - -  BMI - - -  Systolic 419 379 024  Diastolic 86 83 78  Pulse 097 - 97     1. General:  in No  Acute distress    Chronically ill  cachectic  -appearing 2. Psychological: Alert and  Oriented to self situation not date 3. Head/ENT:    Dry Mucous Membranes                          Head Non traumatic, neck supple                             Poor Dentition 4. SKIN:   decreased Skin turgor,  Skin clean Dry and intact no rash 5. Heart: Regular rate and rhythm no  Murmur, no Rub or gallop 6. Lungs:  no wheezes or crackles   7. Abdomen: Soft,  non-tender, Non distended  bowel sounds present 8. Lower extremities: no clubbing, cyanosis, no  edema 9. Neurologically Grossly intact, moving all 4 extremities equally somewhat diminished spontaneous movement in left lower extremity  10. MSK: Normal range of motion except for left lower extremity   All other LABS:  Recent Labs  Lab 11/14/20 0458 11/15/20 0505 11/20/20 2334  WBC 7.6 8.4 15.6*  NEUTROABS  --  5.3 13.7*  HGB 12.2 11.6* 13.5  HCT 35.5* 33.7* 41.1  MCV 95.9 96.3 99.0  PLT 237 221 151     Recent Labs  Lab 11/14/20 0458 11/15/20 0505 11/20/20 2334  NA 139 138 136  K 3.8 3.5 3.4*  CL 101 102 100  CO2 26 26 26   GLUCOSE 107* 82 138*  BUN 12 13 26*  CREATININE 0.83 0.85 0.93  CALCIUM 9.1 9.1 8.8*     No results for input(s): AST, ALT, ALKPHOS, BILITOT, PROT, ALBUMIN in the last 168 hours.     Cultures:    Component Value Date/Time   SDES  11/13/2020 0152    BLOOD LEFT HAND Performed at [facility] [address] [place], [place] [address]    [facility]  11/13/2020 0152    BOTTLES DRAWN AEROBIC AND ANAEROBIC LEFT HAND Performed at [facility], [address], [place], [place] [address]    CULT  11/13/2020 0152    NO GROWTH 5 DAYS Performed at [facility], [place] [address], [place], [place] [address]    REPTSTATUS 11/18/2020 FINAL 11/13/2020 0152     Radiological Exams on Admission: CT PELVIS WO CONTRAST  Result Date: 11/21/2020 CLINICAL DATA:  Hip pain after fall EXAM: CT PELVIS WITHOUT CONTRAST TECHNIQUE: Multidetector CT imaging of the pelvis was performed following the standard protocol without intravenous contrast. COMPARISON:  None. FINDINGS: Urinary Tract: The visualized distal ureters and bladder  appear unremarkable. Bowel: No bowel wall thickening, distention or surrounding inflammation identified within the pelvis. Vascular/Lymphatic: No enlarged pelvic lymph nodes identified. Scattered aortic atherosclerosis is noted. Reproductive: The uterus and adnexa are unremarkable. Other: There is a soft tissue calcifications seen overlying the left inferior ischial tuberosity. Musculoskeletal: There is a comminuted slightly impacted nondisplaced fracture seen through the left transcervical femoral neck. The femoral head is still well seated within the acetabulum. A small hip joint effusion is seen. There is diffuse osteopenia. Again noted are degenerative changes in the lower lumbar spine. The sacroiliac joints appear to be intact. IMPRESSION: Comminuted impacted nondisplaced transcervical left femoral neck fracture. Electronically Signed   By: Prudencio Pair M.D.   On: 11/21/2020 00:13   DG CHEST PORT 1 VIEW  Result Date: 11/21/2020 CLINICAL DATA:  Hypoxia EXAM: PORTABLE CHEST 1 VIEW COMPARISON:  November 13, 2020 FINDINGS: The heart size and mediastinal contours are mildly enlarged. Aortic knob calcifications are seen. Again noted is increased interstitial markings with fibrosis and scarring at the right lung apex. A unchanged small left pleural effusion is seen. No acute osseous abnormality. IMPRESSION: No significant change with the right apical scarring and fibrosis Small left pleural effusion Electronically Signed   By: Prudencio Pair M.D.   On: 11/21/2020 00:16    Chart has been reviewed    Assessment/Plan   75 y.o. female with medical history significant of CKD stage II, lung cancer, COPD, anxiety, depression, GERD, tobacco abuse     Admitted for left hip fracture  Present on Admission: . Closed left hip fracture ([place]) -  - management as per orthopedics, will see in consult in a.m.       Keep nothing by mouth post midnight. Patient  not on anticoagulation or antiplatelet agents   Ordered  type and screen,   order a vitamin D level  Patient at baseline  able to  walk a flight of stairs or 100 feet.  Patient's history      Patient denies any chest pain or shortness of breath currently and/or with exertion,   ECG showing right bundle branch block and nonspecific ST segment changes  no known history of coronary artery disease, but history of COPD and CKD  Given advanced age patient is at least moderate  risk unable to discus  with family  Given somewhat abnormal EKG and unclear etiology of fall   will order echo   cycle CE  If persistent hypoxia this need to be taken in consideration regarding choice of anesthesia If echogram is abnormal, significantly elevated cardiac markers persistently abnormal EKG would need cardiology consult for further pre-op clearance given extensive cardiac disease   . Tobacco abuse -at this point patient is not interested in quitting. Order tobacco cessation protocol  . Protein-calorie malnutrition, severe check prealbumin order nutritional consult Administer thiamine . Lung cancer [facility]) patient confirms she is not interested in further work-up at this time  . Hematuria -this point UA not indicative of infectious process we will continue to follow patient has completed antibiotics  . Acute metabolic encephalopathy -unsure what baseline is. Patient unable to provide full detailed history not oriented to time Unsure if she has underlying dementia or other process Will need corroborative history from family in AM. Will obtain ammonia level Treat pain. Rehydrate Obtain CT head given the patient had a fall and unable to provide detailed history of how it occurred  Benzodiazepine use.  Patient Ativan at home monitor for any signs of withdrawal Ativan as needed changed to IV while n.p.o. Check urine toxicity screen if negative for benzodiazepine would monitor carefully for any sign of withdrawal  . Hypokalemia -replete and check magnesium level as  well as phosphate level  . Abnormal ECG patient denies any chest pain will repeat EKG cycle cardiac enzymes obtain echogram  . COPD (chronic obstructive pulmonary disease) ([facility])  Currently stable we will provide albuterol as needed and Atrovent scheduled Transient hypoxia could be potentially in the setting of COPD versus fentanyl administration we will continue to monitor and titrate as needed Chest x-ray showing no evidence of acute infarct    Leukocytosis -in the setting of hip fracture most likely reactive continue to monitor Other plan as per orders.  DVT prophylaxis:  SCD   Code Status:    Code Status: Prior   DNR/DNI as per patient    I had personally discussed CODE STATUS with patient     Family Communication:   Family not at  Bedside Attempted to contact family on the phone no answer   Disposition Plan:       likely will need placement for rehabilitation                             Following barriers for discharge:                            Electrolytes corrected                                                            Pain controlled with PO medications                         ,  white count improving                              Will need to be able to tolerate PO                            Will likely need home health, home O2, set up                           Will need consultants to evaluate patient prior to discharge                                      Swallow eval - SLP ordered                                       Transition of care consulted                   Nutrition    consulted                                     Palliative care    consulted                  Consults called: Orthopedics aware  Admission status:  ED Disposition    ED Disposition Condition [place]: [place] [[id number]]  Level of Care: Telemetry [5]  Admit to tele based on following criteria: Monitor for Ischemic changes  May admit  patient to [facility] or [facility] if equivalent level of care is available:: No  Covid Evaluation: Asymptomatic Screening Protocol (No Symptoms)  Diagnosis: Closed left hip fracture [facility]) [[id number]]  Admitting Physician: Toy Baker [[id number]]  Attending Physician: Toy Baker [[id number]]  Estimated length of stay: past midnight tomorrow  Certification:: I certify this patient will need inpatient services for at least 2 midnights          inpatient     I Expect 2 midnight stay secondary to severity of patient's current illness need for inpatient interventions justified by the following:  hemodynamic instability despite optimal treatment (tachycardia  Hypoxia )   Severe lab/radiological/exam abnormalities including:   Left hip fracture and extensive comorbidities including:     COPD/asthma     CKD  s    malignancy, .    That are currently affecting medical management.   I expect  patient to be hospitalized for 2 midnights requiring inpatient medical care.  Patient is at high risk for adverse outcome (such as loss of life or disability) if not treated.  Indication for inpatient stay as follows:  Severe change from baseline regarding mental status   severe pain requiring acute inpatient management,  inability to maintain oral hydration    Need for operative/procedural  intervention New or worsening hypoxia  Need for  , IV fluids, IV rate  , IV pain medications,      Level of care    tele  For  24H    Lab Results  Component Value Date   [place] NEGATIVE 11/13/2020  Precautions: admitted as  asymptomatic screening protocol  PPE: Used by the provider:    P100  eye Goggles,  Gloves     Shakinah Navis 11/21/2020, 2:18 AM    [facility]     after 2 AM please page floor coverage PA If 7AM-7PM, please contact the day team taking care of the patient using [email or web address]   Patient was evaluated in the context of the global COVID-19  pandemic, which necessitated consideration that the patient might be at risk for infection with the SARS-CoV-2 virus that causes COVID-19. Institutional protocols and algorithms that pertain to the evaluation of patients at risk for COVID-19 are in a state of rapid change based on information released by regulatory bodies including the CDC and federal and state organizations. These policies and algorithms were followed during the patient's care.

## 2020-11-21 NOTE — Progress Notes (Signed)
PROGRESS NOTE    Mariah Bruce  PTW:[id number] DOB: [birth year] DOA: 11/20/2020 PCP: Girtha Rm, NP-C   Brief Narrative:   Patient is a 75 y.o. female who presented to the ED with complaint of left hip pain secondary to a fall which occurred on 11/19/20.  Since the recent fall, she has been experiencing pain the left hip and was transported by EMS to [facility] ED on 11/20/20. Patient has a past medical history significant for CKD stage II, lung cancer, COPD, anxiety, depression, GERD, tobacco abuse.  Patient has been confused since admission and can not accurately recall the events or timing of the fall.  She is alert t to person, place, and event but not to time. The patient was recently hospitalized with a UTI and treated with IV antibiotics and discharged on 11/15/20 with an outpatient follow up at 7 days post discharge.   Patient has a 55 pack year history of tobacco use and is a current smoker. Patient was diagnosed with lung cancer in 2014. CT on recent admission showed multiple lung nodules. Patient has declined treatment for her lung cancer diagnosis.   ER Course:  Chest x-ray showing small left pleural effusion but otherwise nonacute CT pelvis showed comminuted impacted nondisplaced transcervical left femoral neck fracture.  Patient is currently not answering when questioned about if she wants to proceed with surgery to replace the left hip. She repeated asks, "where's my sister".  Orthopedics was consulted.   Assessment & Plan:   Active Problems:   Hematuria   Tobacco abuse   Lung cancer (HCC)   Acute metabolic encephalopathy   Protein-calorie malnutrition, severe   Closed left hip fracture (HCC)   Hypokalemia   Abnormal ECG   COPD (chronic obstructive pulmonary disease) (HCC)   Abnormal EKG    Nondisplaced left femoral neck fracture -Patient able to walk a flight of stairs or 100 ft at baseline, not currently an anticoagulants. -Type and screen  performed. -Vitamin D decreased at 11.54. -CT pelvis indicates left hip fracture, orthopedics consulted, with recommendation provided to plan on left hemiarthroplasty for 11/22/20. -Per orthopedics, patient is likely to be bed bound without procedure -Patient ambivalent about going through with procedure and both requests "where is her sister" and want to talk with sister before moving forward.   -NPO after midnight per orthopedics.  Attempts made by orthopedics to contact patient's sister to obtain consent. -One dose 30 mg Lovenox sq today for DVT prophylaxis. -Orthopedics discussed with patient's sister and she agrees to surgery.  Hypertensive urgency -B/P on admission 196/95 -Hydralazine 10 mg, as needed q4 hrs for B/P >160/100 -Continue to monitor  Tobacco abuse -55 pack year history and current smoker -Patient currently is not interested in quitting. -Recommend tobacco cessation counseling. -Continue nicotine patch.  Protein-calorie malnutrition, severe -Albumin 3.5 ->3.5->3.0 -Continue IV thiamine -Nutritional consult  Lung cancer -Patient declined further work-up in the past  Hematuria  -UTI, recent admission, unlikely infectious process at this time -Completed course of prescribed po antibiotics as outpatient  Acute metabolic encephalopathy  -Baseline mental status unknown as patient is unable to provide complete history and is disoriented to time -?Underlying dementia or other disease process -Ammonia level 15 -Attempt to contact family for further history. -CT head consistent with age related atrophy and chronic small vessel ischemia. -Delirium precautions, manage pain, ensure adequate hydration.  Benzodiazepine use.   -Home use of Ativan, UDS positive for benzodiazepines. -Continue Ativan IV as needed for agitation while n.p.o.  Hypokalemia -On admission, potassium 3.4, replete and recheck BMET. -Magnesium and phospate levels, 1.3 and 2.2  respectively.  Abnormal ECG -ECG shows right bundle branch block and nonspecific ST segment changes -Patient denies any chest pain or cardiac symptoms -Echo done 11/21/20, EF 65-70 %, no regional wall motion abnormalities noted. There is abnormal structure on the tricuspid annulus.   COPD (chronic obstructive pulmonary disease), chronic/stable -PRN albuterol as needed and Atrovent scheduled -Chest x-ray showing no evidence of acute infarct, with small left pleural effusion.   Leukocytosis -in the setting of acute hip fracture -WBC's 16.5 ->11.9 -Liikely reactive, continue to monitor    DVT prophylaxis: SCD's Code Status: DNR Family Communication:  Attempts made by Orthopedics provider to contact patient's sister.  No family at bedside.  Status is: Inpatient    Dispo: The patient is from: Home              Anticipated d/c is to: Home              Anticipated d/c date is: 11/25/20              Patient currently is not medically stable for discharge.   There is no height or weight on file to calculate BMI.   Consultants:   Orthopedics  Procedures:   Left hemiarthroplasty for 11/22/20 if consent obtained.  Antimicrobials:   None    Subjective:  Patient was examined and assessed at bedside was alert to self, place, situation, but not time.  Patient is asking for her sister and has pain in her left hip.  Review of Systems Otherwise negative except as per HPI, including: General: Denies fever, chills, night sweats.  Appears chronically-ill, cachetic. Resp: Denies cough, wheezing, shortness of breath. Cardiac: Denies chest pain, palpitations, orthopnea, paroxysmal nocturnal dyspnea. GI: Denies abdominal pain, nausea, vomiting, diarrhea or constipation GU: Denies dysuria, frequency, hesitancy or incontinence MS: Reports left hip pain and tenderness, limited ROM in her lower left hip and extremity. Neuro: Denies headache, neurologic deficits (focal weakness, numbness,  tingling), abnormal gait Psych: Denies anxiety, depression, SI/HI/AVH Skin: Denies new rashes or lesions ID: Denies sick contacts, exotic exposures, travel  Examination:  General exam: Appears calm and comfortable, NAD. Respiratory system: Clear to auscultation. Respiratory effort normal. Cardiovascular system: S1 & S2 heard, RRR. No JVD, murmurs, rubs, gallops or clicks. No pedal edema. Gastrointestinal system: Abdomen is nondistended, soft and nontender. No organomegaly or masses felt. Normal bowel sounds heard. Central nervous system: Alert and oriented. No focal neurological deficits. Musculoskeletal: Left hip tender to palpation, limited active and passive ROM. Skin: No rashes, lesions or ulcers Psychiatry: Judgement and insight appear normal. Mood & affect appropriate.     Objective: Vitals:   11/21/20 1230 11/21/20 1400 11/21/20 1430 11/21/20 1500  BP: (!) 175/80 (!) 158/77 (!) 149/78 (!) 156/83  Pulse: 87 89 94 89  Resp: 18 19 (!) 22 20  Temp:      TempSrc:      SpO2: 99% 92% 96% 97%   No intake or output data in the 24 hours ending 11/21/20 1539 There were no vitals filed for this visit.   Data Reviewed:   CBC: Recent Labs  Lab 11/15/20 0505 11/20/20 2334 11/21/20 0444  WBC 8.4 15.6* 11.9*  NEUTROABS 5.3 13.7*  --   HGB 11.6* 13.5 11.8*  HCT 33.7* 41.1 35.9*  MCV 96.3 99.0 98.4  PLT 221 151 213*   Basic Metabolic Panel: Recent Labs  Lab 11/15/20 0505 11/20/20  2334 11/21/20 0145 11/21/20 0444  NA 138 136  --  136  K 3.5 3.4*  --  3.5  CL 102 100  --  102  CO2 26 26  --  26  GLUCOSE 82 138*  --  107*  BUN 13 26*  --  24*  CREATININE 0.85 0.93  --  0.70  CALCIUM 9.1 8.8*  --  8.3*  MG  --   --  1.3*  --   PHOS  --   --  2.2*  --    GFR: Estimated Creatinine Clearance: 44.2 mL/min (by C-G formula based on SCr of 0.7 mg/dL). Liver Function Tests: Recent Labs  Lab 11/21/20 0145 11/21/20 0444  AST 27 23  ALT 29 25  ALKPHOS 103 88  BILITOT  1.0 0.9  PROT 6.1* 5.3*  ALBUMIN 3.5 3.0*   No results for input(s): LIPASE, AMYLASE in the last 168 hours. Recent Labs  Lab 11/21/20 0145  AMMONIA 15   Coagulation Profile: Recent Labs  Lab 11/21/20 0145  INR 1.1   Cardiac Enzymes: Recent Labs  Lab 11/21/20 0145  CKTOTAL 210   BNP (last 3 results) No results for input(s): PROBNP in the last 8760 hours. HbA1C: No results for input(s): HGBA1C in the last 72 hours. CBG: No results for input(s): GLUCAP in the last 168 hours. Lipid Profile: No results for input(s): CHOL, HDL, LDLCALC, TRIG, CHOLHDL, LDLDIRECT in the last 72 hours. Thyroid Function Tests: No results for input(s): TSH, T4TOTAL, FREET4, T3FREE, THYROIDAB in the last 72 hours. Anemia Panel: No results for input(s): VITAMINB12, FOLATE, FERRITIN, TIBC, IRON, RETICCTPCT in the last 72 hours. Sepsis Labs: No results for input(s): PROCALCITON, LATICACIDVEN in the last 168 hours.  Recent Results (from the past 240 hour(s))  Blood culture (routine x 2)     Status: None   Collection Time: 11/13/20  1:00 AM   Specimen: BLOOD LEFT FOREARM  Result Value Ref Range Status   Specimen Description   Final    BLOOD LEFT FOREARM Performed at [facility], [place] [address], [place], [place] [address]    Special Requests   Final    BOTTLES DRAWN AEROBIC AND ANAEROBIC BLOOD LEFT FOREARM Performed at [facility], [address] [place], [place] [address]    Culture   Final    NO GROWTH 5 DAYS Performed at [facility], [place] [address] [place], [place] [address]    Report Status 11/18/2020 FINAL  Final  Blood culture (routine x 2)     Status: None   Collection Time: 11/13/20  1:52 AM   Specimen: BLOOD LEFT HAND  Result Value Ref Range Status   Specimen Description   Final    BLOOD LEFT HAND Performed at [facility], [place] [address] [place], [place] [address]    Special Requests   Final    BOTTLES DRAWN AEROBIC AND ANAEROBIC LEFT  HAND Performed at [facility], [address] [place], [place] [address]    Culture   Final    NO GROWTH 5 DAYS Performed at [facility], [place] [address], [place], [place] [address]    Report Status 11/18/2020 FINAL  Final  Resp Panel by RT-PCR (Flu A&B, Covid) Nasopharyngeal Swab     Status: None   Collection Time: 11/13/20  2:26 AM   Specimen: Nasopharyngeal Swab; Nasopharyngeal(NP) swabs in vial transport medium  Result Value Ref Range Status  SARS Coronavirus 2 by RT PCR NEGATIVE NEGATIVE Final    Comment: (NOTE) SARS-CoV-2 target nucleic acids are NOT DETECTED.  The SARS-CoV-2 RNA is generally detectable in upper respiratory specimens during the acute phase of infection. The lowest concentration of SARS-CoV-2 viral copies this assay can detect is 138 copies/mL. A negative result does not preclude SARS-Cov-2 infection and should not be used as the sole basis for treatment or other patient management decisions. A negative result may occur with  improper specimen collection/handling, submission of specimen other than nasopharyngeal swab, presence of viral mutation(s) within the areas targeted by this assay, and inadequate number of viral copies(<138 copies/mL). A negative result must be combined with clinical observations, patient history, and epidemiological information. The expected result is Negative.  Fact Sheet for Patients:  EntrepreneurPulse.com.au  Fact Sheet for Healthcare Providers:  IncredibleEmployment.be  This test is no t yet approved or cleared by the [place] FDA and  has been authorized for detection and/or diagnosis of SARS-CoV-2 by FDA under an Emergency Use Authorization (EUA). This EUA will remain  in effect (meaning this test can be used) for the duration of the COVID-19 declaration under Section 564(b)(1) of the Act, 21 U.S.C.section 360bbb-3(b)(1), unless the authorization is terminated  or  revoked sooner.       Influenza A by PCR NEGATIVE NEGATIVE Final   Influenza B by PCR NEGATIVE NEGATIVE Final    Comment: (NOTE) The Xpert Xpress SARS-CoV-2/FLU/RSV plus assay is intended as an aid in the diagnosis of influenza from Nasopharyngeal swab specimens and should not be used as a sole basis for treatment. Nasal washings and aspirates are unacceptable for Xpert Xpress SARS-CoV-2/FLU/RSV testing.  Fact Sheet for Patients: EntrepreneurPulse.com.au  Fact Sheet for Healthcare Providers: IncredibleEmployment.be  This test is not yet approved or cleared by the [place] FDA and has been authorized for detection and/or diagnosis of SARS-CoV-2 by FDA under an Emergency Use Authorization (EUA). This EUA will remain in effect (meaning this test can be used) for the duration of the COVID-19 declaration under Section 564(b)(1) of the Act, 21 U.S.C. section 360bbb-3(b)(1), unless the authorization is terminated or revoked.  Performed at [facility], [address] [place], [place] [address]   SARS CORONAVIRUS 2 (TAT 6-24 HRS) Nasopharyngeal Nasopharyngeal Swab     Status: None   Collection Time: 11/21/20 12:16 AM   Specimen: Nasopharyngeal Swab  Result Value Ref Range Status   SARS Coronavirus 2 NEGATIVE NEGATIVE Final    Comment: (NOTE) SARS-CoV-2 target nucleic acids are NOT DETECTED.  The SARS-CoV-2 RNA is generally detectable in upper and lower respiratory specimens during the acute phase of infection. Negative results do not preclude SARS-CoV-2 infection, do not rule out co-infections with other pathogens, and should not be used as the sole basis for treatment or other patient management decisions. Negative results must be combined with clinical observations, patient history, and epidemiological information. The expected result is Negative.  Fact Sheet for Patients: SugarRoll.be  Fact  Sheet for Healthcare Providers: https://www.woods-mathews.com/  This test is not yet approved or cleared by the [place] FDA and  has been authorized for detection and/or diagnosis of SARS-CoV-2 by FDA under an Emergency Use Authorization (EUA). This EUA will remain  in effect (meaning this test can be used) for the duration of the COVID-19 declaration under Se ction 564(b)(1) of the Act, 21 U.S.C. section 360bbb-3(b)(1), unless the authorization is terminated or revoked sooner.  Performed at [facility], [address] [place], [place] [address]          Radiology Studies: CT HEAD WO CONTRAST  Result Date: 11/21/2020 CLINICAL DATA:  Patient fall at home EXAM: CT HEAD WITHOUT CONTRAST TECHNIQUE: Contiguous axial images were obtained from the base of the skull through the vertex without intravenous contrast. COMPARISON:  November 14, 2020 FINDINGS: Brain: No evidence of acute territorial infarction, hemorrhage, hydrocephalus,extra-axial collection or mass lesion/mass effect. There is dilatation the ventricles and sulci consistent with age-related atrophy. Low-attenuation changes in the deep white matter consistent with small vessel ischemia. Vascular: No hyperdense vessel or unexpected calcification. Skull: The skull is intact. No fracture or focal lesion identified. Sinuses/Orbits: The visualized paranasal sinuses and mastoid air cells are clear. The orbits and globes intact. Other: None IMPRESSION: No acute intracranial abnormality. Findings consistent with age related atrophy and chronic small vessel ischemia Electronically Signed   By: Prudencio Pair M.D.   On: 11/21/2020 02:44   CT PELVIS WO CONTRAST  Result Date: 11/21/2020 CLINICAL DATA:  Hip pain after fall EXAM: CT PELVIS WITHOUT CONTRAST TECHNIQUE: Multidetector CT imaging of the pelvis was performed following the standard protocol without intravenous contrast. COMPARISON:  None. FINDINGS: Urinary Tract: The  visualized distal ureters and bladder appear unremarkable. Bowel: No bowel wall thickening, distention or surrounding inflammation identified within the pelvis. Vascular/Lymphatic: No enlarged pelvic lymph nodes identified. Scattered aortic atherosclerosis is noted. Reproductive: The uterus and adnexa are unremarkable. Other: There is a soft tissue calcifications seen overlying the left inferior ischial tuberosity. Musculoskeletal: There is a comminuted slightly impacted nondisplaced fracture seen through the left transcervical femoral neck. The femoral head is still well seated within the acetabulum. A small hip joint effusion is seen. There is diffuse osteopenia. Again noted are degenerative changes in the lower lumbar spine. The sacroiliac joints appear to be intact. IMPRESSION: Comminuted impacted nondisplaced transcervical left femoral neck fracture. Electronically Signed   By: Prudencio Pair M.D.   On: 11/21/2020 00:13   DG CHEST PORT 1 VIEW  Result Date: 11/21/2020 CLINICAL DATA:  Hypoxia EXAM: PORTABLE CHEST 1 VIEW COMPARISON:  November 13, 2020 FINDINGS: The heart size and mediastinal contours are mildly enlarged. Aortic knob calcifications are seen. Again noted is increased interstitial markings with fibrosis and scarring at the right lung apex. A unchanged small left pleural effusion is seen. No acute osseous abnormality. IMPRESSION: No significant change with the right apical scarring and fibrosis Small left pleural effusion Electronically Signed   By: Prudencio Pair M.D.   On: 11/21/2020 00:16   DG Knee Left Port  Result Date: 11/21/2020 CLINICAL DATA:  Fall. EXAM: PORTABLE LEFT KNEE - 1-2 VIEW COMPARISON:  No prior. FINDINGS: Diffuse osteopenia. Mild degenerative change with chondrocalcinosis. No acute bony abnormality. No evidence of fracture dislocation. No prominent effusion. IMPRESSION: Diffuse osteopenia. Mild degenerative change with chondrocalcinosis. No acute abnormality identified.  Electronically Signed   By: Marcello Moores  Register   On: 11/21/2020 08:43   ECHOCARDIOGRAM COMPLETE  Result Date: 11/21/2020    ECHOCARDIOGRAM REPORT   Patient Name:   ALANTA SCOBEY Date of Exam: 11/21/2020 Medical Rec #:  [id number]        Height:       68.7 in Accession #:    [id number]       Weight:       100.0 lb Date of Birth:  September 20, 1946        BSA:          1.534 m Patient Age:  74 years         BP:           197/93 mmHg Patient Gender: F                HR:           89 bpm. Exam Location:  Inpatient Procedure: 2D Echo Indications:    Abnormal ECG R94.31  History:        Patient has no prior history of Echocardiogram examinations.                 COPD.  Sonographer:    Mikki Santee RDCS (AE) Referring Phys: [place]  1. Left ventricular ejection fraction, by estimation, is 65 to 70%. The left ventricle has hyperdynamic function. The left ventricle has no regional wall motion abnormalities. Left ventricular diastolic parameters were normal.  2. Right ventricular systolic function is normal. The right ventricular size is normal. There is mildly elevated pulmonary artery systolic pressure.  3. The mitral valve is normal in structure. No evidence of mitral valve regurgitation. No evidence of mitral stenosis.  4. The aortic valve is grossly normal. Aortic valve regurgitation is not visualized. No aortic stenosis is present.  5. The inferior vena cava is normal in size with greater than 50% respiratory variability, suggesting right atrial pressure of 3 mmHg. Comparison(s): No prior Echocardiogram. Conclusion(s)/Recommendation(s): There is an abnormal structure on the tricuspid annulus (vs adjacent RA/RV). This is seen well on images 76 and 77, however not well appreciated in other views. Could consider additional imaging such as cardiac MRI if it would change clinical management. Findings communicated to Dr. Darrick Meigs at 2:38 PM. FINDINGS  Left Ventricle: LVOT peak gradient 32 mmHg. Left  ventricular ejection fraction, by estimation, is 65 to 70%. The left ventricle has hyperdynamic function. The left ventricle has no regional wall motion abnormalities. The left ventricular internal cavity  size was normal in size. There is no left ventricular hypertrophy. Left ventricular diastolic parameters were normal. Right Ventricle: The right ventricular size is normal. Right vetricular wall thickness was not well visualized. Right ventricular systolic function is normal. There is mildly elevated pulmonary artery systolic pressure. The tricuspid regurgitant velocity  is 2.93 m/s, and with an assumed right atrial pressure of 3 mmHg, the estimated right ventricular systolic pressure is 47.0 mmHg. Left Atrium: Left atrial size was normal in size. Right Atrium: Right atrial size was not well visualized. Prominent Eustachian valve. Pericardium: Trivial pericardial effusion is present. Mitral Valve: The mitral valve is normal in structure. No evidence of mitral valve regurgitation. No evidence of mitral valve stenosis. Tricuspid Valve: The tricuspid valve is normal in structure. Tricuspid valve regurgitation is mild. Aortic Valve: The aortic valve is grossly normal. Aortic valve regurgitation is not visualized. No aortic stenosis is present. Pulmonic Valve: The pulmonic valve was not well visualized. Pulmonic valve regurgitation is not visualized. Aorta: The aortic root and ascending aorta are structurally normal, with no evidence of dilitation. Venous: The inferior vena cava is normal in size with greater than 50% respiratory variability, suggesting right atrial pressure of 3 mmHg. IAS/Shunts: The interatrial septum was not well visualized.  LEFT VENTRICLE PLAX 2D LVIDd:         2.40 cm  Diastology LVIDs:         1.60 cm  LV e' medial:    7.18 cm/s LV PW:         0.90 cm  LV E/e' medial:  11.4 LV IVS:        0.80 cm  LV e' lateral:   8.81 cm/s LVOT diam:     1.80 cm  LV E/e' lateral: 9.3 LV SV:         53 LV SV  Index:   34 LVOT Area:     2.54 cm  RIGHT VENTRICLE TAPSE (M-mode): 2.0 cm LEFT ATRIUM             Index LA diam:        2.20 cm 1.43 cm/m LA Vol (A2C):   15.3 ml 9.97 ml/m LA Vol (A4C):   15.3 ml 9.97 ml/m LA Biplane Vol: 16.9 ml 11.01 ml/m  AORTIC VALVE LVOT Vmax:   134.00 cm/s LVOT Vmean:  90.700 cm/s LVOT VTI:    0.208 m  AORTA Ao Root diam: 2.30 cm MITRAL VALVE                TRICUSPID VALVE MV Area (PHT): 4.29 cm     TR Peak grad:   34.3 mmHg MV Decel Time: 177 msec     TR Vmax:        293.00 cm/s MV E velocity: 81.70 cm/s MV A velocity: 152.00 cm/s  SHUNTS MV E/A ratio:  0.54         Systemic VTI:  0.21 m                             Systemic Diam: 1.80 cm Buford Dresser MD Electronically signed by Buford Dresser MD Signature Date/Time: 11/21/2020/2:39:37 PM    Final    DG HIP PORT UNILAT WITH PELVIS 1V LEFT  Result Date: 11/21/2020 CLINICAL DATA:  Fall yesterday with hip injury EXAM: DG HIP (WITH OR WITHOUT PELVIS) 1V PORT LEFT COMPARISON:  None. FINDINGS: Limited study due to foreshortening of the left femoral neck. There are findings of a nondisplaced femoral neck fracture but need confirmation due to limited projections. No dislocation. No evidence of pelvic ring fracture or diastasis. IMPRESSION: Findings of nondisplaced left femoral neck fracture. Need confirmatory radiographic or CT imaging given rotation and foreshortening of the femoral neck on all of the provided images. Electronically Signed   By: Monte Fantasia M.D.   On: 11/21/2020 08:41        Scheduled Meds: . enoxaparin (LOVENOX) injection  30 mg Subcutaneous Once  . ipratropium  2 puff Inhalation Q4H  . nicotine  21 mg Transdermal Daily  . thiamine injection  100 mg Intravenous Daily  . venlafaxine XR  112.5 mg Oral Q breakfast   Continuous Infusions: . methocarbamol (ROBAXIN) IV       LOS: 0 days   Time spent= 20 mins   Dede Query, RN NP student   If 7PM-7AM, please contact  night-coverage  11/21/2020, 3:39 PM

## 2020-11-21 NOTE — ED Notes (Signed)
Attempted to call report x2, floor is assigning a different nurse to this pt.

## 2020-11-21 NOTE — Progress Notes (Signed)
Report received from ED 

## 2020-11-21 NOTE — ED Notes (Signed)
Report given to 6E. 

## 2020-11-21 NOTE — Progress Notes (Signed)
SLP Cancellation Note  Patient Details Name: Mariah Bruce MRN: [id number] DOB: 05-15-1946   Cancelled treatment:   pt npo and has hip fx, consult to surgery per notes, will continue efforts        Macario Golds 11/21/2020, 9:26 AM

## 2020-11-21 NOTE — Progress Notes (Incomplete)
PROGRESS NOTE    Mariah Bruce  UXL:[id number] DOB: 05/25/1946 DOA: 11/20/2020 PCP: Girtha Rm, NP-C   Brief Narrative:   Patient is a 75 y.o. female who presented to the ED with complaint of left hip pain secondary to a fall which occurred on 11/19/20.  Since the recent fall, she has been experiencing pain the left hip and was transported by EMS to [facility] ED on 11/20/20. Patient has a past medical history significant for CKD stage II, lung cancer, COPD, anxiety, depression, GERD, tobacco abuse.  Patient has been confused since admission and can not accurately recall the events or timing of the fall.  She is alert t to person, place, and event but not to time. The patient was recently hospitalized with a UTI and treated with IV antibiotics and discharged on 11/15/20 with an outpatient follow up at 7 days post discharge.   Patient has a 55 pack year history of tobacco use and is a current smoker. Patient was diagnosed with lung cancer in 2014. CT on recent admission showed multiple lung nodules. Patient has declined treatment for her lung cancer diagnosis.   ER Course:  Chest x-ray showing small left pleural effusion but otherwise nonacute CT pelvis showed comminuted impacted nondisplaced transcervical left femoral neck fracture.  Patient is currently not answering when questioned about if she wants to proceed with surgery to replace the left hip. She repeated asks, "where's my sister".  Orthopedics was consulted.   Assessment & Plan:   Active Problems:   Hematuria   Tobacco abuse   Lung cancer (HCC)   Acute metabolic encephalopathy   Protein-calorie malnutrition, severe   Closed left hip fracture (HCC)   Hypokalemia   Abnormal ECG   COPD (chronic obstructive pulmonary disease) (HCC)   Abnormal EKG    Nondisplaced left femoral neck fracture -Patient able to walk a flight of stairs or 100 ft at baseline, not currently an anticoagulants. -Type and screen  performed. -Vitamin D decreased at 11.54. -ECG shows right bundle branch block and nonspecific ST segment changes -Echo done 11/21/20, EF 65-70 %, no regional wall motion abnormalities noted. There is abnormal structure on the tricuspid annulus. -CT pelvis indicates left hip fracture, orthopedics consulted, with recommendation provided to plan on left hemiarthroplasty for 11/22/20. -Per orthopedics, patient is likely to be bed bound without procedure -Patient ambivalent about going through with procedure and both requests "where is her sister" and want to talk with sister before moving forward.   -NPO after midnight per orthopedics.  Attempts made by orthopedics to contact patient's sister to obtain consent. -One dose 30 mg Lovenox sq today for DVT prophylaxis. -Orthopedics discussed with patient's sister and she agrees to surgery.  Hypertensive urgency -B/P on admission 196/95 -Hydralazine 10 mg, as needed q4 hrs for B/P >160/100 -Continue to monitor  Tobacco abuse -55 pack year history and current smoker -Patient currently is not interested in quitting. -Recommend tobacco cessation counseling. -Continue nicotine patch.  Protein-calorie malnutrition, severe -Albumin 3.5 ->3.5->3.0 -Continue IV thiamine -Nutritional consult  Lung cancer -Patient declined further work-up at this time  Hematuria  -UTI, recent admission, unlikely infectious process at this time -Continue to complete course of prescribed po antibiotics  Acute metabolic encephalopathy  -Baseline mental status unknown as patient is unable to provide complete history and is disoriented to time -?Underlying dementia or other disease process -Ammonia level 15 -Attempt to contact family for further history. -CT head consistent with age related atrophy and chronic small vessel  ischemia. -Delirium precautions, manage pain, ensure adequate hydration.  Benzodiazepine use.   -Home use of Ativan, UDS positive for  benzodiazepines. -Continue Ativan IV as needed for agitation while n.p.o.  Hypokalemia -On admission, potassium 3.4, replete and recheck BMET. -Magnesium and phospate levels, 1.3 and 2.2 respectively.  Abnormal ECG -ECG shows right bundle branch block and nonspecific ST segment changes -Echo done 11/21/20, results pending. -Patient denies any chest pain or cardiac symptoms  COPD (chronic obstructive pulmonary disease), chronic/stable -PRN albuterol as needed and Atrovent scheduled -Chest x-ray showing no evidence of acute infarct, with small left pleural effusion.   Leukocytosis -in the setting of acute hip fracture -WBC's 16.5 ->11.9 -Liikely reactive, continue to monitor    DVT prophylaxis: SCD's Code Status: DNR Family Communication:  Attempts made by Orthopedics provider to contact patient's sister.  No family at bedside.  Status is: Inpatient    Dispo: The patient is from: Home              Anticipated d/c is to: Home              Anticipated d/c date is: 11/25/20              Patient currently is not medically stable for discharge.   There is no height or weight on file to calculate BMI.   Consultants:   Orthopedics  Procedures:   Left hemiarthroplasty for 11/22/20 if consent obtained.  Antimicrobials:   None    Subjective:  Patient was examined and assessed at bedside was alert to self, place, situation, but not time.  Patient is asking for her sister and has pain in her left hip.  Review of Systems Otherwise negative except as per HPI, including: General: Denies fever, chills, night sweats.  Appears chronically-ill, cachetic. Resp: Denies cough, wheezing, shortness of breath. Cardiac: Denies chest pain, palpitations, orthopnea, paroxysmal nocturnal dyspnea. GI: Denies abdominal pain, nausea, vomiting, diarrhea or constipation GU: Denies dysuria, frequency, hesitancy or incontinence MS: Reports left hip pain and tenderness, limited ROM in her lower  left hip and extremity. Neuro: Denies headache, neurologic deficits (focal weakness, numbness, tingling), abnormal gait Psych: Denies anxiety, depression, SI/HI/AVH Skin: Denies new rashes or lesions ID: Denies sick contacts, exotic exposures, travel  Examination:  General exam: Appears calm and comfortable, NAD. Respiratory system: Clear to auscultation. Respiratory effort normal. Cardiovascular system: S1 & S2 heard, RRR. No JVD, murmurs, rubs, gallops or clicks. No pedal edema. Gastrointestinal system: Abdomen is nondistended, soft and nontender. No organomegaly or masses felt. Normal bowel sounds heard. Central nervous system: Alert and oriented. No focal neurological deficits. Musculoskeletal: Left hip tender to palpation, limited active and passive ROM. Skin: No rashes, lesions or ulcers Psychiatry: Judgement and insight appear normal. Mood & affect appropriate.     Objective: Vitals:   11/21/20 1015 11/21/20 1033 11/21/20 1100 11/21/20 1230  BP: (!) 196/95 (!) 197/93 (!) 176/88 (!) 175/80  Pulse: 82 88 87 87  Resp: (!) 21 20 18 18   Temp:  98.6 F (37 C)    TempSrc:  Oral    SpO2: 100% 97% 98% 99%   No intake or output data in the 24 hours ending 11/21/20 1312 There were no vitals filed for this visit.   Data Reviewed:   CBC: Recent Labs  Lab 11/15/20 0505 11/20/20 2334 11/21/20 0444  WBC 8.4 15.6* 11.9*  NEUTROABS 5.3 13.7*  --   HGB 11.6* 13.5 11.8*  HCT 33.7* 41.1 35.9*  MCV 96.3 99.0  98.4  PLT 221 151 170*   Basic Metabolic Panel: Recent Labs  Lab 11/15/20 0505 11/20/20 2334 11/21/20 0145 11/21/20 0444  NA 138 136  --  136  K 3.5 3.4*  --  3.5  CL 102 100  --  102  CO2 26 26  --  26  GLUCOSE 82 138*  --  107*  BUN 13 26*  --  24*  CREATININE 0.85 0.93  --  0.70  CALCIUM 9.1 8.8*  --  8.3*  MG  --   --  1.3*  --   PHOS  --   --  2.2*  --    GFR: Estimated Creatinine Clearance: 44.2 mL/min (by C-G formula based on SCr of 0.7 mg/dL). Liver  Function Tests: Recent Labs  Lab 11/21/20 0145 11/21/20 0444  AST 27 23  ALT 29 25  ALKPHOS 103 88  BILITOT 1.0 0.9  PROT 6.1* 5.3*  ALBUMIN 3.5 3.0*   No results for input(s): LIPASE, AMYLASE in the last 168 hours. Recent Labs  Lab 11/21/20 0145  AMMONIA 15   Coagulation Profile: Recent Labs  Lab 11/21/20 0145  INR 1.1   Cardiac Enzymes: Recent Labs  Lab 11/21/20 0145  CKTOTAL 210   BNP (last 3 results) No results for input(s): PROBNP in the last 8760 hours. HbA1C: No results for input(s): HGBA1C in the last 72 hours. CBG: No results for input(s): GLUCAP in the last 168 hours. Lipid Profile: No results for input(s): CHOL, HDL, LDLCALC, TRIG, CHOLHDL, LDLDIRECT in the last 72 hours. Thyroid Function Tests: No results for input(s): TSH, T4TOTAL, FREET4, T3FREE, THYROIDAB in the last 72 hours. Anemia Panel: No results for input(s): VITAMINB12, FOLATE, FERRITIN, TIBC, IRON, RETICCTPCT in the last 72 hours. Sepsis Labs: No results for input(s): PROCALCITON, LATICACIDVEN in the last 168 hours.  Recent Results (from the past 240 hour(s))  Blood culture (routine x 2)     Status: None   Collection Time: 11/13/20  1:00 AM   Specimen: BLOOD LEFT FOREARM  Result Value Ref Range Status   Specimen Description   Final    BLOOD LEFT FOREARM Performed at [facility], [place] [address] [place], [place] [address]    Special Requests   Final    BOTTLES DRAWN AEROBIC AND ANAEROBIC BLOOD LEFT FOREARM Performed at [facility], [address] [place], [place] [address]    Culture   Final    NO GROWTH 5 DAYS Performed at [facility], [place] [address], [place], [place] [address]    Report Status 11/18/2020 FINAL  Final  Blood culture (routine x 2)     Status: None   Collection Time: 11/13/20  1:52 AM   Specimen: BLOOD LEFT HAND  Result Value Ref Range Status   Specimen Description   Final    BLOOD LEFT HAND Performed at [facility], [place] [address], [place], [place] [address]    Special Requests   Final    BOTTLES DRAWN AEROBIC AND ANAEROBIC LEFT HAND Performed at [facility], [address] [place], [place] [address]    Culture   Final    NO GROWTH 5 DAYS Performed at [facility], [place] [address] [place], [place] [address]    Report Status 11/18/2020 FINAL  Final  Resp Panel by RT-PCR (Flu A&B, Covid) Nasopharyngeal Swab     Status: None   Collection Time: 11/13/20  2:26 AM  Specimen: Nasopharyngeal Swab; Nasopharyngeal(NP) swabs in vial transport medium  Result Value Ref Range Status   SARS Coronavirus 2 by RT PCR NEGATIVE NEGATIVE Final    Comment: (NOTE) SARS-CoV-2 target nucleic acids are NOT DETECTED.  The SARS-CoV-2 RNA is generally detectable in upper respiratory specimens during the acute phase of infection. The lowest concentration of SARS-CoV-2 viral copies this assay can detect is 138 copies/mL. A negative result does not preclude SARS-Cov-2 infection and should not be used as the sole basis for treatment or other patient management decisions. A negative result may occur with  improper specimen collection/handling, submission of specimen other than nasopharyngeal swab, presence of viral mutation(s) within the areas targeted by this assay, and inadequate number of viral copies(<138 copies/mL). A negative result must be combined with clinical observations, patient history, and epidemiological information. The expected result is Negative.  Fact Sheet for Patients:  EntrepreneurPulse.com.au  Fact Sheet for Healthcare Providers:  IncredibleEmployment.be  This test is no t yet approved or cleared by the [place] FDA and  has been authorized for detection and/or diagnosis of SARS-CoV-2 by FDA under an Emergency Use Authorization (EUA). This EUA will remain  in effect (meaning this test can be used) for the duration of the COVID-19 declaration under  Section 564(b)(1) of the Act, 21 U.S.C.section 360bbb-3(b)(1), unless the authorization is terminated  or revoked sooner.       Influenza A by PCR NEGATIVE NEGATIVE Final   Influenza B by PCR NEGATIVE NEGATIVE Final    Comment: (NOTE) The Xpert Xpress SARS-CoV-2/FLU/RSV plus assay is intended as an aid in the diagnosis of influenza from Nasopharyngeal swab specimens and should not be used as a sole basis for treatment. Nasal washings and aspirates are unacceptable for Xpert Xpress SARS-CoV-2/FLU/RSV testing.  Fact Sheet for Patients: EntrepreneurPulse.com.au  Fact Sheet for Healthcare Providers: IncredibleEmployment.be  This test is not yet approved or cleared by the [place] FDA and has been authorized for detection and/or diagnosis of SARS-CoV-2 by FDA under an Emergency Use Authorization (EUA). This EUA will remain in effect (meaning this test can be used) for the duration of the COVID-19 declaration under Section 564(b)(1) of the Act, 21 U.S.C. section 360bbb-3(b)(1), unless the authorization is terminated or revoked.  Performed at [facility], [address] [place], [place] [address]   SARS CORONAVIRUS 2 (TAT 6-24 HRS) Nasopharyngeal Nasopharyngeal Swab     Status: None   Collection Time: 11/21/20 12:16 AM   Specimen: Nasopharyngeal Swab  Result Value Ref Range Status   SARS Coronavirus 2 NEGATIVE NEGATIVE Final    Comment: (NOTE) SARS-CoV-2 target nucleic acids are NOT DETECTED.  The SARS-CoV-2 RNA is generally detectable in upper and lower respiratory specimens during the acute phase of infection. Negative results do not preclude SARS-CoV-2 infection, do not rule out co-infections with other pathogens, and should not be used as the sole basis for treatment or other patient management decisions. Negative results must be combined with clinical observations, patient history, and epidemiological information. The  expected result is Negative.  Fact Sheet for Patients: SugarRoll.be  Fact Sheet for Healthcare Providers: https://www.woods-mathews.com/  This test is not yet approved or cleared by the [place] FDA and  has been authorized for detection and/or diagnosis of SARS-CoV-2 by FDA under an Emergency Use Authorization (EUA). This EUA will remain  in effect (meaning this test can be used) for the duration of the COVID-19 declaration under Se ction 564(b)(1) of the Act, 21 U.S.C. section  360bbb-3(b)(1), unless the authorization is terminated or revoked sooner.  Performed at [facility], [place] [address] [place], [place] [address]          Radiology Studies: CT HEAD WO CONTRAST  Result Date: 11/21/2020 CLINICAL DATA:  Patient fall at home EXAM: CT HEAD WITHOUT CONTRAST TECHNIQUE: Contiguous axial images were obtained from the base of the skull through the vertex without intravenous contrast. COMPARISON:  November 14, 2020 FINDINGS: Brain: No evidence of acute territorial infarction, hemorrhage, hydrocephalus,extra-axial collection or mass lesion/mass effect. There is dilatation the ventricles and sulci consistent with age-related atrophy. Low-attenuation changes in the deep white matter consistent with small vessel ischemia. Vascular: No hyperdense vessel or unexpected calcification. Skull: The skull is intact. No fracture or focal lesion identified. Sinuses/Orbits: The visualized paranasal sinuses and mastoid air cells are clear. The orbits and globes intact. Other: None IMPRESSION: No acute intracranial abnormality. Findings consistent with age related atrophy and chronic small vessel ischemia Electronically Signed   By: Prudencio Pair M.D.   On: 11/21/2020 02:44   CT PELVIS WO CONTRAST  Result Date: 11/21/2020 CLINICAL DATA:  Hip pain after fall EXAM: CT PELVIS WITHOUT CONTRAST TECHNIQUE: Multidetector CT imaging of the pelvis was performed  following the standard protocol without intravenous contrast. COMPARISON:  None. FINDINGS: Urinary Tract: The visualized distal ureters and bladder appear unremarkable. Bowel: No bowel wall thickening, distention or surrounding inflammation identified within the pelvis. Vascular/Lymphatic: No enlarged pelvic lymph nodes identified. Scattered aortic atherosclerosis is noted. Reproductive: The uterus and adnexa are unremarkable. Other: There is a soft tissue calcifications seen overlying the left inferior ischial tuberosity. Musculoskeletal: There is a comminuted slightly impacted nondisplaced fracture seen through the left transcervical femoral neck. The femoral head is still well seated within the acetabulum. A small hip joint effusion is seen. There is diffuse osteopenia. Again noted are degenerative changes in the lower lumbar spine. The sacroiliac joints appear to be intact. IMPRESSION: Comminuted impacted nondisplaced transcervical left femoral neck fracture. Electronically Signed   By: Prudencio Pair M.D.   On: 11/21/2020 00:13   DG CHEST PORT 1 VIEW  Result Date: 11/21/2020 CLINICAL DATA:  Hypoxia EXAM: PORTABLE CHEST 1 VIEW COMPARISON:  November 13, 2020 FINDINGS: The heart size and mediastinal contours are mildly enlarged. Aortic knob calcifications are seen. Again noted is increased interstitial markings with fibrosis and scarring at the right lung apex. A unchanged small left pleural effusion is seen. No acute osseous abnormality. IMPRESSION: No significant change with the right apical scarring and fibrosis Small left pleural effusion Electronically Signed   By: Prudencio Pair M.D.   On: 11/21/2020 00:16   DG Knee Left Port  Result Date: 11/21/2020 CLINICAL DATA:  Fall. EXAM: PORTABLE LEFT KNEE - 1-2 VIEW COMPARISON:  No prior. FINDINGS: Diffuse osteopenia. Mild degenerative change with chondrocalcinosis. No acute bony abnormality. No evidence of fracture dislocation. No prominent effusion. IMPRESSION:  Diffuse osteopenia. Mild degenerative change with chondrocalcinosis. No acute abnormality identified. Electronically Signed   By: Marcello Moores  Register   On: 11/21/2020 08:43   DG HIP PORT UNILAT WITH PELVIS 1V LEFT  Result Date: 11/21/2020 CLINICAL DATA:  Fall yesterday with hip injury EXAM: DG HIP (WITH OR WITHOUT PELVIS) 1V PORT LEFT COMPARISON:  None. FINDINGS: Limited study due to foreshortening of the left femoral neck. There are findings of a nondisplaced femoral neck fracture but need confirmation due to limited projections. No dislocation. No evidence of pelvic ring fracture or diastasis. IMPRESSION: Findings of nondisplaced  left femoral neck fracture. Need confirmatory radiographic or CT imaging given rotation and foreshortening of the femoral neck on all of the provided images. Electronically Signed   By: Monte Fantasia M.D.   On: 11/21/2020 08:41        Scheduled Meds: . ipratropium  2 puff Inhalation Q4H  . nicotine  21 mg Transdermal Daily  . thiamine injection  100 mg Intravenous Daily  . venlafaxine XR  112.5 mg Oral Q breakfast   Continuous Infusions: . methocarbamol (ROBAXIN) IV       LOS: 0 days   Time spent= 20 mins   Dede Query, RN NP student   If 7PM-7AM, please contact night-coverage  11/21/2020, 1:12 PM

## 2020-11-21 NOTE — Progress Notes (Signed)
  Echocardiogram 2D Echocardiogram has been performed.  Mariah Bruce 11/21/2020, 12:26 PM

## 2020-11-21 NOTE — Consult Note (Addendum)
ORTHOPAEDIC CONSULTATION  REQUESTING PHYSICIAN: Oswald Hillock, MD  PCP:  Girtha Rm, NP-C  Chief Complaint: Left hip pain  HPI: Mariah Bruce is a 75 y.o. female who complains of left hip pain secondary to a fall. The patient has PMH significant of CKD stage II, lung cancer, COPD, anxiety, depression, GERD, tobacco abuse The patient fell on January 18 and has been experiencing pain the left hip since the fall. EMS transported her to the ED on January 19.  The patient has been confused since arrival and is unable to say exactly how or when the fall occurred. She is alert to person, place, and event but not to time.  The patient was recently discharged on 1/14 with a diagnosis of UTI treated with IV antibiotics. Plan was for outpatient follow up 7 days post discharge.  Patient has known lung cancer diagnosis since 2014 and declined further treatment at that time. CT was notable for multiple lung nodules during recent admission. Patient is currently not answering when asked if she wants to proceed with surgery to replace the left hip. She continues to ask, "where's my sister". It was explained to the patient that she will likely be bedbound without intervention. Patient is DNR and do not intubate.    Past Medical History:  Diagnosis Date  . Anxiety   . Chronic pain   . COPD (chronic obstructive pulmonary disease) ([place])   . Depression   . GERD (gastroesophageal reflux disease)   . Lung cancer [facility])    Past Surgical History:  Procedure Laterality Date  . BACK SURGERY    . HERNIA REPAIR    . TONSILLECTOMY     Social History   Socioeconomic History  . Marital status: Married    Spouse name: Not on file  . Number of children: Not on file  . Years of education: Not on file  . Highest education level: Not on file  Occupational History  . Not on file  Tobacco Use  . Smoking status: Current Every Day Smoker    Packs/day: 1.00    Years: 55.00    Pack years: 55.00     Types: Cigarettes  . Smokeless tobacco: Never Used  Substance and Sexual Activity  . Alcohol use: Yes    Comment: 2 beers a night  . Drug use: Never  . Sexual activity: Not on file  Other Topics Concern  . Not on file  Social History Narrative  . Not on file   Social Determinants of Health   Financial Resource Strain: Not on file  Food Insecurity: Not on file  Transportation Needs: Not on file  Physical Activity: Not on file  Stress: Not on file  Social Connections: Not on file   Family History  Problem Relation Age of Onset  . Hypertension Other    Allergies  Allergen Reactions  . Penicillin G Rash   Prior to Admission medications   Medication Sig Start Date End Date Taking? Authorizing Provider  amitriptyline (ELAVIL) 10 MG tablet Take 1 tablet (10 mg total) by mouth at bedtime. 11/15/20 12/15/20 Yes Arrien, Jimmy Picket, MD  citalopram (CELEXA) 20 MG tablet Take 1.5 tablets (30 mg total) by mouth daily. Taking 1 & 1/2 tab daily = 30 mg 11/15/20 12/15/20 Yes Arrien, Jimmy Picket, MD  famotidine (PEPCID) 40 MG tablet Take 1 tablet (40 mg total) by mouth daily for 30 doses. 11/15/20 12/15/20 Yes Arrien, Jimmy Picket, MD  gabapentin (NEURONTIN) 300 MG capsule Take  1-2 capsules (300-600 mg total) by mouth See admin instructions. Taking 300 mg in the Am and 600 mg at bedtime 11/15/20  Yes Arrien, Jimmy Picket, MD  ibuprofen (ADVIL) 200 MG tablet Take 400 mg by mouth every 6 (six) hours as needed for mild pain.   Yes [provider]  LORazepam (ATIVAN) 0.5 MG tablet Take 0.5-1 mg by mouth every 4 (four) hours as needed for anxiety (Shortness of breath).   Yes [provider]  oxyCODONE-acetaminophen (PERCOCET/ROXICET) 5-325 MG tablet Take 2 tablets by mouth every 4 (four) hours as needed for moderate pain.   Yes [provider]  senna-docusate (SENOKOT-S) 8.6-50 MG tablet Take 2 tablets by mouth 2 (two) times daily.   Yes [provider]   venlafaxine XR (EFFEXOR-XR) 37.5 MG 24 hr capsule Take 1 capsule (37.5 mg total) by mouth daily. Taking with 75 mg daily = 112.5 mg 11/15/20 12/15/20 Yes Arrien, Jimmy Picket, MD  venlafaxine XR (EFFEXOR-XR) 75 MG 24 hr capsule Take 1 capsule (75 mg total) by mouth daily with breakfast. Taking with 37.5 mg daily = 112.5 mg 11/15/20 12/15/20 Yes Arrien, Jimmy Picket, MD   CT HEAD WO CONTRAST  Result Date: 11/21/2020 CLINICAL DATA:  Patient fall at home EXAM: CT HEAD WITHOUT CONTRAST TECHNIQUE: Contiguous axial images were obtained from the base of the skull through the vertex without intravenous contrast. COMPARISON:  November 14, 2020 FINDINGS: Brain: No evidence of acute territorial infarction, hemorrhage, hydrocephalus,extra-axial collection or mass lesion/mass effect. There is dilatation the ventricles and sulci consistent with age-related atrophy. Low-attenuation changes in the deep white matter consistent with small vessel ischemia. Vascular: No hyperdense vessel or unexpected calcification. Skull: The skull is intact. No fracture or focal lesion identified. Sinuses/Orbits: The visualized paranasal sinuses and mastoid air cells are clear. The orbits and globes intact. Other: None IMPRESSION: No acute intracranial abnormality. Findings consistent with age related atrophy and chronic small vessel ischemia Electronically Signed   By: Prudencio Pair M.D.   On: 11/21/2020 02:44   CT PELVIS WO CONTRAST  Result Date: 11/21/2020 CLINICAL DATA:  Hip pain after fall EXAM: CT PELVIS WITHOUT CONTRAST TECHNIQUE: Multidetector CT imaging of the pelvis was performed following the standard protocol without intravenous contrast. COMPARISON:  None. FINDINGS: Urinary Tract: The visualized distal ureters and bladder appear unremarkable. Bowel: No bowel wall thickening, distention or surrounding inflammation identified within the pelvis. Vascular/Lymphatic: No enlarged pelvic lymph nodes identified. Scattered aortic  atherosclerosis is noted. Reproductive: The uterus and adnexa are unremarkable. Other: There is a soft tissue calcifications seen overlying the left inferior ischial tuberosity. Musculoskeletal: There is a comminuted slightly impacted nondisplaced fracture seen through the left transcervical femoral neck. The femoral head is still well seated within the acetabulum. A small hip joint effusion is seen. There is diffuse osteopenia. Again noted are degenerative changes in the lower lumbar spine. The sacroiliac joints appear to be intact. IMPRESSION: Comminuted impacted nondisplaced transcervical left femoral neck fracture. Electronically Signed   By: Prudencio Pair M.D.   On: 11/21/2020 00:13   DG CHEST PORT 1 VIEW  Result Date: 11/21/2020 CLINICAL DATA:  Hypoxia EXAM: PORTABLE CHEST 1 VIEW COMPARISON:  November 13, 2020 FINDINGS: The heart size and mediastinal contours are mildly enlarged. Aortic knob calcifications are seen. Again noted is increased interstitial markings with fibrosis and scarring at the right lung apex. A unchanged small left pleural effusion is seen. No acute osseous abnormality. IMPRESSION: No significant change with the right apical scarring and  fibrosis Small left pleural effusion Electronically Signed   By: Prudencio Pair M.D.   On: 11/21/2020 00:16   DG Knee Left Port  Result Date: 11/21/2020 CLINICAL DATA:  Fall. EXAM: PORTABLE LEFT KNEE - 1-2 VIEW COMPARISON:  No prior. FINDINGS: Diffuse osteopenia. Mild degenerative change with chondrocalcinosis. No acute bony abnormality. No evidence of fracture dislocation. No prominent effusion. IMPRESSION: Diffuse osteopenia. Mild degenerative change with chondrocalcinosis. No acute abnormality identified. Electronically Signed   By: Marcello Moores  Register   On: 11/21/2020 08:43   DG HIP PORT UNILAT WITH PELVIS 1V LEFT  Result Date: 11/21/2020 CLINICAL DATA:  Fall yesterday with hip injury EXAM: DG HIP (WITH OR WITHOUT PELVIS) 1V PORT LEFT COMPARISON:   None. FINDINGS: Limited study due to foreshortening of the left femoral neck. There are findings of a nondisplaced femoral neck fracture but need confirmation due to limited projections. No dislocation. No evidence of pelvic ring fracture or diastasis. IMPRESSION: Findings of nondisplaced left femoral neck fracture. Need confirmatory radiographic or CT imaging given rotation and foreshortening of the femoral neck on all of the provided images. Electronically Signed   By: Monte Fantasia M.D.   On: 11/21/2020 08:41    Positive ROS: All other systems have been reviewed and were otherwise negative with the exception of those mentioned in the HPI and as above.  Physical Exam: General: Alert, no acute distress Cardiovascular: No pedal edema Respiratory: No cyanosis, no use of accessory musculature GI: No organomegaly, abdomen is soft and non-tender Skin: No lesions in the area of chief complaint Neurologic: Sensation intact distally Psychiatric: AMS Lymphatic: No axillary or cervical lymphadenopathy  MUSCULOSKELETAL: Left hip tender to palpation, pain with passive ROM, plantar and dorsiflexion intact in the LLE. Pedal pulse 2+. Unable to assess sensation due to AMS.  Assessment: Unstable left femoral neck fracture COPD CKD Lung cancer  Plan: Patient has subacute unstable left femoral neck fracture with retroversion of the femoral head as seen on the CT, which indicates poor prognosis for percutaneous fixation, especially in a chronically ill smoker. Nonoperative treatment would surely lead to her demise due to immobility. Even with surgery, she has at least a 30% 90 day mortality rate. Nonetheless, patient will benefit from arthroplasty procedure for pain control and to allow for mobilization out of bed, however she currently will not agree to surgery. With AMS, attempts have been made to contact her sister, which were unsuccessful.  Further attempts will be made to contact patient's  sister.    Dorothyann Peng, PA [phone]    11/21/2020 10:09 AM    ADDENDUM: Spoke with the patient's sister over the phone. We discussed her injury and treatment options. Patient's sister agrees with surgical intervention. We reviewed the R/B/A and discussed hip fracture mortality rate. She wishes to proceed. Plan for surgery tomorrow am.  Bertram Savin, MD  11/21/20 11:48 PM

## 2020-11-21 NOTE — H&P (View-Only) (Signed)
ORTHOPAEDIC CONSULTATION  REQUESTING PHYSICIAN: Oswald Hillock, MD  PCP:  Girtha Rm, NP-C  Chief Complaint: Left hip pain  HPI: Mariah Bruce is a 75 y.o. female who complains of left hip pain secondary to a fall. The patient has PMH significant of CKD stage II, lung cancer, COPD, anxiety, depression, GERD, tobacco abuse The patient fell on January 18 and has been experiencing pain the left hip since the fall. EMS transported her to the ED on January 19.  The patient has been confused since arrival and is unable to say exactly how or when the fall occurred. She is alert to person, place, and event but not to time.  The patient was recently discharged on 1/14 with a diagnosis of UTI treated with IV antibiotics. Plan was for outpatient follow up 7 days post discharge.  Patient has known lung cancer diagnosis since 2014 and declined further treatment at that time. CT was notable for multiple lung nodules during recent admission. Patient is currently not answering when asked if she wants to proceed with surgery to replace the left hip. She continues to ask, "where's my sister". It was explained to the patient that she will likely be bedbound without intervention. Patient is DNR and do not intubate.    Past Medical History:  Diagnosis Date   Anxiety    Chronic pain    COPD (chronic obstructive pulmonary disease) ([facility])    Depression    GERD (gastroesophageal reflux disease)    Lung cancer ([facility])    Past Surgical History:  Procedure Laterality Date   BACK SURGERY     HERNIA REPAIR     TONSILLECTOMY     Social History   Socioeconomic History   Marital status: Married    Spouse name: Not on file   Number of children: Not on file   Years of education: Not on file   Highest education level: Not on file  Occupational History   Not on file  Tobacco Use   Smoking status: Current Every Day Smoker    Packs/day: 1.00    Years: 55.00    Pack years: 55.00     Types: Cigarettes   Smokeless tobacco: Never Used  Substance and Sexual Activity   Alcohol use: Yes    Comment: 2 beers a night   Drug use: Never   Sexual activity: Not on file  Other Topics Concern   Not on file  Social History Narrative   Not on file   Social Determinants of Health   Financial Resource Strain: Not on file  Food Insecurity: Not on file  Transportation Needs: Not on file  Physical Activity: Not on file  Stress: Not on file  Social Connections: Not on file   Family History  Problem Relation Age of Onset   Hypertension Other    Allergies  Allergen Reactions   Penicillin G Rash   Prior to Admission medications   Medication Sig Start Date End Date Taking? Authorizing Provider  amitriptyline (ELAVIL) 10 MG tablet Take 1 tablet (10 mg total) by mouth at bedtime. 11/15/20 12/15/20 Yes Arrien, Jimmy Picket, MD  citalopram (CELEXA) 20 MG tablet Take 1.5 tablets (30 mg total) by mouth daily. Taking 1 & 1/2 tab daily = 30 mg 11/15/20 12/15/20 Yes Arrien, Jimmy Picket, MD  famotidine (PEPCID) 40 MG tablet Take 1 tablet (40 mg total) by mouth daily for 30 doses. 11/15/20 12/15/20 Yes Arrien, Jimmy Picket, MD  gabapentin (NEURONTIN) 300 MG capsule Take  1-2 capsules (300-600 mg total) by mouth See admin instructions. Taking 300 mg in the Am and 600 mg at bedtime 11/15/20  Yes Arrien, Jimmy Picket, MD  ibuprofen (ADVIL) 200 MG tablet Take 400 mg by mouth every 6 (six) hours as needed for mild pain.   Yes [provider]  LORazepam (ATIVAN) 0.5 MG tablet Take 0.5-1 mg by mouth every 4 (four) hours as needed for anxiety (Shortness of breath).   Yes [provider]  oxyCODONE-acetaminophen (PERCOCET/ROXICET) 5-325 MG tablet Take 2 tablets by mouth every 4 (four) hours as needed for moderate pain.   Yes [provider]  senna-docusate (SENOKOT-S) 8.6-50 MG tablet Take 2 tablets by mouth 2 (two) times daily.   Yes [provider]   venlafaxine XR (EFFEXOR-XR) 37.5 MG 24 hr capsule Take 1 capsule (37.5 mg total) by mouth daily. Taking with 75 mg daily = 112.5 mg 11/15/20 12/15/20 Yes Arrien, Jimmy Picket, MD  venlafaxine XR (EFFEXOR-XR) 75 MG 24 hr capsule Take 1 capsule (75 mg total) by mouth daily with breakfast. Taking with 37.5 mg daily = 112.5 mg 11/15/20 12/15/20 Yes Arrien, Jimmy Picket, MD   CT HEAD WO CONTRAST  Result Date: 11/21/2020 CLINICAL DATA:  Patient fall at home EXAM: CT HEAD WITHOUT CONTRAST TECHNIQUE: Contiguous axial images were obtained from the base of the skull through the vertex without intravenous contrast. COMPARISON:  November 14, 2020 FINDINGS: Brain: No evidence of acute territorial infarction, hemorrhage, hydrocephalus,extra-axial collection or mass lesion/mass effect. There is dilatation the ventricles and sulci consistent with age-related atrophy. Low-attenuation changes in the deep white matter consistent with small vessel ischemia. Vascular: No hyperdense vessel or unexpected calcification. Skull: The skull is intact. No fracture or focal lesion identified. Sinuses/Orbits: The visualized paranasal sinuses and mastoid air cells are clear. The orbits and globes intact. Other: None IMPRESSION: No acute intracranial abnormality. Findings consistent with age related atrophy and chronic small vessel ischemia Electronically Signed   By: Prudencio Pair M.D.   On: 11/21/2020 02:44   CT PELVIS WO CONTRAST  Result Date: 11/21/2020 CLINICAL DATA:  Hip pain after fall EXAM: CT PELVIS WITHOUT CONTRAST TECHNIQUE: Multidetector CT imaging of the pelvis was performed following the standard protocol without intravenous contrast. COMPARISON:  None. FINDINGS: Urinary Tract: The visualized distal ureters and bladder appear unremarkable. Bowel: No bowel wall thickening, distention or surrounding inflammation identified within the pelvis. Vascular/Lymphatic: No enlarged pelvic lymph nodes identified. Scattered aortic  atherosclerosis is noted. Reproductive: The uterus and adnexa are unremarkable. Other: There is a soft tissue calcifications seen overlying the left inferior ischial tuberosity. Musculoskeletal: There is a comminuted slightly impacted nondisplaced fracture seen through the left transcervical femoral neck. The femoral head is still well seated within the acetabulum. A small hip joint effusion is seen. There is diffuse osteopenia. Again noted are degenerative changes in the lower lumbar spine. The sacroiliac joints appear to be intact. IMPRESSION: Comminuted impacted nondisplaced transcervical left femoral neck fracture. Electronically Signed   By: Prudencio Pair M.D.   On: 11/21/2020 00:13   DG CHEST PORT 1 VIEW  Result Date: 11/21/2020 CLINICAL DATA:  Hypoxia EXAM: PORTABLE CHEST 1 VIEW COMPARISON:  November 13, 2020 FINDINGS: The heart size and mediastinal contours are mildly enlarged. Aortic knob calcifications are seen. Again noted is increased interstitial markings with fibrosis and scarring at the right lung apex. A unchanged small left pleural effusion is seen. No acute osseous abnormality. IMPRESSION: No significant change with the right apical scarring and  fibrosis Small left pleural effusion Electronically Signed   By: Prudencio Pair M.D.   On: 11/21/2020 00:16   DG Knee Left Port  Result Date: 11/21/2020 CLINICAL DATA:  Fall. EXAM: PORTABLE LEFT KNEE - 1-2 VIEW COMPARISON:  No prior. FINDINGS: Diffuse osteopenia. Mild degenerative change with chondrocalcinosis. No acute bony abnormality. No evidence of fracture dislocation. No prominent effusion. IMPRESSION: Diffuse osteopenia. Mild degenerative change with chondrocalcinosis. No acute abnormality identified. Electronically Signed   By: Marcello Moores  Register   On: 11/21/2020 08:43   DG HIP PORT UNILAT WITH PELVIS 1V LEFT  Result Date: 11/21/2020 CLINICAL DATA:  Fall yesterday with hip injury EXAM: DG HIP (WITH OR WITHOUT PELVIS) 1V PORT LEFT COMPARISON:   None. FINDINGS: Limited study due to foreshortening of the left femoral neck. There are findings of a nondisplaced femoral neck fracture but need confirmation due to limited projections. No dislocation. No evidence of pelvic ring fracture or diastasis. IMPRESSION: Findings of nondisplaced left femoral neck fracture. Need confirmatory radiographic or CT imaging given rotation and foreshortening of the femoral neck on all of the provided images. Electronically Signed   By: Monte Fantasia M.D.   On: 11/21/2020 08:41    Positive ROS: All other systems have been reviewed and were otherwise negative with the exception of those mentioned in the HPI and as above.  Physical Exam: General: Alert, no acute distress Cardiovascular: No pedal edema Respiratory: No cyanosis, no use of accessory musculature GI: No organomegaly, abdomen is soft and non-tender Skin: No lesions in the area of chief complaint Neurologic: Sensation intact distally Psychiatric: AMS Lymphatic: No axillary or cervical lymphadenopathy  MUSCULOSKELETAL: Left hip tender to palpation, pain with passive ROM, plantar and dorsiflexion intact in the LLE. Pedal pulse 2+. Unable to assess sensation due to AMS.  Assessment: Unstable left femoral neck fracture COPD CKD Lung cancer  Plan: Patient has subacute unstable left femoral neck fracture with retroversion of the femoral head as seen on the CT, which indicates poor prognosis for percutaneous fixation, especially in a chronically ill smoker. Nonoperative treatment would surely lead to her demise due to immobility. Even with surgery, she has at least a 30% 90 day mortality rate. Nonetheless, patient will benefit from arthroplasty procedure for pain control and to allow for mobilization out of bed, however she currently will not agree to surgery. With AMS, attempts have been made to contact her sister, which were unsuccessful.  Further attempts will be made to contact patient's  sister.    Dorothyann Peng, PA [phone]    11/21/2020 10:09 AM    ADDENDUM: Spoke with the patient's sister over the phone. We discussed her injury and treatment options. Patient's sister agrees with surgical intervention. We reviewed the R/B/A and discussed hip fracture mortality rate. She wishes to proceed. Plan for surgery tomorrow am.  Bertram Savin, MD  11/21/20 11:48 PM

## 2020-11-22 ENCOUNTER — Encounter (HOSPITAL_COMMUNITY): Payer: Self-pay | Admitting: Internal Medicine

## 2020-11-22 ENCOUNTER — Inpatient Hospital Stay (HOSPITAL_COMMUNITY): Payer: Medicare Other | Admitting: Certified Registered Nurse Anesthetist

## 2020-11-22 ENCOUNTER — Inpatient Hospital Stay (HOSPITAL_COMMUNITY): Admission: RE | Admit: 2020-11-22 | Payer: Medicare Other | Source: Ambulatory Visit | Admitting: Orthopedic Surgery

## 2020-11-22 ENCOUNTER — Inpatient Hospital Stay (HOSPITAL_COMMUNITY): Payer: Medicare Other

## 2020-11-22 ENCOUNTER — Encounter (HOSPITAL_COMMUNITY)
Admission: EM | Disposition: A | Discharge status: Skilled Nursing Facility | Payer: Self-pay | Source: Home / Self Care | Attending: Family Medicine

## 2020-11-22 HISTORY — PX: ANTERIOR APPROACH HEMI HIP ARTHROPLASTY: SHX6690

## 2020-11-22 LAB — COMPREHENSIVE METABOLIC PANEL
ALT: 23 U/L (ref 0–44)
AST: 21 U/L (ref 15–41)
Albumin: 3.3 g/dL — ABNORMAL LOW (ref 3.5–5.0)
Alkaline Phosphatase: 82 U/L (ref 38–126)
Anion gap: 10 (ref 5–15)
BUN: 14 mg/dL (ref 8–23)
CO2: 27 mmol/L (ref 22–32)
Calcium: 8.3 mg/dL — ABNORMAL LOW (ref 8.9–10.3)
Chloride: 97 mmol/L — ABNORMAL LOW (ref 98–111)
Creatinine, Ser: 0.74 mg/dL (ref 0.44–1.00)
GFR, Estimated: >60 mL/min (ref >60)
Glucose, Bld: 86 mg/dL (ref 70–99)
Potassium: 3 mmol/L — ABNORMAL LOW (ref 3.5–5.1)
Sodium: 134 mmol/L — ABNORMAL LOW (ref 135–145)
Total Bilirubin: 0.9 mg/dL (ref 0.3–1.2)
Total Protein: 5.8 g/dL — ABNORMAL LOW (ref 6.5–8.1)

## 2020-11-22 LAB — URINE CULTURE: Culture: NO GROWTH

## 2020-11-22 LAB — CBC
HCT: 36.1 % (ref 36.0–46.0)
Hemoglobin: 12 g/dL (ref 12.0–15.0)
MCH: 32.7 pg (ref 26.0–34.0)
MCHC: 33.2 g/dL (ref 30.0–36.0)
MCV: 98.4 fL (ref 80.0–100.0)
Platelets: 124 10*3/uL — ABNORMAL LOW (ref 150–400)
RBC: 3.67 MIL/uL — ABNORMAL LOW (ref 3.87–5.11)
RDW: 14 % (ref 11.5–15.5)
WBC: 8.4 10*3/uL (ref 4.0–10.5)
nRBC: 0 % (ref 0.0–0.2)

## 2020-11-22 LAB — SURGICAL PCR SCREEN
MRSA, PCR: NEGATIVE
Staphylococcus aureus: NEGATIVE

## 2020-11-22 SURGERY — HEMIARTHROPLASTY, HIP, DIRECT ANTERIOR APPROACH, FOR FRACTURE
Anesthesia: Spinal | Laterality: Left

## 2020-11-22 MED ORDER — FENTANYL CITRATE (PF) 100 MCG/2ML IJ SOLN: 25.0000 ug | INTRAMUSCULAR | Status: DC | PRN

## 2020-11-22 MED ORDER — POVIDONE-IODINE 10 % EX SWAB
2.0000 "application " | Freq: Once | CUTANEOUS | Status: AC
  Administered 2020-11-22: 2 via TOPICAL

## 2020-11-22 MED ORDER — HYDROCODONE-ACETAMINOPHEN 7.5-325 MG PO TABS
1.0000 | ORAL_TABLET | ORAL | Status: DC | PRN
Start: 2020-11-22 — End: 2020-11-27

## 2020-11-22 MED ORDER — BENEPROTEIN PO POWD
1.0000 | Freq: Three times a day (TID) | ORAL | Status: DC
  Filled 2020-11-22: qty 227

## 2020-11-22 MED ORDER — BUPIVACAINE-EPINEPHRINE 0.5% -1:200000 IJ SOLN
INTRAMUSCULAR | Status: AC
  Filled 2020-11-22: qty 1

## 2020-11-22 MED ORDER — BUPIVACAINE IN DEXTROSE 0.75-8.25 % IT SOLN
INTRATHECAL | Status: DC | PRN
  Administered 2020-11-22: 1.6 mL via INTRATHECAL

## 2020-11-22 MED ORDER — FENTANYL CITRATE (PF) 100 MCG/2ML IJ SOLN
INTRAMUSCULAR | Status: DC | PRN
  Administered 2020-11-22 (×2): 25 ug via INTRAVENOUS

## 2020-11-22 MED ORDER — PHENYLEPHRINE 40 MCG/ML (10ML) SYRINGE FOR IV PUSH (FOR BLOOD PRESSURE SUPPORT)
PREFILLED_SYRINGE | INTRAVENOUS | Status: AC
  Filled 2020-11-22: qty 10

## 2020-11-22 MED ORDER — POVIDONE-IODINE 10 % EX SWAB: 2.0000 "application " | Freq: Once | CUTANEOUS | Status: DC

## 2020-11-22 MED ORDER — KETOROLAC TROMETHAMINE 30 MG/ML IJ SOLN
INTRAMUSCULAR | Status: AC
  Filled 2020-11-22: qty 1

## 2020-11-22 MED ORDER — BUPIVACAINE-EPINEPHRINE (PF) 0.5% -1:200000 IJ SOLN
INTRAMUSCULAR | Status: DC | PRN
  Administered 2020-11-22: 30 mL

## 2020-11-22 MED ORDER — FENTANYL CITRATE (PF) 100 MCG/2ML IJ SOLN
INTRAMUSCULAR | Status: AC
  Filled 2020-11-22: qty 2

## 2020-11-22 MED ORDER — PROPOFOL 1000 MG/100ML IV EMUL
INTRAVENOUS | Status: AC
  Filled 2020-11-22: qty 100

## 2020-11-22 MED ORDER — MORPHINE SULFATE (PF) 2 MG/ML IV SOLN: 0.5000 mg | INTRAVENOUS | Status: DC | PRN

## 2020-11-22 MED ORDER — DEXAMETHASONE SODIUM PHOSPHATE 10 MG/ML IJ SOLN
INTRAMUSCULAR | Status: DC | PRN
  Administered 2020-11-22: 5 mg via INTRAVENOUS

## 2020-11-22 MED ORDER — ONDANSETRON HCL 4 MG/2ML IJ SOLN: 4.0000 mg | Freq: Four times a day (QID) | INTRAMUSCULAR | Status: DC | PRN

## 2020-11-22 MED ORDER — SODIUM CHLORIDE (PF) 0.9 % IJ SOLN
INTRAMUSCULAR | Status: DC | PRN
  Administered 2020-11-22: 30 mL

## 2020-11-22 MED ORDER — HYDROCODONE-ACETAMINOPHEN 5-325 MG PO TABS
1.0000 | ORAL_TABLET | ORAL | Status: DC | PRN
  Administered 2020-11-22 – 2020-11-24 (×3): 1 via ORAL
  Filled 2020-11-22 (×4): qty 1

## 2020-11-22 MED ORDER — TRANEXAMIC ACID-NACL 1000-0.7 MG/100ML-% IV SOLN
1000.0000 mg | INTRAVENOUS | Status: AC
  Administered 2020-11-22: 1000 mg via INTRAVENOUS
  Filled 2020-11-22: qty 100

## 2020-11-22 MED ORDER — PROPOFOL 10 MG/ML IV BOLUS
INTRAVENOUS | Status: DC | PRN
  Administered 2020-11-22: 20 mg via INTRAVENOUS

## 2020-11-22 MED ORDER — ACETAMINOPHEN 325 MG PO TABS
325.0000 mg | ORAL_TABLET | Freq: Four times a day (QID) | ORAL | Status: DC | PRN
  Administered 2020-11-23 – 2020-11-27 (×4): 650 mg via ORAL
  Filled 2020-11-22 (×5): qty 2

## 2020-11-22 MED ORDER — LACTATED RINGERS IV SOLN: INTRAVENOUS | Status: DC

## 2020-11-22 MED ORDER — CHLORHEXIDINE GLUCONATE CLOTH 2 % EX PADS: 6.0000 | MEDICATED_PAD | Freq: Every day | CUTANEOUS | Status: DC

## 2020-11-22 MED ORDER — KETAMINE HCL 10 MG/ML IJ SOLN
INTRAMUSCULAR | Status: AC
  Filled 2020-11-22: qty 1

## 2020-11-22 MED ORDER — SODIUM CHLORIDE (PF) 0.9 % IJ SOLN
INTRAMUSCULAR | Status: AC
  Filled 2020-11-22: qty 30

## 2020-11-22 MED ORDER — SODIUM CHLORIDE 0.9 % IR SOLN
Status: DC | PRN
  Administered 2020-11-22: 3000 mL

## 2020-11-22 MED ORDER — ENSURE PRE-SURGERY PO LIQD
296.0000 mL | Freq: Once | ORAL | Status: DC
  Filled 2020-11-22: qty 296

## 2020-11-22 MED ORDER — PROPOFOL 500 MG/50ML IV EMUL
INTRAVENOUS | Status: DC | PRN
  Administered 2020-11-22: 50 ug/kg/min via INTRAVENOUS

## 2020-11-22 MED ORDER — CEFAZOLIN SODIUM-DEXTROSE 2-4 GM/100ML-% IV SOLN
2.0000 g | Freq: Four times a day (QID) | INTRAVENOUS | Status: AC
  Administered 2020-11-22 (×2): 2 g via INTRAVENOUS
  Filled 2020-11-22 (×2): qty 100

## 2020-11-22 MED ORDER — SENNA 8.6 MG PO TABS
1.0000 | ORAL_TABLET | Freq: Two times a day (BID) | ORAL | Status: DC
  Administered 2020-11-22 – 2020-11-27 (×10): 8.6 mg via ORAL
  Filled 2020-11-22 (×10): qty 1

## 2020-11-22 MED ORDER — PHENYLEPHRINE HCL-NACL 10-0.9 MG/250ML-% IV SOLN
INTRAVENOUS | Status: DC | PRN
  Administered 2020-11-22: 50 ug/min via INTRAVENOUS

## 2020-11-22 MED ORDER — METHOCARBAMOL 500 MG PO TABS
500.0000 mg | ORAL_TABLET | Freq: Four times a day (QID) | ORAL | Status: DC | PRN
  Administered 2020-11-22: 500 mg via ORAL
  Filled 2020-11-22: qty 1

## 2020-11-22 MED ORDER — IRRISEPT - 450ML BOTTLE WITH 0.05% CHG IN STERILE WATER, USP 99.95% OPTIME
TOPICAL | Status: DC | PRN
  Administered 2020-11-22: 450 mL

## 2020-11-22 MED ORDER — APIXABAN 2.5 MG PO TABS
2.5000 mg | ORAL_TABLET | Freq: Two times a day (BID) | ORAL | Status: DC
  Administered 2020-11-23 – 2020-11-27 (×9): 2.5 mg via ORAL
  Filled 2020-11-22 (×9): qty 1

## 2020-11-22 MED ORDER — BUPIVACAINE-EPINEPHRINE (PF) 0.5% -1:200000 IJ SOLN
INTRAMUSCULAR | Status: AC
  Filled 2020-11-22: qty 30

## 2020-11-22 MED ORDER — PHENOL 1.4 % MT LIQD: 1.0000 | OROMUCOSAL | Status: DC | PRN

## 2020-11-22 MED ORDER — ONDANSETRON HCL 4 MG PO TABS: 4.0000 mg | ORAL_TABLET | Freq: Four times a day (QID) | ORAL | Status: DC | PRN

## 2020-11-22 MED ORDER — STERILE WATER FOR IRRIGATION IR SOLN
Status: DC | PRN
  Administered 2020-11-22: 2000 mL

## 2020-11-22 MED ORDER — CHLORHEXIDINE GLUCONATE 4 % EX LIQD
60.0000 mL | Freq: Once | CUTANEOUS | Status: AC
  Administered 2020-11-22: 4 via TOPICAL
  Filled 2020-11-22: qty 60

## 2020-11-22 MED ORDER — CHLORHEXIDINE GLUCONATE 0.12 % MT SOLN
15.0000 mL | Freq: Once | OROMUCOSAL | Status: AC
  Administered 2020-11-22: 15 mL via OROMUCOSAL

## 2020-11-22 MED ORDER — MENTHOL 3 MG MT LOZG: 1.0000 | LOZENGE | OROMUCOSAL | Status: DC | PRN

## 2020-11-22 MED ORDER — DOCUSATE SODIUM 100 MG PO CAPS
100.0000 mg | ORAL_CAPSULE | Freq: Two times a day (BID) | ORAL | Status: DC
  Administered 2020-11-22 – 2020-11-27 (×10): 100 mg via ORAL
  Filled 2020-11-22 (×10): qty 1

## 2020-11-22 MED ORDER — ONDANSETRON HCL 4 MG/2ML IJ SOLN
INTRAMUSCULAR | Status: DC | PRN
  Administered 2020-11-22: 4 mg via INTRAVENOUS

## 2020-11-22 MED ORDER — METHOCARBAMOL 500 MG IVPB - SIMPLE MED
500.0000 mg | Freq: Four times a day (QID) | INTRAVENOUS | Status: DC | PRN
  Filled 2020-11-22: qty 50

## 2020-11-22 MED ORDER — KETOROLAC TROMETHAMINE 30 MG/ML IJ SOLN
INTRAMUSCULAR | Status: DC | PRN
  Administered 2020-11-22: 30 mg

## 2020-11-22 MED ORDER — VITAMIN D (ERGOCALCIFEROL) 1.25 MG (50000 UNIT) PO CAPS
50000.0000 [IU] | ORAL_CAPSULE | ORAL | Status: DC
  Administered 2020-11-22: 50000 [IU] via ORAL
  Filled 2020-11-22: qty 1

## 2020-11-22 MED ORDER — 0.9 % SODIUM CHLORIDE (POUR BTL) OPTIME
TOPICAL | Status: DC | PRN
  Administered 2020-11-22: 1000 mL

## 2020-11-22 MED ORDER — ADULT MULTIVITAMIN W/MINERALS CH
1.0000 | ORAL_TABLET | Freq: Every day | ORAL | Status: DC
  Administered 2020-11-23 – 2020-11-27 (×5): 1 via ORAL
  Filled 2020-11-22 (×5): qty 1

## 2020-11-22 MED ORDER — POTASSIUM CHLORIDE 10 MEQ/100ML IV SOLN
10.0000 meq | INTRAVENOUS | Status: AC
  Administered 2020-11-22 (×3): 10 meq via INTRAVENOUS
  Filled 2020-11-22 (×3): qty 100

## 2020-11-22 MED ORDER — ALBUMIN HUMAN 5 % IV SOLN: INTRAVENOUS | Status: DC | PRN

## 2020-11-22 MED ORDER — RESOURCE INSTANT PROTEIN PO PWD PACKET
1.0000 | Freq: Three times a day (TID) | ORAL | Status: DC
  Administered 2020-11-22: 6 g via ORAL
  Filled 2020-11-22 (×16): qty 6

## 2020-11-22 MED ORDER — CEFAZOLIN SODIUM-DEXTROSE 2-4 GM/100ML-% IV SOLN
2.0000 g | INTRAVENOUS | Status: AC
  Administered 2020-11-22: 2 g via INTRAVENOUS
  Filled 2020-11-22: qty 100

## 2020-11-22 MED ORDER — METOCLOPRAMIDE HCL 5 MG PO TABS
5.0000 mg | ORAL_TABLET | Freq: Three times a day (TID) | ORAL | Status: DC | PRN
Start: 2020-11-22 — End: 2020-11-27

## 2020-11-22 MED ORDER — METOCLOPRAMIDE HCL 5 MG/ML IJ SOLN: 5.0000 mg | Freq: Three times a day (TID) | INTRAMUSCULAR | Status: DC | PRN

## 2020-11-22 SURGICAL SUPPLY — 57 items
ACETAB CUP W/GRIPTION 54 (Plate) ×2 IMPLANT
ADH SKN CLS APL DERMABOND .7 (GAUZE/BANDAGES/DRESSINGS) ×1
APL PRP STRL LF DISP 70% ISPRP (MISCELLANEOUS) ×1
BLADE CLIPPER SURG (BLADE) IMPLANT
CHLORAPREP W/TINT 26 (MISCELLANEOUS) ×2 IMPLANT
COVER SURGICAL LIGHT HANDLE (MISCELLANEOUS) ×2 IMPLANT
COVER WAND RF STERILE (DRAPES) ×2 IMPLANT
CUP ACETAB W/GRIPTION 54 (Plate) ×1 IMPLANT
DERMABOND ADVANCED (GAUZE/BANDAGES/DRESSINGS) ×1
DERMABOND ADVANCED .7 DNX12 (GAUZE/BANDAGES/DRESSINGS) ×1 IMPLANT
DRAPE IMP U-DRAPE 54X76 (DRAPES) ×2 IMPLANT
DRAPE SHEET LG 3/4 BI-LAMINATE (DRAPES) ×4 IMPLANT
DRAPE STERI IOBAN 125X83 (DRAPES) ×2 IMPLANT
DRAPE U-SHAPE 47X51 STRL (DRAPES) ×4 IMPLANT
DRSG AQUACEL AG ADV 3.5X10 (GAUZE/BANDAGES/DRESSINGS) ×2 IMPLANT
ELECT REM PT RETURN 15FT ADLT (MISCELLANEOUS) ×2 IMPLANT
EVACUATOR 1/8 PVC DRAIN (DRAIN) IMPLANT
GLOVE BIO SURGEON STRL SZ8.5 (GLOVE) ×4 IMPLANT
GLOVE BIOGEL PI IND STRL 8.5 (GLOVE) ×1 IMPLANT
GLOVE BIOGEL PI INDICATOR 8.5 (GLOVE) ×1
GLOVE SRG 8 PF TXTR STRL LF DI (GLOVE) ×1 IMPLANT
GLOVE SURG ENC TEXT LTX SZ7.5 (GLOVE) ×4 IMPLANT
GLOVE SURG UNDER POLY LF SZ8 (GLOVE) ×2
GOWN STRL REUS W/ TWL LRG LVL3 (GOWN DISPOSABLE) ×1 IMPLANT
GOWN STRL REUS W/TWL 2XL LVL3 (GOWN DISPOSABLE) ×2 IMPLANT
GOWN STRL REUS W/TWL LRG LVL3 (GOWN DISPOSABLE) ×2
HANDPIECE INTERPULSE COAX TIP (DISPOSABLE) ×2
HEAD CERAMIC 36 PLUS5 (Hips) ×2 IMPLANT
HOOD PEEL AWAY FLYTE STAYCOOL (MISCELLANEOUS) ×6 IMPLANT
JET LAVAGE IRRISEPT WOUND (IRRIGATION / IRRIGATOR) ×2
KIT TURNOVER KIT A (KITS) IMPLANT
LAVAGE JET IRRISEPT WOUND (IRRIGATION / IRRIGATOR) ×1 IMPLANT
LINER NEUTRAL 36ID 54OD (Liner) ×2 IMPLANT
MANIFOLD NEPTUNE II (INSTRUMENTS) ×2 IMPLANT
MARKER SKIN DUAL TIP RULER LAB (MISCELLANEOUS) ×2 IMPLANT
NEEDLE SPNL 18GX3.5 QUINCKE PK (NEEDLE) ×2 IMPLANT
NS IRRIG 1000ML POUR BTL (IV SOLUTION) ×2 IMPLANT
PACK ANTERIOR HIP CUSTOM (KITS) ×2 IMPLANT
PENCIL SMOKE EVACUATOR (MISCELLANEOUS) IMPLANT
SAW OSC TIP CART 19.5X105X1.3 (SAW) ×2 IMPLANT
SEALER BIPOLAR AQUA 6.0 (INSTRUMENTS) ×2 IMPLANT
SET HNDPC FAN SPRY TIP SCT (DISPOSABLE) ×1 IMPLANT
STAPLER VISISTAT 35W (STAPLE) ×2 IMPLANT
STEM TRI LOC BPS SZ8 W GRIPTON ×1 IMPLANT
SUT ETHIBOND NAB CT1 #1 30IN (SUTURE) ×4 IMPLANT
SUT MNCRL AB 3-0 PS2 18 (SUTURE) ×2 IMPLANT
SUT MON AB 2-0 CT1 36 (SUTURE) ×4 IMPLANT
SUT STRATAFIX PDO 1 14 VIOLET (SUTURE) ×2
SUT STRATFX PDO 1 14 VIOLET (SUTURE) ×1
SUT VIC AB 1 CT1 27 (SUTURE) ×2
SUT VIC AB 1 CT1 27XBRD ANTBC (SUTURE) ×1 IMPLANT
SUT VIC AB 2-0 CT1 27 (SUTURE) ×2
SUT VIC AB 2-0 CT1 TAPERPNT 27 (SUTURE) ×1 IMPLANT
SUTURE STRATFX PDO 1 14 VIOLET (SUTURE) ×1 IMPLANT
TRAY FOLEY MTR SLVR 16FR STAT (SET/KITS/TRAYS/PACK) IMPLANT
TRI LOC BPS SZ8 W GRIPTON ×2 IMPLANT
WATER STERILE IRR 1000ML POUR (IV SOLUTION) ×4 IMPLANT

## 2020-11-22 NOTE — Interval H&P Note (Signed)
History and Physical Interval Note:  11/22/2020 9:18 AM  Mariah Bruce  has presented today for surgery, with the diagnosis of left femoral neck fracture.  The various methods of treatment have been discussed with the patient and family. After consideration of risks, benefits and other options for treatment, the patient has consented to  Procedure(s): ANTERIOR APPROACH HEMI HIP ARTHROPLASTY (Left) as a surgical intervention.  The patient's history has been reviewed, patient examined, no change in status, stable for surgery.  I have reviewed the patient's chart and labs.  Questions were answered to the patient's satisfaction.    Patient on board with surgery.  The risks, benefits, and alternatives were discussed with the patient. There are risks associated with the surgery including, but not limited to, problems with anesthesia (death), infection, instability (giving out of the joint), dislocation, differences in leg length/angulation/rotation, fracture of bones, loosening or failure of implants, hematoma (blood accumulation) which may require surgical drainage, blood clots, pulmonary embolism, nerve injury (foot drop and lateral thigh numbness), and blood vessel injury. The patient understands these risks and elects to proceed.    Mariah Bruce

## 2020-11-22 NOTE — Progress Notes (Signed)
Initial Nutrition Assessment  DOCUMENTATION CODES:   Underweight,Severe malnutrition in context of chronic illness  INTERVENTION:   -Magic cup TID with meals, each supplement provides 290 kcal and 9 grams of protein  -Beneprotein powder with meals, each provides 25 kcals and 6g protein  -Multivitamin with minerals daily  -Double protein portions with meals  NUTRITION DIAGNOSIS:   Severe Malnutrition related to chronic illness (COPD, lung cancer) as evidenced by severe fat depletion,severe muscle depletion,energy intake < or equal to 75% for > or equal to 1 month.  GOAL:   Patient will meet greater than or equal to 90% of their needs  MONITOR:   PO intake,Supplement acceptance,Labs,Weight trends,I & O's  REASON FOR ASSESSMENT:   Consult Hip fracture protocol  ASSESSMENT:   75 y.o. female who complains of left hip pain secondary to a fall. The patient has PMH significant of CKD stage II, lung cancer, COPD, anxiety, depression, GERD, tobacco abuse. Admitted for hip fracture.  Patient currently in OR for surgery to repair left hip fracture. NPO. Once diet advanced, will order Magic cups and order double protein portions. Per previous nutrition evaluation, pt does not prefer Ensure/Boost drinks.   Per weight records, no weight loss noted. Pt most recently diagnosed with severe malnutrition on 1/13. This most likely continues. No reports of improved nutrition.   Medications: IV Thiamine, Lactated ringers  Labs reviewed: Low Na, K  NUTRITION - FOCUSED PHYSICAL EXAM:  In OR, last exam completed 1/13, suspect depletions remain the same as 7 days ago. Severe fat and severe muscle depletions.   Diet Order:   Diet Order            Diet NPO time specified Except for: Sips with Meds  Diet effective midnight           Diet NPO time specified  Diet effective midnight                 EDUCATION NEEDS:   No education needs have been identified at this time  Skin:   Skin Assessment: Reviewed RN Assessment  Last BM:  1/20  Height:   Ht Readings from Last 1 Encounters:  11/22/20 6' (1.829 m)    Weight:   Wt Readings from Last 1 Encounters:  11/22/20 45.2 kg   BMI:  Body mass index is 13.51 kg/m.  Estimated Nutritional Needs:   Kcal:  1550-1750  Protein:  80-90g  Fluid:  1.7L/day  Clayton Bibles, MS, RD, LDN Inpatient Clinical Dietitian Contact information available via Amion

## 2020-11-22 NOTE — Anesthesia Procedure Notes (Addendum)
Procedure Name: MAC Date/Time: 11/22/2020 9:36 AM Performed by: West Pugh, CRNA Pre-anesthesia Checklist: Patient identified, Emergency Drugs available, Suction available, Patient being monitored and Timeout performed Patient Re-evaluated:Patient Re-evaluated prior to induction Oxygen Delivery Method: Simple face mask Preoxygenation: Pre-oxygenation with 100% oxygen Induction Type: IV induction Placement Confirmation: positive ETCO2 Dental Injury: Teeth and Oropharynx as per pre-operative assessment

## 2020-11-22 NOTE — Anesthesia Postprocedure Evaluation (Signed)
Anesthesia Post Note  Patient: Jalayne Ganesh  Procedure(s) Performed: ANTERIOR APPROACH HIP ARTHROPLASTY (Left )     Patient location during evaluation: PACU Anesthesia Type: Spinal Level of consciousness: oriented and awake and alert Pain management: pain level controlled Vital Signs Assessment: post-procedure vital signs reviewed and stable Respiratory status: spontaneous breathing, respiratory function stable and patient connected to nasal cannula oxygen Cardiovascular status: blood pressure returned to baseline and stable Postop Assessment: no headache, no backache, no apparent nausea or vomiting, spinal receding and patient able to bend at knees Anesthetic complications: no   No complications documented.  Last Vitals:  Vitals:   11/22/20 1315 11/22/20 1330  BP: (!) 160/87 (!) 160/85  Pulse: 85 90  Resp: 11 14  Temp:  36.7 C  SpO2: 98% 98%    Last Pain:  Vitals:   11/22/20 1330  TempSrc:   PainSc: 0-No pain    LLE Motor Response: Purposeful movement (11/22/20 1339) LLE Sensation: Full sensation (11/22/20 1339) RLE Motor Response: Purposeful movement (11/22/20 1339)        Eeva Schlosser,W. EDMOND

## 2020-11-22 NOTE — Progress Notes (Signed)
IV potassium started running to left fa IV. IV flushed prior to start of administration and tubing primed w/ NS. No s/s complications. Patient c/o burning sensation after a few minutes, noted IV site infiltrated. IVF immediately stopped. IV removed. Patient reports pain stopped right after IV removed. MD made aware. Will monitor for complications.

## 2020-11-22 NOTE — Anesthesia Procedure Notes (Signed)
Spinal  Patient location during procedure: OR Start time: 11/22/2020 9:41 AM End time: 11/22/2020 9:46 AM Staffing Performed: anesthesiologist  Anesthesiologist: Roderic Palau, MD Preanesthetic Checklist Completed: patient identified, IV checked, risks and benefits discussed, surgical consent, monitors and equipment checked, pre-op evaluation and timeout performed Spinal Block Patient position: right lateral decubitus Prep: DuraPrep Patient monitoring: cardiac monitor, continuous pulse ox and blood pressure Approach: midline Location: L4-5 Injection technique: single-shot Needle Needle type: Quincke  Needle gauge: 22 G Needle length: 9 cm Assessment Sensory level: T8 Additional Notes Functioning IV was confirmed and monitors were applied. Sterile prep and drape, including hand hygiene and sterile gloves were used. The patient was positioned and the spine was prepped. The skin was anesthetized with lidocaine.  Free flow of clear CSF was obtained prior to injecting local anesthetic into the CSF.  The spinal needle aspirated freely following injection.  The needle was carefully withdrawn.  The patient tolerated the procedure well.

## 2020-11-22 NOTE — Discharge Instructions (Signed)
Information on my medicine - ELIQUIS (apixaban)  This medication education was reviewed with me or my healthcare representative as part of my discharge preparation.  T Why was Eliquis prescribed for you? Eliquis was prescribed for you to reduce the risk of blood clots forming after orthopedic surgery.    What do You need to know about Eliquis? Take your Eliquis TWICE DAILY - one tablet in the morning and one tablet in the evening with or without food.  It would be best to take the dose about the same time each day.  If you have difficulty swallowing the tablet whole please discuss with your pharmacist how to take the medication safely.  Take Eliquis exactly as prescribed by your doctor and DO NOT stop taking Eliquis without talking to the doctor who prescribed the medication.  Stopping without other medication to take the place of Eliquis may increase your risk of developing a clot.  After discharge, you should have regular check-up appointments with your healthcare provider that is prescribing your Eliquis.  What do you do if you miss a dose? If a dose of ELIQUIS is not taken at the scheduled time, take it as soon as possible on the same day and twice-daily administration should be resumed.  The dose should not be doubled to make up for a missed dose.  Do not take more than one tablet of ELIQUIS at the same time.  Important Safety Information A possible side effect of Eliquis is bleeding. You should call your healthcare provider right away if you experience any of the following: ? Bleeding from an injury or your nose that does not stop. ? Unusual colored urine (red or dark brown) or unusual colored stools (red or black). ? Unusual bruising for unknown reasons. ? A serious fall or if you hit your head (even if there is no bleeding).  Some medicines may interact with Eliquis and might increase your risk of bleeding or clotting while on Eliquis. To help avoid this, consult your  healthcare provider or pharmacist prior to using any new prescription or non-prescription medications, including herbals, vitamins, non-steroidal anti-inflammatory drugs (NSAIDs) and supplements.  This website has more information on Eliquis (apixaban): http://www.eliquis.com/eliquis/home  Dr. Rod Can Joint Replacement Specialist [facility] [address] [place], [place] [address] [phone]   TOTAL HIP REPLACEMENT POSTOPERATIVE DIRECTIONS    Hip Rehabilitation, Guidelines Following Surgery   WEIGHT BEARING Weight bearing as tolerated with assist device (walker, cane, etc) as directed, use it as long as suggested by your surgeon or therapist, typically at least 4-6 weeks.  The results of a hip operation are greatly improved after range of motion and muscle strengthening exercises. Follow all safety measures which are given to protect your hip. If any of these exercises cause increased pain or swelling in your joint, decrease the amount until you are comfortable again. Then slowly increase the exercises. Call your caregiver if you have problems or questions.   HOME CARE INSTRUCTIONS  Most of the following instructions are designed to prevent the dislocation of your new hip.  Remove items at home which could result in a fall. This includes throw rugs or furniture in walking pathways.  Continue medications as instructed at time of discharge.  You may have some home medications which will be placed on hold until you complete the course of blood thinner medication.  You may start showering once you are discharged home. Do not remove your dressing. Do not put on socks or  shoes without following the instructions of your caregivers.   Sit on chairs with arms. Use the chair arms to help push yourself up when arising.  Arrange for the use of a toilet seat elevator so you are not sitting low.   Walk with walker as instructed.  You may resume a sexual  relationship in one month or when given the OK by your caregiver.  Use walker as long as suggested by your caregivers.  You may put full weight on your legs and walk as much as is comfortable. Avoid periods of inactivity such as sitting longer than an hour when not asleep. This helps prevent blood clots.  You may return to work once you are cleared by Engineer, production.  Do not drive a car for 6 weeks or until released by your surgeon.  Do not drive while taking narcotics.  Wear elastic stockings for two weeks following surgery during the day but you may remove then at night.  Make sure you keep all of your appointments after your operation with all of your doctors and caregivers. You should call the office at the above phone number and make an appointment for approximately two weeks after the date of your surgery. Please pick up a stool softener and laxative for home use as long as you are requiring pain medications.  ICE to the affected hip every three hours for 30 minutes at a time and then as needed for pain and swelling. Continue to use ice on the hip for pain and swelling from surgery. You may notice swelling that will progress down to the foot and ankle.  This is normal after surgery.  Elevate the leg when you are not up walking on it.   It is important for you to complete the blood thinner medication as prescribed by your doctor.  Continue to use the breathing machine which will help keep your temperature down.  It is common for your temperature to cycle up and down following surgery, especially at night when you are not up moving around and exerting yourself.  The breathing machine keeps your lungs expanded and your temperature down.  RANGE OF MOTION AND STRENGTHENING EXERCISES  These exercises are designed to help you keep full movement of your hip joint. Follow your caregiver's or physical therapist's instructions. Perform all exercises about fifteen times, three times per day or as directed.  Exercise both hips, even if you have had only one joint replacement. These exercises can be done on a training (exercise) mat, on the floor, on a table or on a bed. Use whatever works the best and is most comfortable for you. Use music or television while you are exercising so that the exercises are a pleasant break in your day. This will make your life better with the exercises acting as a break in routine you can look forward to.  Lying on your back, slowly slide your foot toward your buttocks, raising your knee up off the floor. Then slowly slide your foot back down until your leg is straight again.  Lying on your back spread your legs as far apart as you can without causing discomfort.  Lying on your side, raise your upper leg and foot straight up from the floor as far as is comfortable. Slowly lower the leg and repeat.  Lying on your back, tighten up the muscle in the front of your thigh (quadriceps muscles). You can do this by keeping your leg straight and trying to raise your heel  off the floor. This helps strengthen the largest muscle supporting your knee.  Lying on your back, tighten up the muscles of your buttocks both with the legs straight and with the knee bent at a comfortable angle while keeping your heel on the floor.   SKILLED REHAB INSTRUCTIONS: If the patient is transferred to a skilled rehab facility following release from the hospital, a list of the current medications will be sent to the facility for the patient to continue.  When discharged from the skilled rehab facility, please have the facility set up the patient's [place] prior to being released. Also, the skilled facility will be responsible for providing the patient with their medications at time of release from the facility to include their pain medication and their blood thinner medication. If the patient is still at the rehab facility at time of the two week follow up appointment, the skilled rehab  facility will also need to assist the patient in arranging follow up appointment in our office and any transportation needs.  MAKE SURE YOU:  Understand these instructions.  Will watch your condition.  Will get help right away if you are not doing well or get worse.  Pick up stool softner and laxative for home use following surgery while on pain medications. Do not remove your dressing. The dressing is waterproof--it is OK to take showers. Continue to use ice for pain and swelling after surgery. Do not use any lotions or creams on the incision until instructed by your surgeon. Total Hip Protocol.

## 2020-11-22 NOTE — Op Note (Signed)
OPERATIVE REPORT  SURGEON: Rod Can, MD   ASSISTANT: Cherlynn June, PA-C  PREOPERATIVE DIAGNOSIS: Displaced Left femoral neck fracture.   POSTOPERATIVE DIAGNOSIS: Displaced Left femoral neck fracture.   PROCEDURE: Left total hip arthroplasty, anterior approach.   IMPLANTS: DePuy Tri Lock stem, size 8, hi offset. DePuy Pinnacle Cup, size 54 mm. DePuy Altrx liner, size 36 by 54 mm, neutral. DePuy Biolox ceramic head ball, size 36 + 5 mm.  ANESTHESIA:  MAC and Spinal  ANTIBIOTICS: 2g ancef.  ESTIMATED BLOOD LOSS:-200 mL    DRAINS: None.  COMPLICATIONS: None   CONDITION: PACU - hemodynamically stable.   BRIEF CLINICAL NOTE: Mariah Bruce is a 75 y.o. female with a displaced Left femoral neck fracture. The patient was admitted to the hospitalist service and underwent perioperative risk stratification and medical optimization. The risks, benefits, and alternatives to total hip arthroplasty were explained, and the patient elected to proceed.  PROCEDURE IN DETAIL: The patient was taken to the operating room and general anesthesia was induced on the hospital bed.  The patient was then positioned on the [place] table.  All bony prominences were well padded.  The hip was prepped and draped in the normal sterile surgical fashion.  A time-out was called verifying side and site of surgery. Antibiotics were given within 60 minutes of beginning the procedure.  The direct anterior approach to the hip was performed through the Hueter interval.  Lateral femoral circumflex vessels were treated with the Auqumantys. The anterior capsule was exposed and an inverted T capsulotomy was made.  Fracture hematoma was encountered and evacuated. The patient was found to have a comminuted Left subcapital femoral neck fracture.  I freshened the femoral neck cut with a saw.  I removed the femoral neck fragment.  A corkscrew was placed into the head and the head was removed.  This was passed to the back  table and was measured.   Acetabular exposure was achieved, and the acetabulum was inspected. There was full thickness cartilage wear superiorly. The pulvinar and labrum were then excised. Sequential reaming of the acetabulum was then performed up to a size 53 mm reamer. A 54 mm cup was then opened and impacted into place at approximately 40 degrees of abduction and 20 degrees of anteversion. The final polyethylene liner was impacted into place.    I then gained femoral exposure taking care to protect the abductors and greater trochanter.  This was performed using standard external rotation, extension, and adduction.  The capsule was peeled off the inner aspect of the greater trochanter, taking care to preserve the short external rotators. A cookie cutter was used to enter the femoral canal, and then the femoral canal finder was used to confirm location.  I then sequentially broached up to a size 8.  Calcar planer was used on the femoral neck remnant.  I paced a hi neck and a trial head ball. The hip was reduced.  Leg lengths were checked fluoroscopically.  The hip was dislocated and trial components were removed.  I placed the real stem followed by the real spacer and head ball.  The hip was reduced.  Fluoroscopy was used to confirm component position and leg lengths.  At 90 degrees of external rotation and extension, the hip was stable to an anterior directed force. Lengthening of a few millimeters was required to achieve adequate soft tissue tension and stability.   The wound was copiously irrigated with Irrisept solution and normal saline using pule lavage.  Marcaine solution  was injected into the periarticular soft tissue.  The wound was closed in layers using #1 Vicryl and V-Loc for the fascia, 2-0 Vicryl for the subcutaneous fat, 2-0 Monocryl for the deep dermal layer, 3-0 running Monocryl subcuticular stitch and glue for the skin.  Once the glue was fully dried, an Aquacell Ag dressing was applied.   The patient was then awakened from anesthesia and transported to the recovery room in stable condition.  Sponge, needle, and instrument counts were correct at the end of the case x2.  The patient tolerated the procedure well and there were no known complications.  Please note that a surgical assistant was a medical necessity for this procedure to perform it in a safe and expeditious manner. Assistant was necessary to provide appropriate retraction of vital neurovascular structures, to prevent femoral fracture, and to allow for anatomic placement of the prosthesis.  POSTOPERATIVE PLAN: Operatively, patient be readmitted to the hospitalist service.  She may weight-bear as tolerated left lower extremity with a walker.  Due to presence of lung cancer and increased risk of VTE, we will use apixaban for DVT prophylaxis, beginning tomorrow morning.  Mobilize out of bed with PT/OT.  She will undergo disposition planning.  Follow-up in 2 weeks for routine postoperative care.

## 2020-11-22 NOTE — Evaluation (Signed)
SLP Cancellation Note  Patient Details Name: Mariah Bruce MRN: [id number] DOB: 1946/05/23   Cancelled treatment:       Reason Eval/Treat Not Completed: Other (comment) (pt is npo for surgery, off the floor at this time)   Macario Golds 11/22/2020, 1:08 PM  Kathleen Lime, MS [place] Office [phone] Pager [phone]

## 2020-11-22 NOTE — Progress Notes (Signed)
Bactroban ointment ordered scheduled. RN administered per scheduled time. Nasal swab was not completed prior. Ok to hold for now and swab until AM per Sharlet Salina, NP.

## 2020-11-22 NOTE — Transfer of Care (Signed)
Immediate Anesthesia Transfer of Care Note  Patient: Mariah Bruce  Procedure(s) Performed: ANTERIOR APPROACH HIP ARTHROPLASTY (Left )  Patient Location: PACU  Anesthesia Type:MAC and Spinal  Level of Consciousness: awake, alert  and patient cooperative  Airway & Oxygen Therapy: Patient Spontanous Breathing and Patient connected to face mask oxygen  Post-op Assessment: Report given to RN and Post -op Vital signs reviewed and stable  Post vital signs: Reviewed and stable  Last Vitals:  Vitals Value Taken Time  BP 138/71 11/22/20 1145  Temp    Pulse 82 11/22/20 1149  Resp 16 11/22/20 1149  SpO2 100 % 11/22/20 1149  Vitals shown include unvalidated device data.  Last Pain:  Vitals:   11/22/20 0816  TempSrc:   PainSc: 0-No pain      Patients Stated Pain Goal: 0 (00/34/96 1164)  Complications: No complications documented.

## 2020-11-22 NOTE — Progress Notes (Signed)
PROGRESS NOTE    Mariah Bruce  VQM:[id number] DOB: Dec 01, 1945 DOA: 11/20/2020 PCP: Girtha Rm, NP-C   Brief Narrative:   Patient is a 75 y.o.femalewho presented to the ED with complaint of left hip pain secondary to a fall which occurred on 11/19/20.  Since the recent fall, she has been experiencing pain the left hip and was transported by EMS to [facility] ED on 11/20/20. Patient has a past medical history significant for CKD stage II, lung cancer, COPD, anxiety, depression, GERD, tobacco abuse.  Patient has been confused since admission and can not accurately recall the events or timing of the fall.  She is alert t to person, place, and event but not to time. The patient was recently hospitalized with a UTI and treated with IV antibiotics and discharged on 11/15/20 with an outpatient follow up at 7 days post discharge.   Patient has a 55 pack year history of tobacco use and is a current smoker. Patient was diagnosed with lung cancer in 2014. CT on recent admission showed multiple lung nodules. Patient has declined treatment for her lung cancer diagnosis.   ER Course:  Chest x-ray showing small left pleural effusion but otherwise nonacute CT pelvis showed comminuted impacted nondisplaced transcervical left femoral neck fracture.  Patient was not answering when questioned about if she wants to proceed with surgery to replace the left hip. She repeated asks, "where's my sister". Orthopedic surgery was consulted and evaluated the patient, they have discussed with patient's sister who agrees for surgery, planned for 11/22/20. Echocardiogram shows questionable mass in the tricuspid annulus.  Per cardiology, this is not a contraindication for surgery.  Patient was counseled by hospitalist and declines further further workup such as a cardiac MRI at this time. In the past patient has declined work-up for lung cancer.  Patient's blood pressure was elevated in the ED and was started on  hydralazine 10 mg every 4 hours, PRN for BP more than 160/100.   Assessment & Plan:   Active Problems:   Hematuria   Tobacco abuse   Lung cancer (HCC)   Acute metabolic encephalopathy   Protein-calorie malnutrition, severe   Closed left hip fracture (HCC)   Hypokalemia   Abnormal ECG   COPD (chronic obstructive pulmonary disease) (HCC)   Abnormal EKG   Nondisplaced left femoral neck fracture -Patient able to walk a flight of stairs or 100 ft at baseline, not currently an anticoagulants. -Type and screen performed. -Vitamin D decreased at 11.54, replete and obtain repeat level. -CT pelvis indicates left hip fracture, orthopedics consulted, with recommendation provided to plan on left hemiarthroplasty for 11/22/20. -Per orthopedics, patient is likely to be bed bound without procedure -Patient ambivalent about going through with procedure and both requests "where is her sister" and want to talk with sister before moving forward.   -NPO after midnight per orthopedics.  Attempts made by orthopedics to contact patient's sister to obtain consent. -One dose 30 mg Lovenox sq today for DVT prophylaxis. -Orthopedics discussed with patient's sister and she agrees to surgery. -11/22/20  Left total hip arthroplasty, anterior approach performed. -Per Orthopedics, patient may weight-bear as tolerated left lower extremity with a walker.   -Begin Eliquis for DVT prophylaxis on 11/23/20 per Orthopedics -Mobilize out of bed with PT/OT.   -Resume diet as tolerated to heart healthy -Initiate discharge planning with follow-up in 2 weeks for routine postoperative care.  Hypertensive urgency -B/P on admission 196/95 -Hydralazine 10 mg, as needed q4 hrs for B/P >  160/100 -B/P's ranging in 440'N systolic and 02'V diastolic -Continue to monitor  Tobacco abuse -55 pack year history and current smoker -Patient currently is not interested in quitting. -Recommend tobacco cessation counseling. -Continue  nicotine patch.  Protein-calorie malnutrition,severe -Albumin 3.5 ->3.5->3.0->3.3 -Continue IV thiamine -Nutritional consult  Lung cancer -Patient declined further work-up in the past  Hematuria  -UTI, recent admission, unlikely infectious process at this time -Completed course of prescribed po antibiotics as outpatient  Benzodiazepine use.  -Home use of Ativan, UDS positive for benzodiazepines. -Continue Ativan IV as needed for agitation while n.p.o.  Hypokalemia -On admission, potassium 3.4, currently 3.0, replete with 3 runs of KCl and recheck BMET.  -Magnesium and phospate levels, 1.3 and 2.2 respectively.  Abnormal ECG -ECG shows right bundle branch block and nonspecific ST segment changes -Patient denies any chest pain or cardiac symptoms -Echo done 11/21/20, EF 65-70 %, no regional wall motion abnormalities noted. There is abnormal structure on the tricuspid annulus.  COPD(chronic obstructive pulmonary disease), chronic/stable -PRN albuterol as needed and Atrovent scheduled -Chest x-ray showing no evidence of acute infarct, with small left pleural effusion.  Leukocytosis-in the setting of acute hip fracture -WBC's 16.5 ->11.9->8.4, trending down -Likely reactive, continue to monitor  Thrombocytopenia, mild -Platelet level 150->134->124 -No active bleeding, continue to monitor.  DVT prophylaxis:  Apixaban to begin on 11/23/20 Code Status: DNR Family Communication:  Patients sister at bedside, updated.  Status is: Inpatient   Dispo: The patient is from: Home              Anticipated d/c is to: Home              Anticipated d/c date is: 11/25/20              Patient currently is not medically stable for discharge.   Body mass index is 13.51 kg/m.    Consultants:   Orthopedics  Procedures:     11/22/20 Left total hip arthroplasty, anterior approach.   Antimicrobials:   Ancef  Subjective:  Patient seen and assessed at beside  post-operatively.  Patient has no complaints and says that pain in well managed.  She appears comfortable and is eating jello with sister at bedside.  Review of Systems Otherwise negative except as per HPI, including: General: Denies fever, chills, night sweats or unintended weight loss. Resp: Denies cough, wheezing, shortness of breath. Cardiac: Denies chest pain, palpitations, orthopnea, paroxysmal nocturnal dyspnea. GI: Denies abdominal pain, nausea, vomiting, diarrhea or constipation GU: Denies dysuria, frequency, hesitancy or incontinence MS: Reports limited ROM in her lower left hip and extremity, pain currently under control. Denies muscle aches, joint pain or swelling Neuro: Denies headache, neurologic deficits (focal weakness, numbness, tingling), abnormal gait Psych: Denies anxiety, depression, SI/HI/AVH Skin: Denies new rashes or lesions ID: Denies sick contacts, exotic exposures, travel  Examination:  General exam: Appears calm and comfortable  Respiratory system: Clear to auscultation. Respiratory effort normal. Cardiovascular system: S1 & S2 heard, RRR. No JVD, murmurs, rubs, gallops or clicks. No pedal edema. Gastrointestinal system: Abdomen is nondistended, soft and nontender. No organomegaly or masses felt. Normal bowel sounds heard. Central nervous system: Alert and oriented. No focal neurological deficits. Musculoskeletal: Left lower leg limited active and passive ROM. Extremities: Symmetric 5 x 5 power. Skin: No rashes, lesions or ulcers Psychiatry: Judgement and insight appear normal. Mood & affect appropriate.     Objective: Vitals:   11/22/20 1200 11/22/20 1215 11/22/20 1230 11/22/20 1245  BP: (!) 158/85 (!) 169/80 Marland Kitchen)  160/78 (!) 164/80  Pulse: 77 78 79 82  Resp: 16 18 14 19   Temp:      TempSrc:      SpO2: 100% 100% 95% 98%  Weight:      Height:        Intake/Output Summary (Last 24 hours) at 11/22/2020 1305 Last data filed at 11/22/2020 1148 Gross  per 24 hour  Intake 1750 ml  Output 550 ml  Net 1200 ml   Filed Weights   11/21/20 1600 11/22/20 0816  Weight: 45.2 kg 45.2 kg     Data Reviewed:   CBC: Recent Labs  Lab 11/20/20 2334 11/21/20 0444 11/22/20 0553  WBC 15.6* 11.9* 8.4  NEUTROABS 13.7*  --   --   HGB 13.5 11.8* 12.0  HCT 41.1 35.9* 36.1  MCV 99.0 98.4 98.4  PLT 151 134* 355*   Basic Metabolic Panel: Recent Labs  Lab 11/20/20 2334 11/21/20 0145 11/21/20 0444 11/22/20 0553  NA 136  --  136 134*  K 3.4*  --  3.5 3.0*  CL 100  --  102 97*  CO2 26  --  26 27  GLUCOSE 138*  --  107* 86  BUN 26*  --  24* 14  CREATININE 0.93  --  0.70 0.74  CALCIUM 8.8*  --  8.3* 8.3*  MG  --  1.3*  --   --   PHOS  --  2.2*  --   --    GFR: Estimated Creatinine Clearance: 44 mL/min (by C-G formula based on SCr of 0.74 mg/dL). Liver Function Tests: Recent Labs  Lab 11/21/20 0145 11/21/20 0444 11/22/20 0553  AST 27 23 21   ALT 29 25 23   ALKPHOS 103 88 82  BILITOT 1.0 0.9 0.9  PROT 6.1* 5.3* 5.8*  ALBUMIN 3.5 3.0* 3.3*   No results for input(s): LIPASE, AMYLASE in the last 168 hours. Recent Labs  Lab 11/21/20 0145  AMMONIA 15   Coagulation Profile: Recent Labs  Lab 11/21/20 0145  INR 1.1   Cardiac Enzymes: Recent Labs  Lab 11/21/20 0145  CKTOTAL 210   BNP (last 3 results) No results for input(s): PROBNP in the last 8760 hours. HbA1C: No results for input(s): HGBA1C in the last 72 hours. CBG: No results for input(s): GLUCAP in the last 168 hours. Lipid Profile: No results for input(s): CHOL, HDL, LDLCALC, TRIG, CHOLHDL, LDLDIRECT in the last 72 hours. Thyroid Function Tests: No results for input(s): TSH, T4TOTAL, FREET4, T3FREE, THYROIDAB in the last 72 hours. Anemia Panel: No results for input(s): VITAMINB12, FOLATE, FERRITIN, TIBC, IRON, RETICCTPCT in the last 72 hours. Sepsis Labs: No results for input(s): PROCALCITON, LATICACIDVEN in the last 168 hours.  Recent Results (from the past 240  hour(s))  Blood culture (routine x 2)     Status: None   Collection Time: 11/13/20  1:00 AM   Specimen: BLOOD LEFT FOREARM  Result Value Ref Range Status   Specimen Description   Final    BLOOD LEFT FOREARM Performed at [facility], [place] [address] [place], [place] [address]    Special Requests   Final    BOTTLES DRAWN AEROBIC AND ANAEROBIC BLOOD LEFT FOREARM Performed at [facility], [place] [place], [place] [address]    Culture   Final    NO GROWTH 5 DAYS Performed at [facility], [place] [address], [place], [place] [address]    Report Status 11/18/2020 FINAL  Final  Blood culture (routine x 2)     Status: None   Collection Time: 11/13/20  1:52 AM   Specimen: BLOOD LEFT HAND  Result Value Ref Range Status   Specimen Description   Final    BLOOD LEFT HAND Performed at [facility], [place] [address], [place], [place] [address]    Special Requests   Final    BOTTLES DRAWN AEROBIC AND ANAEROBIC LEFT HAND Performed at [facility], [place] [address] [place], [place] [address]    Culture   Final    NO GROWTH 5 DAYS Performed at [facility], [place] [address], [place], [place] [address]    Report Status 11/18/2020 FINAL  Final  Resp Panel by RT-PCR (Flu A&B, Covid) Nasopharyngeal Swab     Status: None   Collection Time: 11/13/20  2:26 AM   Specimen: Nasopharyngeal Swab; Nasopharyngeal(NP) swabs in vial transport medium  Result Value Ref Range Status   SARS Coronavirus 2 by RT PCR NEGATIVE NEGATIVE Final    Comment: (NOTE) SARS-CoV-2 target nucleic acids are NOT DETECTED.  The SARS-CoV-2 RNA is generally detectable in upper respiratory specimens during the acute phase of infection. The lowest concentration of SARS-CoV-2 viral copies this assay can detect is 138 copies/mL. A negative result does not preclude SARS-Cov-2 infection and should not be used as the sole basis for treatment or other patient management decisions. A  negative result may occur with  improper specimen collection/handling, submission of specimen other than nasopharyngeal swab, presence of viral mutation(s) within the areas targeted by this assay, and inadequate number of viral copies(<138 copies/mL). A negative result must be combined with clinical observations, patient history, and epidemiological information. The expected result is Negative.  Fact Sheet for Patients:  EntrepreneurPulse.com.au  Fact Sheet for Healthcare Providers:  IncredibleEmployment.be  This test is no t yet approved or cleared by the [place] FDA and  has been authorized for detection and/or diagnosis of SARS-CoV-2 by FDA under an Emergency Use Authorization (EUA). This EUA will remain  in effect (meaning this test can be used) for the duration of the COVID-19 declaration under Section 564(b)(1) of the Act, 21 U.S.C.section 360bbb-3(b)(1), unless the authorization is terminated  or revoked sooner.       Influenza A by PCR NEGATIVE NEGATIVE Final   Influenza B by PCR NEGATIVE NEGATIVE Final    Comment: (NOTE) The Xpert Xpress SARS-CoV-2/FLU/RSV plus assay is intended as an aid in the diagnosis of influenza from Nasopharyngeal swab specimens and should not be used as a sole basis for treatment. Nasal washings and aspirates are unacceptable for Xpert Xpress SARS-CoV-2/FLU/RSV testing.  Fact Sheet for Patients: EntrepreneurPulse.com.au  Fact Sheet for Healthcare Providers: IncredibleEmployment.be  This test is not yet approved or cleared by the [place] FDA and has been authorized for detection and/or diagnosis of SARS-CoV-2 by FDA under an Emergency Use Authorization (EUA). This EUA will remain in effect (meaning this test can be used) for the duration of the COVID-19 declaration under Section 564(b)(1) of the Act, 21 U.S.C. section 360bbb-3(b)(1), unless the authorization  is terminated or revoked.  Performed at [facility], [place] [place], [place] [address]   Urine culture     Status: None   Collection Time: 11/20/20 11:50 PM   Specimen: Urine, Clean Catch  Result Value Ref Range Status   Specimen Description   Final    URINE, CLEAN CATCH Performed at [facility],  [place] [address] [place], [place] [address]    Special Requests   Final    NONE Performed at [facility], [place] [address] [place], [place] [address]    Culture   Final    NO GROWTH Performed at [facility], [place] [address], [place], [place] [address]    Report Status 11/22/2020 FINAL  Final  SARS CORONAVIRUS 2 (TAT 6-24 HRS) Nasopharyngeal Nasopharyngeal Swab     Status: None   Collection Time: 11/21/20 12:16 AM   Specimen: Nasopharyngeal Swab  Result Value Ref Range Status   SARS Coronavirus 2 NEGATIVE NEGATIVE Final    Comment: (NOTE) SARS-CoV-2 target nucleic acids are NOT DETECTED.  The SARS-CoV-2 RNA is generally detectable in upper and lower respiratory specimens during the acute phase of infection. Negative results do not preclude SARS-CoV-2 infection, do not rule out co-infections with other pathogens, and should not be used as the sole basis for treatment or other patient management decisions. Negative results must be combined with clinical observations, patient history, and epidemiological information. The expected result is Negative.  Fact Sheet for Patients: SugarRoll.be  Fact Sheet for Healthcare Providers: https://www.woods-mathews.com/  This test is not yet approved or cleared by the [place] FDA and  has been authorized for detection and/or diagnosis of SARS-CoV-2 by FDA under an Emergency Use Authorization (EUA). This EUA will remain  in effect (meaning this test can be used) for the duration of the COVID-19 declaration under Se ction 564(b)(1) of  the Act, 21 U.S.C. section 360bbb-3(b)(1), unless the authorization is terminated or revoked sooner.  Performed at [facility], [place] [address], [place], [place] [address]   Surgical PCR screen     Status: None   Collection Time: 11/22/20  5:43 AM   Specimen: Nasal Mucosa; Nasal Swab  Result Value Ref Range Status   MRSA, PCR NEGATIVE NEGATIVE Final   Staphylococcus aureus NEGATIVE NEGATIVE Final    Comment: (NOTE) The Xpert SA Assay (FDA approved for NASAL specimens in patients 14 years of age and older), is one component of a comprehensive surveillance program. It is not intended to diagnose infection nor to guide or monitor treatment. Performed at [facility], [place] [address], [place], [place] [address]          Radiology Studies: CT HEAD WO CONTRAST  Result Date: 11/21/2020 CLINICAL DATA:  Patient fall at home EXAM: CT HEAD WITHOUT CONTRAST TECHNIQUE: Contiguous axial images were obtained from the base of the skull through the vertex without intravenous contrast. COMPARISON:  November 14, 2020 FINDINGS: Brain: No evidence of acute territorial infarction, hemorrhage, hydrocephalus,extra-axial collection or mass lesion/mass effect. There is dilatation the ventricles and sulci consistent with age-related atrophy. Low-attenuation changes in the deep white matter consistent with small vessel ischemia. Vascular: No hyperdense vessel or unexpected calcification. Skull: The skull is intact. No fracture or focal lesion identified. Sinuses/Orbits: The visualized paranasal sinuses and mastoid air cells are clear. The orbits and globes intact. Other: None IMPRESSION: No acute intracranial abnormality. Findings consistent with age related atrophy and chronic small vessel ischemia Electronically Signed   By: Prudencio Pair M.D.   On: 11/21/2020 02:44   CT PELVIS WO CONTRAST  Result Date: 11/21/2020 CLINICAL DATA:  Hip pain after fall EXAM: CT PELVIS WITHOUT CONTRAST  TECHNIQUE: Multidetector CT imaging of the pelvis was performed following the standard protocol without intravenous contrast. COMPARISON:  None. FINDINGS: Urinary Tract: The visualized distal ureters and bladder appear  unremarkable. Bowel: No bowel wall thickening, distention or surrounding inflammation identified within the pelvis. Vascular/Lymphatic: No enlarged pelvic lymph nodes identified. Scattered aortic atherosclerosis is noted. Reproductive: The uterus and adnexa are unremarkable. Other: There is a soft tissue calcifications seen overlying the left inferior ischial tuberosity. Musculoskeletal: There is a comminuted slightly impacted nondisplaced fracture seen through the left transcervical femoral neck. The femoral head is still well seated within the acetabulum. A small hip joint effusion is seen. There is diffuse osteopenia. Again noted are degenerative changes in the lower lumbar spine. The sacroiliac joints appear to be intact. IMPRESSION: Comminuted impacted nondisplaced transcervical left femoral neck fracture. Electronically Signed   By: Prudencio Pair M.D.   On: 11/21/2020 00:13   Pelvis Portable  Result Date: 11/22/2020 CLINICAL DATA:  Status post left hip arthroplasty. EXAM: PORTABLE PELVIS 1-2 VIEWS COMPARISON:  Same day. FINDINGS: The left acetabular and femoral components appear to be well situated. Expected postoperative changes seen in the surrounding soft tissues. IMPRESSION: Status post left total hip arthroplasty. Electronically Signed   By: Marijo Conception M.D.   On: 11/22/2020 12:19   DG CHEST PORT 1 VIEW  Result Date: 11/21/2020 CLINICAL DATA:  Hypoxia EXAM: PORTABLE CHEST 1 VIEW COMPARISON:  November 13, 2020 FINDINGS: The heart size and mediastinal contours are mildly enlarged. Aortic knob calcifications are seen. Again noted is increased interstitial markings with fibrosis and scarring at the right lung apex. A unchanged small left pleural effusion is seen. No acute osseous  abnormality. IMPRESSION: No significant change with the right apical scarring and fibrosis Small left pleural effusion Electronically Signed   By: Prudencio Pair M.D.   On: 11/21/2020 00:16   DG Knee Left Port  Result Date: 11/21/2020 CLINICAL DATA:  Fall. EXAM: PORTABLE LEFT KNEE - 1-2 VIEW COMPARISON:  No prior. FINDINGS: Diffuse osteopenia. Mild degenerative change with chondrocalcinosis. No acute bony abnormality. No evidence of fracture dislocation. No prominent effusion. IMPRESSION: Diffuse osteopenia. Mild degenerative change with chondrocalcinosis. No acute abnormality identified. Electronically Signed   By: Marcello Moores  Register   On: 11/21/2020 08:43   DG C-Arm 1-60 Min-No Report  Result Date: 11/22/2020 Fluoroscopy was utilized by the requesting physician.  No radiographic interpretation.   ECHOCARDIOGRAM COMPLETE  Result Date: 11/21/2020    ECHOCARDIOGRAM REPORT   Patient Name:   LINDYN VOSSLER Date of Exam: 11/21/2020 Medical Rec #:  [id number]        Height:       68.7 in Accession #:    [id number]       Weight:       100.0 lb Date of Birth:  Apr 10, 1946        BSA:          1.534 m Patient Age:    13 years         BP:           197/93 mmHg Patient Gender: F                HR:           89 bpm. Exam Location:  Inpatient Procedure: 2D Echo Indications:    Abnormal ECG R94.31  History:        Patient has no prior history of Echocardiogram examinations.                 COPD.  Sonographer:    Mikki Santee RDCS (AE) Referring Phys: [employer]  1. Left ventricular ejection fraction,  by estimation, is 65 to 70%. The left ventricle has hyperdynamic function. The left ventricle has no regional wall motion abnormalities. Left ventricular diastolic parameters were normal.  2. Right ventricular systolic function is normal. The right ventricular size is normal. There is mildly elevated pulmonary artery systolic pressure.  3. The mitral valve is normal in structure. No evidence of  mitral valve regurgitation. No evidence of mitral stenosis.  4. The aortic valve is grossly normal. Aortic valve regurgitation is not visualized. No aortic stenosis is present.  5. The inferior vena cava is normal in size with greater than 50% respiratory variability, suggesting right atrial pressure of 3 mmHg. Comparison(s): No prior Echocardiogram. Conclusion(s)/Recommendation(s): There is an abnormal structure on the tricuspid annulus (vs adjacent RA/RV). This is seen well on images 76 and 77, however not well appreciated in other views. Could consider additional imaging such as cardiac MRI if it would change clinical management. Findings communicated to Dr. Darrick Meigs at 2:38 PM. FINDINGS  Left Ventricle: LVOT peak gradient 32 mmHg. Left ventricular ejection fraction, by estimation, is 65 to 70%. The left ventricle has hyperdynamic function. The left ventricle has no regional wall motion abnormalities. The left ventricular internal cavity  size was normal in size. There is no left ventricular hypertrophy. Left ventricular diastolic parameters were normal. Right Ventricle: The right ventricular size is normal. Right vetricular wall thickness was not well visualized. Right ventricular systolic function is normal. There is mildly elevated pulmonary artery systolic pressure. The tricuspid regurgitant velocity  is 2.93 m/s, and with an assumed right atrial pressure of 3 mmHg, the estimated right ventricular systolic pressure is 93.2 mmHg. Left Atrium: Left atrial size was normal in size. Right Atrium: Right atrial size was not well visualized. Prominent Eustachian valve. Pericardium: Trivial pericardial effusion is present. Mitral Valve: The mitral valve is normal in structure. No evidence of mitral valve regurgitation. No evidence of mitral valve stenosis. Tricuspid Valve: The tricuspid valve is normal in structure. Tricuspid valve regurgitation is mild. Aortic Valve: The aortic valve is grossly normal. Aortic valve  regurgitation is not visualized. No aortic stenosis is present. Pulmonic Valve: The pulmonic valve was not well visualized. Pulmonic valve regurgitation is not visualized. Aorta: The aortic root and ascending aorta are structurally normal, with no evidence of dilitation. Venous: The inferior vena cava is normal in size with greater than 50% respiratory variability, suggesting right atrial pressure of 3 mmHg. IAS/Shunts: The interatrial septum was not well visualized.  LEFT VENTRICLE PLAX 2D LVIDd:         2.40 cm  Diastology LVIDs:         1.60 cm  LV e' medial:    7.18 cm/s LV PW:         0.90 cm  LV E/e' medial:  11.4 LV IVS:        0.80 cm  LV e' lateral:   8.81 cm/s LVOT diam:     1.80 cm  LV E/e' lateral: 9.3 LV SV:         53 LV SV Index:   34 LVOT Area:     2.54 cm  RIGHT VENTRICLE TAPSE (M-mode): 2.0 cm LEFT ATRIUM             Index LA diam:        2.20 cm 1.43 cm/m LA Vol (A2C):   15.3 ml 9.97 ml/m LA Vol (A4C):   15.3 ml 9.97 ml/m LA Biplane Vol: 16.9 ml 11.01 ml/m  AORTIC VALVE LVOT  Vmax:   134.00 cm/s LVOT Vmean:  90.700 cm/s LVOT VTI:    0.208 m  AORTA Ao Root diam: 2.30 cm MITRAL VALVE                TRICUSPID VALVE MV Area (PHT): 4.29 cm     TR Peak grad:   34.3 mmHg MV Decel Time: 177 msec     TR Vmax:        293.00 cm/s MV E velocity: 81.70 cm/s MV A velocity: 152.00 cm/s  SHUNTS MV E/A ratio:  0.54         Systemic VTI:  0.21 m                             Systemic Diam: 1.80 cm Buford Dresser MD Electronically signed by Buford Dresser MD Signature Date/Time: 11/21/2020/2:39:37 PM    Final    DG HIP PORT UNILAT WITH PELVIS 1V LEFT  Result Date: 11/21/2020 CLINICAL DATA:  Fall yesterday with hip injury EXAM: DG HIP (WITH OR WITHOUT PELVIS) 1V PORT LEFT COMPARISON:  None. FINDINGS: Limited study due to foreshortening of the left femoral neck. There are findings of a nondisplaced femoral neck fracture but need confirmation due to limited projections. No dislocation. No evidence  of pelvic ring fracture or diastasis. IMPRESSION: Findings of nondisplaced left femoral neck fracture. Need confirmatory radiographic or CT imaging given rotation and foreshortening of the femoral neck on all of the provided images. Electronically Signed   By: Monte Fantasia M.D.   On: 11/21/2020 08:41   DG HIP OPERATIVE UNILAT W OR W/O PELVIS LEFT  Result Date: 11/22/2020 CLINICAL DATA:  Left hip replacement EXAM: OPERATIVE left HIP (WITH PELVIS IF PERFORMED) 2 VIEWS TECHNIQUE: Fluoroscopic spot image(s) were submitted for interpretation post-operatively. COMPARISON:  11/21/2020 FINDINGS: Left hip replacement in satisfactory position and alignment. No fracture or complication IMPRESSION: Satisfactory left hip replacement. Electronically Signed   By: Franchot Gallo M.D.   On: 11/22/2020 11:40        Scheduled Meds:  [MAR Hold] enoxaparin (LOVENOX) injection  30 mg Subcutaneous Once   feeding supplement  296 mL Oral Once   [START ON 11/23/2020] multivitamin with minerals  1 tablet Oral Daily   [MAR Hold] mupirocin ointment  1 application Nasal BID   [MAR Hold] nicotine  21 mg Transdermal Daily   povidone-iodine  2 application Topical Once   protein supplement  1 Scoop Oral TID WC   [MAR Hold] thiamine injection  100 mg Intravenous Daily   [MAR Hold] venlafaxine XR  112.5 mg Oral Q breakfast   Continuous Infusions:  lactated ringers 10 mL/hr at 11/22/20 0935   [MAR Hold] methocarbamol (ROBAXIN) IV     methocarbamol (ROBAXIN) IV       LOS: 1 day   Time spent= 20 mins   Dede Query, RN NP Student   If 7PM-7AM, please contact night-coverage  11/22/2020, 1:05 PM

## 2020-11-22 NOTE — Anesthesia Preprocedure Evaluation (Addendum)
Anesthesia Evaluation  Patient identified by MRN, date of birth, ID band Patient awake    Reviewed: Allergy & Precautions, H&P , NPO status , Patient's Chart, lab work & pertinent test results  Airway Mallampati: II  TM Distance: >3 FB Neck ROM: Full    Dental no notable dental hx. (+) Dental Advisory Given, Chipped   Pulmonary COPD, Current Smoker and Patient abstained from smoking.,    Pulmonary exam normal breath sounds clear to auscultation       Cardiovascular negative cardio ROS   Rhythm:Regular Rate:Normal     Neuro/Psych Anxiety Depression negative neurological ROS     GI/Hepatic Neg liver ROS, GERD  Medicated,  Endo/Other  negative endocrine ROS  Renal/GU negative Renal ROS  negative genitourinary   Musculoskeletal   Abdominal   Peds  Hematology negative hematology ROS (+)   Anesthesia Other Findings   Reproductive/Obstetrics negative OB ROS                            Anesthesia Physical Anesthesia Plan  ASA: II  Anesthesia Plan: Spinal   Post-op Pain Management:    Induction: Intravenous  PONV Risk Score and Plan: 2 and Ondansetron and Propofol infusion  Airway Management Planned: Natural Airway and Simple Face Mask  Additional Equipment:   Intra-op Plan:   Post-operative Plan:   Informed Consent: I have reviewed the patients History and Physical, chart, labs and discussed the procedure including the risks, benefits and alternatives for the proposed anesthesia with the patient or authorized representative who has indicated his/her understanding and acceptance.   Patient has DNR.  Discussed DNR with patient and Continue DNR.   Dental advisory given  Plan Discussed with: CRNA  Anesthesia Plan Comments:        Anesthesia Quick Evaluation

## 2020-11-23 DIAGNOSIS — Z515 Encounter for palliative care: Secondary | ICD-10-CM

## 2020-11-23 DIAGNOSIS — Z7189 Other specified counseling: Secondary | ICD-10-CM

## 2020-11-23 DIAGNOSIS — R531 Weakness: Secondary | ICD-10-CM

## 2020-11-23 LAB — BASIC METABOLIC PANEL
Anion gap: 6 (ref 5–15)
Anion gap: 11 (ref 5–15)
BUN: 20 mg/dL (ref 8–23)
BUN: 27 mg/dL — ABNORMAL HIGH (ref 8–23)
CO2: 29 mmol/L (ref 22–32)
CO2: 23 mmol/L (ref 22–32)
Calcium: 8.3 mg/dL — ABNORMAL LOW (ref 8.9–10.3)
Calcium: 8.6 mg/dL — ABNORMAL LOW (ref 8.9–10.3)
Chloride: 100 mmol/L (ref 98–111)
Chloride: 101 mmol/L (ref 98–111)
Creatinine, Ser: 0.84 mg/dL (ref 0.44–1.00)
Creatinine, Ser: 1.14 mg/dL — ABNORMAL HIGH (ref 0.44–1.00)
GFR, Estimated: >60 mL/min (ref >60)
GFR, Estimated: 51 mL/min — ABNORMAL LOW (ref >60)
Glucose, Bld: 123 mg/dL — ABNORMAL HIGH (ref 70–99)
Glucose, Bld: 128 mg/dL — ABNORMAL HIGH (ref 70–99)
Potassium: 4.4 mmol/L (ref 3.5–5.1)
Potassium: 3.7 mmol/L (ref 3.5–5.1)
Sodium: 135 mmol/L (ref 135–145)
Sodium: 135 mmol/L (ref 135–145)

## 2020-11-23 LAB — CBC
HCT: 30.4 % — ABNORMAL LOW (ref 36.0–46.0)
Hemoglobin: 10 g/dL — ABNORMAL LOW (ref 12.0–15.0)
MCH: 33.1 pg (ref 26.0–34.0)
MCHC: 32.9 g/dL (ref 30.0–36.0)
MCV: 100.7 fL — ABNORMAL HIGH (ref 80.0–100.0)
Platelets: 138 10*3/uL — ABNORMAL LOW (ref 150–400)
RBC: 3.02 MIL/uL — ABNORMAL LOW (ref 3.87–5.11)
RDW: 13.9 % (ref 11.5–15.5)
WBC: 9.9 10*3/uL (ref 4.0–10.5)
nRBC: 0 % (ref 0.0–0.2)

## 2020-11-23 MED ORDER — BISACODYL 10 MG RE SUPP
10.0000 mg | Freq: Once | RECTAL | Status: AC
  Administered 2020-11-23: 10 mg via RECTAL
  Filled 2020-11-23: qty 1

## 2020-11-23 MED ORDER — THIAMINE HCL 100 MG PO TABS
100.0000 mg | ORAL_TABLET | Freq: Every day | ORAL | Status: DC
  Administered 2020-11-24 – 2020-11-27 (×4): 100 mg via ORAL
  Filled 2020-11-23 (×4): qty 1

## 2020-11-23 MED ORDER — SODIUM CHLORIDE 0.9 % IV SOLN: INTRAVENOUS | Status: DC

## 2020-11-23 NOTE — Progress Notes (Signed)
PROGRESS NOTE    Mariah Bruce  WPY:[id number] DOB: 1946/01/04 DOA: 11/20/2020 PCP: Girtha Rm, NP-C   Brief Narrative:   Patient is a 75 y.o.femalewho presented to the ED with complaint of left hip pain secondary to a fall which occurred on 11/19/20. Since the recent fall, she has been experiencing pain the left hip and was transported by EMS to [facility] ED on 11/20/20. Patient has a past medical history significant for CKD stage II, lung cancer, COPD, anxiety, depression, GERD, tobacco abuse.  Patient has been confused since admission and can not accurately recall the events or timing of the fall. She is alert t to person, place, and event but not to time. The patient was recently hospitalized with a UTI and treated with IV antibiotics and discharged on 11/15/20 with an outpatient follow up at 7 days post discharge.   Patient has a 55 pack year history of tobacco use and is a current smoker. Patient was diagnosed with lung cancer in 2014. CT on recent admission showed multiple lung nodules. Patient has declined treatment for her lung cancer diagnosis.   ER Course:  Chest x-ray showing small left pleural effusion but otherwise nonacute CT pelvis showed comminuted impacted nondisplaced transcervical left femoral neck fracture.  Patient was not answering when questioned about if she wants to proceed with surgery to replace the left hip. She repeated asks, "where's my sister". Orthopedic surgery was consulted and evaluated the patient, they have discussed with patient's sister who agrees for surgery, planned for 11/22/20. Echocardiogram shows questionable mass in the tricuspid annulus.  Per cardiology, this is not a contraindication for surgery. Patient was counseled by hospitalist and declines further further workup such as a cardiac MRI at this time.  In the past patient has declined work-up for lung cancer. Patient's blood pressure was elevated in the ED and was started  on hydralazine 10 mg every 4 hours, PRN for BP more than 160/100.   Assessment & Plan:   Active Problems:   Hematuria   Tobacco abuse   Lung cancer (HCC)   Acute metabolic encephalopathy   Protein-calorie malnutrition, severe   Closed left hip fracture (HCC)   Hypokalemia   Abnormal ECG   COPD (chronic obstructive pulmonary disease) (HCC)   Abnormal EKG   Nondisplaced left femoral neck fracture -Patient able to walk a flight of stairs or 100 ft at baseline, not currently an anticoagulants. -Type and screen performed. -Vitamin D decreased at 11.54, started on 5,000 U Vitamin D q7 days for 4 weeks and recommend to follow-up Vitamin D levels as outpatient at 4-6 weeks. -CT pelvis indicates left hip fracture, orthopedics consulted, with recommendation for left hemiarthroplasty for 11/22/20. Per orthopedics, patient is likely to be bed bound without procedure, consent obtained per patient's sister. -11/22/20 Lefttotal hip arthroplasty, anterior approach performed. -11/24/19 Eliquis initiated -Weight-bearing as tolerated left lower extremity with a walker.   -Foley removed today POD1, patient due to void. -Mobilize out of bed with PT/OT.  -Current PT recommendations for discharge planning are for SNF or Home Health PT, dependent on progress and level of assist famly can provide -Advance diet as tolerated to heart healthy -Follow-up in 2 weeks for routine postoperative care.  Hypertensive urgency -B/P on admission 196/95 -Hydralazine 10 mg, as needed q4 hrs for B/P >160/100 -B/P's ranging in 053'Z systolic and 76'B diastolic on admission, past 24 hrs SBP 120's-130. -Continue to monitor  Tobacco abuse -55 pack year history and current smoker -Patient currently  is not interested in quitting. -Recommend tobacco cessation counseling. -Continue nicotine patch.  Protein-calorie malnutrition,severe -Albumin 3.5 ->3.5->3.0->3.3 -Continue IV thiamine -Nutritional consult  Lung  cancer -Patient declined further work-upin the past  Hematuria  -UTI, recent admission, unlikely infectious process at this time -Completedcourse of prescribed po antibiotics as outpatient  Benzodiazepine use.  -Home use of Ativan, UDS positive for benzodiazepines. -Continue Ativan IV as needed for agitation while n.p.o.  Hypokalemia -potassium 3.4->3.3->4.4  -Replete with 3 runs of KCl 11/22/20 -Magnesium and phospate levels, 1.3 and 2.2 respectively.  Abnormal ECG -ECG shows right bundle branch block and nonspecific ST segment changes -Patient denies any chest pain or cardiac symptoms -Echo done 11/21/20, EF 65-70 %, no regional wall motion abnormalities noted. There is abnormal structure on the tricuspid annulus. -Discussed with patient and family member, currently decline further work-up by cardiac MRI at this time.  COPD(chronic obstructive pulmonary disease), chronic/stable -PRN albuterol as needed and Atrovent scheduled -Chest x-ray showing no evidence of acute infarct, with small left pleural effusion.  Leukocytosis-in the setting of acute hip fracture -WBC's 16.5 ->11.9->8.4->9.9, trending down -Likely reactive, continue to monitor  Thrombocytopenia, mild -Platelet level 150->134->124->138 -No active bleeding, continue to monitor.   DVT prophylaxis: Eliquis Code Status: DNR Family Communication:  Sister not present at bedside, updated by phone  Status is: Inpatient   Dispo: The patient is from: Home              Anticipated d/c is to: SNF vs Home              Anticipated d/c date is: 11/26/20              Patient currently is not medically stable for discharge.   Difficult to place patient No.  Barriers to discharge: Mobility.   Body mass index is 13.51 kg/m.    Consultants:   Orthopedics  Procedures:   11/22/20 Lefttotal hip arthroplasty, anterior approach.    Antimicrobials:   Ancef   Subjective:  Patient seen and assessed at  beside and is post-operative day 1.  Patient has no complaints and says that she has mild pain but her pain is well managed.  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 However, awaiting physical therapy's recommendation for discharge.  Review of Systems Otherwise negative except as per HPI, including: General: Denies fever, chills, night sweats or unintended weight loss. Resp: Denies cough, wheezing, shortness of breath. Cardiac: Denies chest pain, palpitations, orthopnea, paroxysmal nocturnal dyspnea. GI: Denies abdominal pain, nausea, vomiting, diarrhea or constipation GU: Denies dysuria, frequency, hesitancy or incontinence MS: Reports limited ROMin her lower left hip and extremity, pain currently under control. Denies any issues post surgery. Neuro: Denies headache, neurologic deficits (focal weakness, numbness, tingling), abnormal gait Psych: Denies anxiety, depression, SI/HI/AVH Skin: Denies new rashes or lesions ID: Denies sick contacts, exotic exposures, travel  Examination:  General exam: Appears calm and comfortable  Respiratory system: Clear to auscultation. Respiratory effort normal. Cardiovascular system: S1 & S2 heard, RRR. No JVD, murmurs, rubs, gallops or clicks. No pedal edema. Gastrointestinal system: Abdomen is nondistended, soft and nontender. No organomegaly or masses felt. Normal bowel sounds heard. Genitourinary System: Foley catheter removed today, patient due to void. Central nervous system: Alert and oriented. No focal neurological deficits. Musculoskeletal: Left lower leg limited active and passive ROM. Extremities: Symmetric 5 x 5 power. Skin: No rashes, lesions or ulcers Psychiatry: Judgement and insight appear normal. Mood &  affect appropriate.     Objective: Vitals:   11/23/20 0138 11/23/20 0407 11/23/20 0907 11/23/20 1240  BP: 119/68 136/73 111/61 125/73  Pulse: 73 75 79 92  Resp: 15 16 18 16    Temp: 98 F (36.7 C) 97.7 F (36.5 C) 97.7 F (36.5 C) (!) 97.4 F (36.3 C)  TempSrc:  Oral Oral Oral  SpO2: 100% 100% 100% 98%  Weight:      Height:        Intake/Output Summary (Last 24 hours) at 11/23/2020 1243 Last data filed at 11/23/2020 0407 Gross per 24 hour  Intake 304.02 ml  Output 1700 ml  Net -1395.98 ml   Filed Weights   11/21/20 1600 11/22/20 0816  Weight: 45.2 kg 45.2 kg     Data Reviewed:   CBC: Recent Labs  Lab 11/20/20 2334 11/21/20 0444 11/22/20 0553 11/23/20 0232  WBC 15.6* 11.9* 8.4 9.9  NEUTROABS 13.7*  --   --   --   HGB 13.5 11.8* 12.0 10.0*  HCT 41.1 35.9* 36.1 30.4*  MCV 99.0 98.4 98.4 100.7*  PLT 151 134* 124* 371*   Basic Metabolic Panel: Recent Labs  Lab 11/20/20 2334 11/21/20 0145 11/21/20 0444 11/22/20 0553 11/23/20 0232  NA 136  --  136 134* 135  K 3.4*  --  3.5 3.0* 4.4  CL 100  --  102 97* 100  CO2 26  --  26 27 29   GLUCOSE 138*  --  107* 86 123*  BUN 26*  --  24* 14 20  CREATININE 0.93  --  0.70 0.74 0.84  CALCIUM 8.8*  --  8.3* 8.3* 8.3*  MG  --  1.3*  --   --   --   PHOS  --  2.2*  --   --   --    GFR: Estimated Creatinine Clearance: 41.9 mL/min (by C-G formula based on SCr of 0.84 mg/dL). Liver Function Tests: Recent Labs  Lab 11/21/20 0145 11/21/20 0444 11/22/20 0553  AST 27 23 21   ALT 29 25 23   ALKPHOS 103 88 82  BILITOT 1.0 0.9 0.9  PROT 6.1* 5.3* 5.8*  ALBUMIN 3.5 3.0* 3.3*   No results for input(s): LIPASE, AMYLASE in the last 168 hours. Recent Labs  Lab 11/21/20 0145  AMMONIA 15   Coagulation Profile: Recent Labs  Lab 11/21/20 0145  INR 1.1   Cardiac Enzymes: Recent Labs  Lab 11/21/20 0145  CKTOTAL 210   BNP (last 3 results) No results for input(s): PROBNP in the last 8760 hours. HbA1C: No results for input(s): HGBA1C in the last 72 hours. CBG: No results for input(s): GLUCAP in the last 168 hours. Lipid Profile: No results for input(s): CHOL, HDL, LDLCALC, TRIG, CHOLHDL,  LDLDIRECT in the last 72 hours. Thyroid Function Tests: No results for input(s): TSH, T4TOTAL, FREET4, T3FREE, THYROIDAB in the last 72 hours. Anemia Panel: No results for input(s): VITAMINB12, FOLATE, FERRITIN, TIBC, IRON, RETICCTPCT in the last 72 hours. Sepsis Labs: No results for input(s): PROCALCITON, LATICACIDVEN in the last 168 hours.  Recent Results (from the past 240 hour(s))  Urine culture     Status: None   Collection Time: 11/20/20 11:50 PM   Specimen: Urine, Clean Catch  Result Value Ref Range Status   Specimen Description   Final    URINE, CLEAN CATCH Performed at [facility], [place] [address], [place], [place] [address]    Special Requests   Final    NONE Performed at  [facility] [place] [address] [place], [place] [address]    Culture   Final    NO GROWTH Performed at [facility], [place] [address] [place], [place] [address]    Report Status 11/22/2020 FINAL  Final  SARS CORONAVIRUS 2 (TAT 6-24 HRS) Nasopharyngeal Nasopharyngeal Swab     Status: None   Collection Time: 11/21/20 12:16 AM   Specimen: Nasopharyngeal Swab  Result Value Ref Range Status   SARS Coronavirus 2 NEGATIVE NEGATIVE Final    Comment: (NOTE) SARS-CoV-2 target nucleic acids are NOT DETECTED.  The SARS-CoV-2 RNA is generally detectable in upper and lower respiratory specimens during the acute phase of infection. Negative results do not preclude SARS-CoV-2 infection, do not rule out co-infections with other pathogens, and should not be used as the sole basis for treatment or other patient management decisions. Negative results must be combined with clinical observations, patient history, and epidemiological information. The expected result is Negative.  Fact Sheet for Patients: SugarRoll.be  Fact Sheet for Healthcare Providers: https://www.woods-mathews.com/  This test is not yet approved or cleared by the  [place] FDA and  has been authorized for detection and/or diagnosis of SARS-CoV-2 by FDA under an Emergency Use Authorization (EUA). This EUA will remain  in effect (meaning this test can be used) for the duration of the COVID-19 declaration under Se ction 564(b)(1) of the Act, 21 U.S.C. section 360bbb-3(b)(1), unless the authorization is terminated or revoked sooner.  Performed at [facility], [place] [address], [place], [place] [address]   Surgical PCR screen     Status: None   Collection Time: 11/22/20  5:43 AM   Specimen: Nasal Mucosa; Nasal Swab  Result Value Ref Range Status   MRSA, PCR NEGATIVE NEGATIVE Final   Staphylococcus aureus NEGATIVE NEGATIVE Final    Comment: (NOTE) The Xpert SA Assay (FDA approved for NASAL specimens in patients 55 years of age and older), is one component of a comprehensive surveillance program. It is not intended to diagnose infection nor to guide or monitor treatment. Performed at [facility], [place] [address], [place], [place] [address]          Radiology Studies: Pelvis Portable  Result Date: 11/22/2020 CLINICAL DATA:  Status post left hip arthroplasty. EXAM: PORTABLE PELVIS 1-2 VIEWS COMPARISON:  Same day. FINDINGS: The left acetabular and femoral components appear to be well situated. Expected postoperative changes seen in the surrounding soft tissues. IMPRESSION: Status post left total hip arthroplasty. Electronically Signed   By: Marijo Conception M.D.   On: 11/22/2020 12:19   DG C-Arm 1-60 Min-No Report  Result Date: 11/22/2020 Fluoroscopy was utilized by the requesting physician.  No radiographic interpretation.   DG HIP OPERATIVE UNILAT W OR W/O PELVIS LEFT  Result Date: 11/22/2020 CLINICAL DATA:  Left hip replacement EXAM: OPERATIVE left HIP (WITH PELVIS IF PERFORMED) 2 VIEWS TECHNIQUE: Fluoroscopic spot image(s) were submitted for interpretation post-operatively. COMPARISON:  11/21/2020 FINDINGS: Left  hip replacement in satisfactory position and alignment. No fracture or complication IMPRESSION: Satisfactory left hip replacement. Electronically Signed   By: Franchot Gallo M.D.   On: 11/22/2020 11:40        Scheduled Meds: . apixaban  2.5 mg Oral BID  . Chlorhexidine Gluconate Cloth  6 each Topical Daily  . docusate sodium  100 mg Oral BID  . multivitamin with minerals  1 tablet Oral Daily  . mupirocin ointment  1 application Nasal BID  .  nicotine  21 mg Transdermal Daily  . protein supplement  1 Scoop Oral TID WC  . senna  1 tablet Oral BID  . [START ON 11/24/2020] thiamine  100 mg Oral Daily  . venlafaxine XR  112.5 mg Oral Q breakfast  . Vitamin D (Ergocalciferol)  50,000 Units Oral Q7 days   Continuous Infusions: . methocarbamol (ROBAXIN) IV       LOS: 2 days   Time spent= 20 mins   Dede Query, RN NP Student   If 7PM-7AM, please contact night-coverage  11/23/2020, 12:43 PM

## 2020-11-23 NOTE — Evaluation (Signed)
Occupational Therapy Evaluation Patient Details Name: Mariah Bruce MRN: [id number] DOB: 01/06/1946 Today's Date: 11/23/2020    History of Present Illness Pt is 75 yo female with PMH including lung cancer, COPD, anxiety, depression, GERD and tobacco abuse.  Pt is s/p fall with L hip fx and now s/p L THR by anterior direct approach.   Clinical Impression   Patient is currently requiring assistance with ADLs including maximum with toileting (pt reports no BM for 3-4 days), Total assist with LE dressing, maximum with bathing the LB, and minimal assist with grooming and UE dressing, all of which is below patient's typical baseline of being Modified independent.  During this evaluation, patient was limited by confusion/agitation, abdominal pain, generalized weakness and poor activity tolerance, all of which has the potential to impact patient's and caregiver's safety and independence during functional mobility, as well as performance for ADLs. [place] "6-clicks" Daily Activity Inpatient Short Form score of 15/24 indicates 56.46% ADL impairment this session. Patient lives with her family in [place], but plans to discharge to her sister's home. Currently pt requires more assistance than her sister can safely provide, but is lacking insight into this.  Patient demonstrates fair rehab potential, and should benefit from continued skilled occupational therapy services while in acute care to maximize safety, independence and quality of life at home.  Continued occupational therapy services in a SNF setting prior to return home is recommended.  ?   Follow Up Recommendations  SNF ([facility] if pt refuses SNF)    Equipment Recommendations       Recommendations for Other Services       Precautions / Restrictions Precautions Precautions: Fall Restrictions Weight Bearing Restrictions: No Other Position/Activity Restrictions: WBAT      Mobility Bed Mobility Overal bed mobility:  Needs Assistance Bed Mobility: Supine to Sit     Supine to sit: Max assist;+2 for physical assistance     General bed mobility comments: increased time with cues for sequence and use of R LE to self assist.  Physical assist to manage R LE, to control trunk and to complete transition to EOB sitting with use of bed pad    Transfers Overall transfer level: Needs assistance Equipment used: Rolling walker (2 wheeled) Transfers: Sit to/from Stand Sit to Stand: Mod assist;+2 physical assistance         General transfer comment: increased time with cues for sequence and need of multimodal cues to place hands on arms of recliner to pull upper body anteriorly to prepare for stand as pt unable to follow verbal cues alone. Multimodal cues for UE reach back to control descent to EOB.    Balance Overall balance assessment: Needs assistance Sitting-balance support: No upper extremity supported;Feet supported Sitting balance-Leahy Scale: Fair     Standing balance support: Bilateral upper extremity supported Standing balance-Leahy Scale: Poor Standing balance comment: Reliant on BUE supoprt on RW with need of assistance of 2 people                           ADL either performed or assessed with clinical judgement   ADL Overall ADL's : Needs assistance/impaired Eating/Feeding: Set up;Sitting   Grooming: Sitting;Minimal assistance   Upper Body Bathing: Minimal assistance;Sitting;Bed level   Lower Body Bathing: Maximal assistance;Sitting/lateral leans;Bed level   Upper Body Dressing : Minimal assistance;Sitting   Lower Body Dressing: Total assistance;Bed level     Toilet Transfer Details (indicate cue type  and reason): NT. Performed stand pivot from recliner to EOB: See Mobility section. Toileting- Clothing Manipulation and Hygiene: Maximal assistance;Sitting/lateral lean       Functional mobility during ADLs: Moderate assistance;Maximal assistance;+2 for physical  assistance;Cueing for sequencing;Rolling walker;Cueing for safety       Vision   Vision Assessment?: No apparent visual deficits     Perception     Praxis      Pertinent Vitals/Pain Pain Assessment: Faces Pain Score: 3  Faces Pain Scale: Hurts even more Pain Location: Abdominal, across waist. Appears distended at stomach and pt denied ability to have a BM in "days". Pain Descriptors / Indicators: Aching Pain Intervention(s): Limited activity within patient's tolerance;Monitored during session;Repositioned;Patient requesting pain meds-RN notified     Hand Dominance     Extremity/Trunk Assessment Upper Extremity Assessment Upper Extremity Assessment: Generalized weakness   Lower Extremity Assessment Lower Extremity Assessment: Generalized weakness LLE Deficits / Details: 2+/5 strength at hip with AAROM at hip to 90 flex and 20 abd   Cervical / Trunk Assessment Cervical / Trunk Assessment: Kyphotic Cervical / Trunk Exceptions: Very thin and kyphotic   Communication Communication Communication: Expressive difficulties;Receptive difficulties (Word finding difficulties and decreased ability to understand all instructions. Receptive difficulties vs underlying dementia.)   Cognition Arousal/Alertness: Awake/alert Behavior During Therapy: Agitated;Anxious;Flat affect Overall Cognitive Status: Impaired/Different from baseline Area of Impairment: Following commands;Safety/judgement;Awareness;Problem solving;Attention;Memory                   Current Attention Level: Focused Memory: Decreased short-term memory Following Commands: Follows one step commands consistently;Follows multi-step commands inconsistently Safety/Judgement: Decreased awareness of safety   Problem Solving: Slow processing;Difficulty sequencing;Requires verbal cues;Requires tactile cues General Comments: Pt displayed some increasing agitation while OT was collecting PLOF information from the sister, so  was unable to gather further information regarding cognitive baseline.   General Comments       Exercises Exercises: Total Joint Total Joint Exercises Ankle Circles/Pumps: AROM;Both;15 reps;Supine Quad Sets: AROM;Both;10 reps;Supine Heel Slides: AAROM;Left;20 reps;Supine Hip ABduction/ADduction: AAROM;Left;15 reps;Supine   Shoulder Instructions      Home Living Family/patient expects to be discharged to:: Skilled nursing facility Living Arrangements: Other relatives Available Help at Discharge: Family;Available PRN/intermittently Type of Home: House Home Access: Stairs to enter [employer] of Steps: 2 Entrance Stairs-Rails: None Home Layout: One level     Bathroom Shower/Tub: Tub/shower unit (Pt's sister reports that pt has not entered her shower at home is "years" possibly due to fear of falling.)   Bathroom Toilet: Handicapped height     Home Equipment: Environmental consultant - 2 wheels   Additional Comments: Information above is sister's house where she will be staying at discharge      Prior Functioning/Environment Level of Independence: Independent        Comments: Pt normally lives in [place] with spouse, child, and grandchild and is completely independent but does not drive. Does report she get fatigued with community ambulation. Also, states she holds onto furniture/walls at times.  Reports 1 fall going up steps, otherwise no other falls.        OT Problem List: Decreased strength;Pain;Impaired balance (sitting and/or standing);Decreased knowledge of precautions;Decreased knowledge of use of DME or AE;Decreased safety awareness;Decreased activity tolerance;Decreased cognition      OT Treatment/Interventions: Self-care/ADL training;Therapeutic exercise;Therapeutic activities;Cognitive remediation/compensation;Energy conservation;DME and/or AE instruction;Patient/family education;Balance training    OT Goals(Current goals can be found in the care plan section) Acute  Rehab OT Goals Patient Stated Goal: return home to sister's house  OT Goal Formulation: With patient Time For Goal Achievement: 12/07/20 Potential to Achieve Goals: Fair ADL Goals Pt Will Perform Grooming: standing;with supervision Pt Will Perform Upper Body Dressing: with set-up;sitting Pt Will Perform Lower Body Dressing: with adaptive equipment;with set-up;sit to/from stand;sitting/lateral leans Pt Will Transfer to Toilet: with modified independence;ambulating Pt Will Perform Toileting - Clothing Manipulation and hygiene: with modified independence Additional ADL Goal #1: Pt will demonstrate improved mentation by consistently following 1 step commands and 75% of 2-steps commands in order to increase safety at home.  OT Frequency: Min 2X/week   Barriers to D/C:    Home is in [place].       Co-evaluation              AM-PAC OT "6 Clicks" Daily Activity     Outcome Measure Help from another person eating meals?: None Help from another person taking care of personal grooming?: A Little Help from another person toileting, which includes using toliet, bedpan, or urinal?: A Lot Help from another person bathing (including washing, rinsing, drying)?: A Lot Help from another person to put on and taking off regular upper body clothing?: A Little Help from another person to put on and taking off regular lower body clothing?: Total 6 Click Score: 15   End of Session Equipment Utilized During Treatment: Rolling walker Nurse Communication: Mobility status  Activity Tolerance: Patient limited by pain;Treatment limited secondary to agitation Patient left: in bed;with call bell/phone within reach;with bed alarm set;with family/visitor present  OT Visit Diagnosis: Unsteadiness on feet (R26.81);Other symptoms and signs involving cognitive function;Muscle weakness (generalized) (M62.81);History of falling (Z91.81);Other abnormalities of gait and mobility (R26.89);Pain Pain - part of body:   (Abdominal)                Time: 9480-1655 OT Time Calculation (min): 22 min Charges:  OT General Charges $OT Visit: 1 Visit OT Evaluation $OT Eval Low Complexity: [place], [place] Office: [phone] 11/23/2020  Julien Girt 11/23/2020, 3:37 PM

## 2020-11-23 NOTE — Progress Notes (Signed)
PHARMACIST - PHYSICIAN COMMUNICATION    CONCERNING: IV to Oral Route Change Policy  RECOMMENDATION: This patient is receiving thiamine by the intravenous route.  Based on criteria approved by the Pharmacy and Therapeutics Committee, the intravenous medication(s) is/are being converted to the equivalent oral dose form(s).   DESCRIPTION: These criteria include:  The patient is eating (either orally or via tube) and/or has been taking other orally administered medications for a least 24 hours  The patient has no evidence of active gastrointestinal bleeding or impaired GI absorption (gastrectomy, short bowel, patient on TNA or NPO).  If you have questions about this conversion, please contact the Pharmacy Department  []   [phone] )  [facility] []   [phone] )  [facility] []   [phone] )  [facility] []   [phone] )  [facility] [x]   [phone] )  [facility]   Eudelia Bunch, [facility] 11/23/2020 11:55 AM

## 2020-11-23 NOTE — Evaluation (Signed)
Physical Therapy Evaluation Patient Details Name: Mariah Bruce MRN: [id number] DOB: [birth year] Today's Date: 11/23/2020   History of Present Illness  Pt is 75 yo female with PMH including lung cancer, COPD, anxiety, depression, GERD and tobacco abuse.  Pt is s/p fall with L hip fx and now s/p L THR by anterior direct approach.  Clinical Impression  Pt admitted as above and presenting with functional mobility limitations 2* decreased L LE strength/ROM, post op pain, and limited endurance.  Pt hopes to progress to dc to sister's home but may need to consider SNF level rehab short term dependent on acute stay progress.   Follow Up Recommendations SNF;Home health PT (dependent on progress and level of assist famly can provide)    Equipment Recommendations  3in1 (PT)    Recommendations for Other Services       Precautions / Restrictions Precautions Precautions: Fall Restrictions Weight Bearing Restrictions: No Other Position/Activity Restrictions: WBAT      Mobility  Bed Mobility Overal bed mobility: Needs Assistance Bed Mobility: Supine to Sit     Supine to sit: Mod assist;+2 for physical assistance;+2 for safety/equipment     General bed mobility comments: increased time with cues for sequence and use of R LE to self assist.  Physical assist to manage R LE, to control trunk and to complete transition to EOB sitting with use of bed pad    Transfers Overall transfer level: Needs assistance Equipment used: Rolling walker (2 wheeled) Transfers: Sit to/from Stand Sit to Stand: Min assist;Mod assist;+2 physical assistance;+2 safety/equipment;From elevated surface         General transfer comment: cues for LE management and use of UEs to self assist.  Physical assist to bring wt up and fwd and to balance in standing  Ambulation/Gait Ambulation/Gait assistance: Min assist;+2 physical assistance;+2 safety/equipment Gait Distance (Feet): 6 Feet Assistive device: Rolling  walker (2 wheeled) Gait Pattern/deviations: Step-to pattern;Decreased step length - right;Decreased step length - left;Shuffle;Trunk flexed Gait velocity: decr   General Gait Details: cues for sequence, posture and position from RW.  Distance ltd by fatigue  Stairs            Wheelchair Mobility    Modified Rankin (Stroke Patients Only)       Balance Overall balance assessment: Needs assistance Sitting-balance support: No upper extremity supported;Feet supported Sitting balance-Leahy Scale: Fair     Standing balance support: Bilateral upper extremity supported Standing balance-Leahy Scale: Poor                               Pertinent Vitals/Pain Pain Assessment: 0-10 Pain Score: 3  Pain Location: Headache; pt denies hip pain Pain Descriptors / Indicators: Aching Pain Intervention(s): Limited activity within patient's tolerance;Monitored during session;Premedicated before session    Home Living Family/patient expects to be discharged to:: Private residence Living Arrangements: Other relatives Available Help at Discharge: Family;Available PRN/intermittently Type of Home: House Home Access: Stairs to enter Entrance Stairs-Rails: None Entrance Stairs-Number of Steps: 2 Home Layout: One level Home Equipment: Walker - 2 wheels Additional Comments: Information above is sister's house where she will be staying at discharge    Prior Function Level of Independence: Independent         Comments: Pt normally lives in [place] with spouse, child, and grandchild and is completely independent but does not drive. Does report she get fatigued with community ambulation. Also, states she holds onto furniture/walls at times.  Reports  1 fall going up steps, otherwise no other falls     Hand Dominance        Extremity/Trunk Assessment   Upper Extremity Assessment Upper Extremity Assessment: Generalized weakness    Lower Extremity Assessment Lower Extremity  Assessment: Generalized weakness;LLE deficits/detail LLE Deficits / Details: 2+/5 strength at hip with AAROM at hip to 90 flex and 20 abd    Cervical / Trunk Assessment Cervical / Trunk Assessment: Kyphotic Cervical / Trunk Exceptions: Very thin and kyphotic  Communication   Communication: No difficulties  Cognition Arousal/Alertness: Awake/alert Behavior During Therapy: WFL for tasks assessed/performed;Flat affect Overall Cognitive Status: Within Functional Limits for tasks assessed                                        General Comments      Exercises Total Joint Exercises Ankle Circles/Pumps: AROM;Both;15 reps;Supine Quad Sets: AROM;Both;10 reps;Supine Heel Slides: AAROM;Left;20 reps;Supine Hip ABduction/ADduction: AAROM;Left;15 reps;Supine   Assessment/Plan    PT Assessment Patient needs continued PT services  PT Problem List Decreased strength;Decreased range of motion;Decreased activity tolerance;Decreased balance;Decreased mobility;Decreased knowledge of use of DME;Pain       PT Treatment Interventions DME instruction;Gait training;Stair training;Functional mobility training;Therapeutic activities;Therapeutic exercise;Balance training;Patient/family education    PT Goals (Current goals can be found in the Care Plan section)  Acute Rehab PT Goals Patient Stated Goal: return home to sister's house PT Goal Formulation: With patient Time For Goal Achievement: 12/07/20 Potential to Achieve Goals: Fair    Frequency Min 6X/week   Barriers to discharge Decreased caregiver support Pt is from [facility] visiting sister    Co-evaluation               AM-PAC PT "6 Clicks" Mobility  Outcome Measure Help needed turning from your back to your side while in a flat bed without using bedrails?: A Lot Help needed moving from lying on your back to sitting on the side of a flat bed without using bedrails?: A Lot Help needed moving to and from a bed to a chair  (including a wheelchair)?: A Lot Help needed standing up from a chair using your arms (e.g., wheelchair or bedside chair)?: A Lot Help needed to walk in hospital room?: A Lot Help needed climbing 3-5 steps with a railing? : Total 6 Click Score: 11    End of Session Equipment Utilized During Treatment: Gait belt Activity Tolerance: Patient tolerated treatment well Patient left: in chair;with call bell/phone within reach;with family/visitor present Nurse Communication: Mobility status PT Visit Diagnosis: Muscle weakness (generalized) (M62.81);Difficulty in walking, not elsewhere classified (R26.2)    Time: 6433-2951 PT Time Calculation (min) (ACUTE ONLY): 38 min   Charges:     PT Treatments $Gait Training: 8-22 mins $Therapeutic Exercise: 8-22 mins        [place] Pager [phone] Office [phone]   Devanshi Califf 11/23/2020, 12:54 PM

## 2020-11-23 NOTE — TOC Initial Note (Addendum)
Transition of Care [facility]) - Initial/Assessment Note    Patient Details  Name: Mariah Bruce MRN: [id number] Date of Birth: 1946-09-13  Transition of [facility]) CM/SW Contact:    Lia Hopping, [place] Phone Number: 11/23/2020, 3:49 PM  Clinical Narrative:                 CSW met with the patient and her sister at bedside to discuss patient disposition. Patient alert x3. Patient has been visiting with her sister from [place], [place]. Patient admitted last week for UTI and went home and had a fall on the 1/18. Sister reports the patient has been very weak and refuses to use her walker.  The patient wishes return with her sister and have physical therapy come out to home. Patient sister cannot provide 24/7 to the patient due to her own health issues and is fearful the patient will fall again. CSW introduced the idea of going to rehab. Patient declined to discuss because she wants to go home.  Patient sister tearful at the idea of taking the patient home.  CSW offered the patient and sister time to discuss a plan.   Sister agreeable to initiate SNF process.    TOC staff will continue to discuss disposition SNF vs. Home.   Expected Discharge Plan: Skilled Nursing Facility Barriers to Discharge: Continued Medical Work up   Patient Goals and CMS Choice   CMS Medicare.gov Compare Post Acute Care list provided to:: Patient Choice offered to / list presented to : Patient  Expected Discharge Plan and Services Expected Discharge Plan: [place] In-house Referral: Clinical Social Work Discharge Planning Services: CM Consult Post Acute Care Choice: [place] Living arrangements for the past 2 months: Single Family Home Expected Discharge Date:  (unknown)                                    Prior Living Arrangements/Services Living arrangements for the past 2 months: Single Family Home Lives with:: 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 Retail buyer) Patient language and need  for interpreter reviewed:: No Do you feel safe going back to the place where you live?: Yes      Need for Family Participation in Patient Care: Yes (Comment) Care giver support system in place?: Yes (comment) Current home services: DME Criminal Activity/Legal Involvement Pertinent to Current Situation/Hospitalization: No - Comment as needed  Activities of Daily Living Home Assistive Devices/Equipment: Walker (specify type),Other (Comment) (front wheeled walker, handicap height toilet seat, walk-in shower) ADL Screening (condition at time of admission) Patient's cognitive ability adequate to safely complete daily activities?: Yes Is the patient deaf or have difficulty hearing?: No Does the patient have difficulty seeing, even when wearing glasses/contacts?: No Does the patient have difficulty concentrating, remembering, or making decisions?: No Patient able to express need for assistance with ADLs?: Yes Does the patient have difficulty dressing or bathing?: Yes Independently performs ADLs?: No Communication: Independent Dressing (OT): Needs assistance Is this a change from baseline?: Change from baseline, expected to last >3 days Grooming: Needs assistance Is this a change from baseline?: Change from baseline, expected to last >3 days Feeding: Needs assistance Is this a change from baseline?: Change from baseline, expected to last >3 days Bathing: Needs assistance Is this a change from baseline?: Change from baseline, expected to last >3 days Toileting: Dependent Is this a change from baseline?: Change from baseline, expected to last >3days In/Out Bed: Dependent Is this  a change from baseline?: Change from baseline, expected to last >3 days Walks in Home: Dependent Is this a change from baseline?: Change from baseline, expected to last >3 days Does the patient have difficulty walking or climbing stairs?: Yes (secondary to left femur fracture) Weakness of Legs: Left Weakness of  Arms/Hands: None  Permission Sought/Granted Permission sought to share information with : Facility Art therapist granted to share information with : Yes, Verbal Permission Granted  Share Information with NAME: ESTRIDGE,DENISE Sister  Permission granted to share info w AGENCY: SNF's in the [place] area  Permission granted to share info w Relationship: Sister  Permission granted to share info w Contact Information: [phone]  Emotional Assessment Appearance:: Appears stated age Attitude/Demeanor/Rapport: Engaged Affect (typically observed): Overwhelmed Orientation: : Oriented to Self,Oriented to Place,Oriented to Situation Alcohol / Substance Use: Not Applicable Psych Involvement: No (comment)  Admission diagnosis:  Lower urinary tract infectious disease [N39.0] Delirium [R41.0] Hypoxia [R09.02] Closed left hip fracture (HCC) [S72.002A] Closed fracture of left hip, initial encounter ([place]) [S72.002A] Closed displaced fracture of left femoral neck ([place]) [S72.002A] Patient Active Problem List   Diagnosis Date Noted  . Closed left hip fracture ([place]) 11/21/2020  . Hypokalemia 11/21/2020  . Abnormal ECG 11/21/2020  . COPD (chronic obstructive pulmonary disease) ([place]) 11/21/2020  . Abnormal EKG 11/21/2020  . Protein-calorie malnutrition, severe 11/15/2020  . Hematuria 11/13/2020  . Urinary tract infection with hematuria 11/13/2020  . Acute kidney injury ([place]) 11/13/2020  . Tobacco abuse 11/13/2020  . Lung cancer ([place]) 11/13/2020  . Underweight 11/13/2020  . Acute metabolic encephalopathy 75/42/3702   PCP:  Girtha Rm, NP-C Pharmacy:   [id number] [place] [place] [address] [id number]: [phone]: [phone]    Social Determinants of Health (SDOH) Interventions    Readmission Risk Interventions No flowsheet data found.

## 2020-11-23 NOTE — Progress Notes (Signed)
Physical Therapy Treatment Patient Details Name: Mariah Bruce MRN: [id number] DOB: January 09, 1946 Today's Date: 11/23/2020    History of Present Illness Pt is 75 yo female with PMH including lung cancer, COPD, anxiety, depression, GERD and tobacco abuse.  Pt is s/p fall with L hip fx and now s/p L THR by anterior direct approach.    PT Comments    Pt cooperative but anxious and with limited endurance and requiring significant assist for safe performance of all mobility tasks.  Pt is resistant to follow up rehab at SNF level but pt's sister states she can not provide level of assist pt currently requires.  Follow Up Recommendations  SNF     Equipment Recommendations  3in1 (PT)    Recommendations for Other Services       Precautions / Restrictions Precautions Precautions: Fall Restrictions Weight Bearing Restrictions: No Other Position/Activity Restrictions: WBAT    Mobility  Bed Mobility Overal bed mobility: Needs Assistance Bed Mobility: Sit to Supine     Supine to sit: Max assist;+2 for physical assistance Sit to supine: Max assist;+2 for physical assistance;+2 for safety/equipment   General bed mobility comments: increased time with cues for sequence and use of R LE to self assist.  Physical assist to manage Bil LE, to control trunk and to complete transition to supine with use of bed pad  Transfers Overall transfer level: Needs assistance Equipment used: Rolling walker (2 wheeled) Transfers: Sit to/from Stand Sit to Stand: Mod assist;+2 physical assistance         General transfer comment: increased time with cues for sequence and need of multimodal cues to place hands on arms of recliner to pull upper body anteriorly to prepare for stand as pt unable to follow verbal cues alone. Multimodal cues for UE reach back to control descent to EOB.  Ambulation/Gait Ambulation/Gait assistance: Min assist;+2 physical assistance;+2 safety/equipment Gait Distance (Feet): 4  Feet Assistive device: Rolling walker (2 wheeled) Gait Pattern/deviations: Step-to pattern;Decreased step length - right;Decreased step length - left;Shuffle;Trunk flexed Gait velocity: decr   General Gait Details: cues for sequence, posture and position from RW.  Distance ltd by fatigue   Stairs             Wheelchair Mobility    Modified Rankin (Stroke Patients Only)       Balance Overall balance assessment: Needs assistance Sitting-balance support: No upper extremity supported;Feet supported Sitting balance-Leahy Scale: Fair     Standing balance support: Bilateral upper extremity supported Standing balance-Leahy Scale: Poor Standing balance comment: Reliant on BUE supoprt on RW with need of assistance of 2 people                            Cognition Arousal/Alertness: Awake/alert Behavior During Therapy: Anxious;Flat affect Overall Cognitive Status: Impaired/Different from baseline Area of Impairment: Following commands;Safety/judgement;Awareness;Problem solving;Attention;Memory                   Current Attention Level: Focused Memory: Decreased short-term memory Following Commands: Follows one step commands consistently;Follows multi-step commands inconsistently Safety/Judgement: Decreased awareness of safety   Problem Solving: Slow processing;Difficulty sequencing;Requires verbal cues;Requires tactile cues General Comments: Pt displayed some increasing agitation while OT was collecting PLOF information from the sister, so was unable to gather further information regarding cognitive baseline.      Exercises      General Comments        Pertinent Vitals/Pain Pain Assessment: Faces Faces Pain Scale:  Hurts even more Pain Location: Abdominal, across waist. Appears distended at stomach and pt denied ability to have a BM in "days". Pain Descriptors / Indicators: Guarding;Sore Pain Intervention(s): Limited activity within patient's  tolerance;Monitored during session    [place] expects to be discharged to:: Skilled nursing facility Living Arrangements: Other relatives Available Help at Discharge: Family;Available PRN/intermittently Type of Home: House Home Access: Stairs to enter Entrance Stairs-Rails: None Home Layout: One level Home Equipment: Environmental consultant - 2 wheels Additional Comments: Information above is sister's house where she will be staying at discharge    Prior Function Level of Independence: Independent      Comments: Pt normally lives in [place] with spouse, child, and grandchild and is completely independent but does not drive. Does report she get fatigued with community ambulation. Also, states she holds onto furniture/walls at times.  Reports 1 fall going up steps, otherwise no other falls.   PT Goals (current goals can now be found in the care plan section) Acute Rehab PT Goals Patient Stated Goal: return home to sister's house PT Goal Formulation: With patient Time For Goal Achievement: 12/07/20 Potential to Achieve Goals: Fair Progress towards PT goals: Progressing toward goals    Frequency    Min 5X/week      PT Plan Discharge plan needs to be updated    Co-evaluation PT/OT/SLP Co-Evaluation/Treatment: Yes Reason for Co-Treatment: For patient/therapist safety PT goals addressed during session: Mobility/safety with mobility OT goals addressed during session: ADL's and self-care      AM-PAC PT "6 Clicks" Mobility   Outcome Measure  Help needed turning from your back to your side while in a flat bed without using bedrails?: A Lot Help needed moving from lying on your back to sitting on the side of a flat bed without using bedrails?: A Lot Help needed moving to and from a bed to a chair (including a wheelchair)?: A Lot Help needed standing up from a chair using your arms (e.g., wheelchair or bedside chair)?: A Lot Help needed to walk in hospital room?: A Lot Help  needed climbing 3-5 steps with a railing? : Total 6 Click Score: 11    End of Session Equipment Utilized During Treatment: Gait belt Activity Tolerance: Patient limited by fatigue Patient left: in bed;with call bell/phone within reach;with bed alarm set;with family/visitor present Nurse Communication: Mobility status PT Visit Diagnosis: Muscle weakness (generalized) (M62.81);Difficulty in walking, not elsewhere classified (R26.2)     Time: 6389-3734 PT Time Calculation (min) (ACUTE ONLY): 23 min  Charges:  $Therapeutic Activity: 8-22 mins                     [place] Pager [phone] Office [phone]    Yomaira Solar 11/23/2020, 4:59 PM

## 2020-11-23 NOTE — NC FL2 (Signed)
[place] LEVEL OF CARE SCREENING TOOL     IDENTIFICATION  Patient Name: Mariah Bruce Birthdate: December 21, 1945 Sex: female Admission Date (Current Location): 11/20/2020  [facility] and [place] Number:  Herbalist and Address:  [facility] [place],  [address] [place], [place]      Provider Number: [id number]  Attending Physician Name and Address:  Oswald Hillock, MD  Relative Name and Phone Number:  Wilhelmenia Blase   [phone]    Current Level of Care: Hospital Recommended Level of Care: [place] Prior Approval Number:    Date Approved/Denied:   PASRR Number:    Discharge Plan: SNF    Current Diagnoses: Patient Active Problem List   Diagnosis Date Noted  . Closed left hip fracture ([place]) 11/21/2020  . Hypokalemia 11/21/2020  . Abnormal ECG 11/21/2020  . COPD (chronic obstructive pulmonary disease) ([place]) 11/21/2020  . Abnormal EKG 11/21/2020  . Protein-calorie malnutrition, severe 11/15/2020  . Hematuria 11/13/2020  . Urinary tract infection with hematuria 11/13/2020  . Acute kidney injury ([place]) 11/13/2020  . Tobacco abuse 11/13/2020  . Lung cancer ([place]) 11/13/2020  . Underweight 11/13/2020  . Acute metabolic encephalopathy 09/32/3557    Orientation RESPIRATION BLADDER Height & Weight     Self,Situation,Place  Normal Continent Weight: 99 lb 10.4 oz (45.2 kg) Height:  6' (182.9 cm)  BEHAVIORAL SYMPTOMS/MOOD NEUROLOGICAL BOWEL NUTRITION STATUS      Continent Diet (Regular)  AMBULATORY STATUS COMMUNICATION OF NEEDS Skin   Extensive Assist   Surgical wounds                       Personal Care Assistance Level of Assistance  Bathing,Feeding,Dressing Bathing Assistance: Maximum assistance Feeding assistance: Independent Dressing Assistance: Maximum assistance     Functional Limitations Info  Sight,Hearing,Speech Sight Info: Adequate Hearing Info: Adequate Speech Info: Adequate    SPECIAL  CARE FACTORS FREQUENCY  PT (By licensed PT),OT (By licensed OT)     PT Frequency: 5x/week OT Frequency: 5x/week            Contractures Contractures Info: Not present    Additional Factors Info  Code Status,Allergies,Psychotropic Code Status Info: DNR Allergies Info: Allergies: Penicillin G           Current Medications (11/23/2020):  This is the current hospital active medication list Current Facility-Administered Medications  Medication Dose Route Frequency Provider Last Rate Last Admin  . acetaminophen (TYLENOL) tablet 325-650 mg  325-650 mg Oral Q6H PRN Swinteck, Aaron Edelman, MD      . albuterol (VENTOLIN HFA) 108 (90 Base) MCG/ACT inhaler 2 puff  2 puff Inhalation Q4H PRN Swinteck, Aaron Edelman, MD      . apixaban Arne Cleveland) tablet 2.5 mg  2.5 mg Oral BID Rod Can, MD   2.5 mg at 11/23/20 0910  . bisacodyl (DULCOLAX) suppository 10 mg  10 mg Rectal Once Oswald Hillock, MD      . Chlorhexidine Gluconate Cloth 2 % PADS 6 each  6 each Topical Daily Swinteck, Aaron Edelman, MD      . docusate sodium (COLACE) capsule 100 mg  100 mg Oral BID Rod Can, MD   100 mg at 11/23/20 0910  . hydrALAZINE (APRESOLINE) injection 10 mg  10 mg Intravenous Q4H PRN Rod Can, MD   10 mg at 11/21/20 1113  . HYDROcodone-acetaminophen (NORCO) 7.5-325 MG per tablet 1-2 tablet  1-2 tablet Oral Q4H PRN Rod Can, MD      . HYDROcodone-acetaminophen (  NORCO/VICODIN) 5-325 MG per tablet 1-2 tablet  1-2 tablet Oral Q4H PRN Rod Can, MD   1 tablet at 11/23/20 0910  . LORazepam (ATIVAN) tablet 0.5 mg  0.5 mg Oral Q6H PRN Rod Can, MD   0.5 mg at 11/22/20 2027   Or  . LORazepam (ATIVAN) injection 0.5 mg  0.5 mg Intravenous Q6H PRN Rod Can, MD   0.5 mg at 11/21/20 0308  . menthol-cetylpyridinium (CEPACOL) lozenge 3 mg  1 lozenge Oral PRN Swinteck, Aaron Edelman, MD       Or  . phenol (CHLORASEPTIC) mouth spray 1 spray  1 spray Mouth/Throat PRN Swinteck, Aaron Edelman, MD      . methocarbamol  (ROBAXIN) tablet 500 mg  500 mg Oral Q6H PRN Rod Can, MD   500 mg at 11/22/20 1623   Or  . methocarbamol (ROBAXIN) 500 mg in dextrose 5 % 50 mL IVPB  500 mg Intravenous Q6H PRN Swinteck, Aaron Edelman, MD      . metoCLOPramide (REGLAN) tablet 5-10 mg  5-10 mg Oral Q8H PRN Swinteck, Aaron Edelman, MD       Or  . metoCLOPramide (REGLAN) injection 5-10 mg  5-10 mg Intravenous Q8H PRN Swinteck, Aaron Edelman, MD      . morphine 2 MG/ML injection 0.5-1 mg  0.5-1 mg Intravenous Q2H PRN Swinteck, Aaron Edelman, MD      . multivitamin with minerals tablet 1 tablet  1 tablet Oral Daily Rod Can, MD   1 tablet at 11/23/20 0910  . mupirocin ointment (BACTROBAN) 2 % 1 application  1 application Nasal BID Rod Can, MD   1 application at 48/18/56 0926  . nicotine (NICODERM CQ - dosed in mg/24 hours) patch 21 mg  21 mg Transdermal Daily Rod Can, MD   21 mg at 11/23/20 0911  . ondansetron (ZOFRAN) tablet 4 mg  4 mg Oral Q6H PRN Swinteck, Aaron Edelman, MD       Or  . ondansetron (ZOFRAN) injection 4 mg  4 mg Intravenous Q6H PRN Swinteck, Aaron Edelman, MD      . protein supplement (RESOURCE BENEPROTEIN) powder packet 6 g  1 Scoop Oral TID WC Clayton Bibles, RD   6 g at 11/22/20 1623  . senna (SENOKOT) tablet 8.6 mg  1 tablet Oral BID Rod Can, MD   8.6 mg at 11/23/20 0910  . [START ON 11/24/2020] thiamine tablet 100 mg  100 mg Oral Daily Eudelia Bunch, RPH      . venlafaxine XR (EFFEXOR-XR) 24 hr capsule 112.5 mg  112.5 mg Oral Q breakfast Rod Can, MD   112.5 mg at 11/23/20 0908  . Vitamin D (Ergocalciferol) (DRISDOL) capsule 50,000 Units  50,000 Units Oral Q7 days Oswald Hillock, MD   50,000 Units at 11/22/20 1623     Discharge Medications: Please see discharge summary for a list of discharge medications.  Relevant Imaging Results:  Relevant Lab Results:   Additional Information SSN: [id number]  Lia Hopping, LCSW

## 2020-11-23 NOTE — Progress Notes (Signed)
Md paged concerning pt constipation. Rn requesting medication for this need with page on [place].

## 2020-11-23 NOTE — Plan of Care (Signed)
  Problem: Activity: Goal: Ability to ambulate and perform ADLs will improve Outcome: Progressing   Problem: Clinical Measurements: Goal: Postoperative complications will be avoided or minimized Outcome: Progressing   Problem: Pain Management: Goal: Pain level will decrease Outcome: Progressing

## 2020-11-23 NOTE — Progress Notes (Signed)
Dr. Darrick Meigs notified of pt not voiding since foley cath d/c. Bladder scan showed 54cc in bladder. md to obtain bmp to check kidney function.

## 2020-11-23 NOTE — Progress Notes (Signed)
Rn called md concerning pt lack of urine and ivf will be started for this pt per md order.

## 2020-11-23 NOTE — Consult Note (Signed)
Consultation Note Date: 11/23/2020   Patient Name: Mariah Bruce  DOB: 1946-06-04  MRN: [id number]  Age / Sex: 75 y.o., female  PCP: Girtha Rm, NP-C Referring Physician: Oswald Hillock, MD  Reason for Consultation: Establishing goals of care  HPI/Patient Profile: 75 y.o. female    admitted on 11/20/2020    Clinical Assessment and Goals of Care:  75 yo lady who is in the hospital with L hip pain after a fall, found to have comminuted impacted nondisplaced transcervical left femoral neck fracture. On chart review, patient lives in [place], her sister lives in [place], [place]. Sister currently at bedside. Patient has a past medical history significant for CKD stage II, lung cancer, COPD, anxiety, depression, GERD, tobacco use. She underwent left total hip arthroplasty by Dr. Lyla Glassing.  Also found to have vitamin D deficiency with vitamin D level of 11, cardiac echo showed possible mass on the tricuspid annulus.  Cardiac MRI recommended.   Palliative consult for goals of care.   Patient has just participated in PT, her sister is at bedside, she complains of constipation, she feels tired. I introduced myself and palliative care as follows: Palliative medicine is specialized medical care for people living with serious illness. It focuses on providing relief from the symptoms and stress of a serious illness. The goal is to improve quality of life for both the patient and the family.  Goals of care: Broad aims of medical therapy in relation to the patient's values and preferences. Our aim is to provide medical care aimed at enabling patients to achieve the goals that matter most to them, given the circumstances of their particular medical situation and their constraints.   See below.    NEXT OF KIN  sister  SUMMARY OF RECOMMENDATIONS    Agree with DNR Recommend SNF rehab with palliative Pain control and  bowel regimen to continue.  Thank you for the consult.   Code Status/Advance Care Planning:  DNR    Symptom Management:    as above.   Palliative Prophylaxis:   Delirium Protocol   Psycho-social/Spiritual:   Desire for further Chaplaincy support:yes  Additional Recommendations: Caregiving  Support/Resources  Prognosis:   Unable to determine  Discharge Planning: [place] for rehab with Palliative care service follow-up      Primary Diagnoses: Present on Admission: . Closed left hip fracture ([place]) . Tobacco abuse . Protein-calorie malnutrition, severe . Lung cancer ([place]) . Hematuria . Acute metabolic encephalopathy . Hypokalemia . Abnormal ECG . COPD (chronic obstructive pulmonary disease) ([place]) . Abnormal EKG   I have reviewed the medical record, interviewed the patient and family, and examined the patient. The following aspects are pertinent.  Past Medical History:  Diagnosis Date  . Anxiety   . Chronic pain   . COPD (chronic obstructive pulmonary disease) ([place])   . Depression   . GERD (gastroesophageal reflux disease)   . Lung cancer [facility])    Social History   Socioeconomic History  . Marital status: Married  Spouse name: Not on file  . Number of children: Not on file  . Years of education: Not on file  . Highest education level: Not on file  Occupational History  . Not on file  Tobacco Use  . Smoking status: Current Every Day Smoker    Packs/day: 1.00    Years: 55.00    Pack years: 55.00    Types: Cigarettes  . Smokeless tobacco: Never Used  Substance and Sexual Activity  . Alcohol use: Yes    Comment: 2 beers a night  . Drug use: Never  . Sexual activity: Not on file  Other Topics Concern  . Not on file  Social History Narrative  . Not on file   Social Determinants of Health   Financial Resource Strain: Not on file  Food Insecurity: Not on file  Transportation Needs: Not on file  Physical Activity: Not on file   Stress: Not on file  Social Connections: Not on file   Family History  Problem Relation Age of Onset  . Hypertension Other    Scheduled Meds: . apixaban  2.5 mg Oral BID  . Chlorhexidine Gluconate Cloth  6 each Topical Daily  . docusate sodium  100 mg Oral BID  . multivitamin with minerals  1 tablet Oral Daily  . mupirocin ointment  1 application Nasal BID  . nicotine  21 mg Transdermal Daily  . protein supplement  1 Scoop Oral TID WC  . senna  1 tablet Oral BID  . [START ON 11/24/2020] thiamine  100 mg Oral Daily  . venlafaxine XR  112.5 mg Oral Q breakfast  . Vitamin D (Ergocalciferol)  50,000 Units Oral Q7 days   Continuous Infusions: . methocarbamol (ROBAXIN) IV     PRN Meds:.acetaminophen, albuterol, hydrALAZINE, HYDROcodone-acetaminophen, HYDROcodone-acetaminophen, LORazepam **OR** LORazepam, menthol-cetylpyridinium **OR** phenol, methocarbamol **OR** methocarbamol (ROBAXIN) IV, metoCLOPramide **OR** metoCLOPramide (REGLAN) injection, morphine injection, ondansetron **OR** ondansetron (ZOFRAN) IV Medications Prior to Admission:  Prior to Admission medications   Medication Sig Start Date End Date Taking? Authorizing Provider  amitriptyline (ELAVIL) 10 MG tablet Take 1 tablet (10 mg total) by mouth at bedtime. 11/15/20 12/15/20 Yes Arrien, Jimmy Picket, MD  citalopram (CELEXA) 20 MG tablet Take 1.5 tablets (30 mg total) by mouth daily. Taking 1 & 1/2 tab daily = 30 mg 11/15/20 12/15/20 Yes Arrien, Jimmy Picket, MD  famotidine (PEPCID) 40 MG tablet Take 1 tablet (40 mg total) by mouth daily for 30 doses. 11/15/20 12/15/20 Yes Arrien, Jimmy Picket, MD  gabapentin (NEURONTIN) 300 MG capsule Take 1-2 capsules (300-600 mg total) by mouth See admin instructions. Taking 300 mg in the Am and 600 mg at bedtime 11/15/20  Yes Arrien, Jimmy Picket, MD  ibuprofen (ADVIL) 200 MG tablet Take 400 mg by mouth every 6 (six) hours as needed for mild pain.   Yes [provider]   LORazepam (ATIVAN) 0.5 MG tablet Take 0.5-1 mg by mouth every 4 (four) hours as needed for anxiety (Shortness of breath).   Yes [provider]  oxyCODONE-acetaminophen (PERCOCET/ROXICET) 5-325 MG tablet Take 2 tablets by mouth every 4 (four) hours as needed for moderate pain.   Yes [provider]  senna-docusate (SENOKOT-S) 8.6-50 MG tablet Take 2 tablets by mouth 2 (two) times daily.   Yes [provider]  venlafaxine XR (EFFEXOR-XR) 37.5 MG 24 hr capsule Take 1 capsule (37.5 mg total) by mouth daily. Taking with 75 mg daily = 112.5 mg 11/15/20 12/15/20 Yes Arrien, Jimmy Picket, MD  venlafaxine XR (EFFEXOR-XR) 75 MG 24 hr capsule Take 1 capsule (75 mg total) by mouth daily with breakfast. Taking with 37.5 mg daily = 112.5 mg 11/15/20 12/15/20 Yes Arrien, Jimmy Picket, MD   Allergies  Allergen Reactions  . Penicillin G Rash    Patient tolerated ANCEF 2 grams on 11/22/20   Review of Systems Complains of constipation.   Physical Exam Feels tired after recent PT Mild abdominal distension Regular work of breathing Recent LLE surgery Awake alert oriented.    Vital Signs: BP 125/73 (BP Location: Right Arm)   Pulse 92   Temp (!) 97.4 F (36.3 C) (Oral)   Resp 16   Ht 6' (1.829 m)   Wt 45.2 kg   SpO2 98%   BMI 13.51 kg/m  Pain Scale: 0-10 POSS *See Group Information*: 1-Acceptable,Awake and alert Pain Score: 3    SpO2: SpO2: 98 % O2 Device:SpO2: 98 % O2 Flow Rate: .O2 Flow Rate (L/min): 2 L/min  IO: Intake/output summary:   Intake/Output Summary (Last 24 hours) at 11/23/2020 1505 Last data filed at 11/23/2020 1300 Gross per 24 hour  Intake 404.02 ml  Output 1300 ml  Net -895.98 ml    LBM: Last BM Date: 11/21/20 Baseline Weight: Weight: 45.2 kg Most recent weight: Weight: 45.2 kg     Palliative Assessment/Data:   PPS 50%  Time In:  1400 Time Out:  1500 Time Total:  60  Greater than 50%  of this time was spent counseling and  coordinating care related to the above assessment and plan.  Signed by: Loistine Chance, MD   Please contact Palliative Medicine Team phone at [phone] for questions and concerns.  For individual provider: See Shea Evans

## 2020-11-23 NOTE — Progress Notes (Signed)
Subjective: 1 Day Post-Op Procedure(s) (LRB): ANTERIOR APPROACH HIP ARTHROPLASTY (Left) Patient reports pain as mild.  No hip pain, is complaining of HA Tolerating PO with decreased appetite. No N/V. +foley -flatus, -BM Denies fevers, sweats, chills.   Objective: Vital signs in last 24 hours: Temp:  [97.4 F (36.3 C)-98.1 F (36.7 C)] 97.7 F (36.5 C) (01/22 0907) Pulse Rate:  [73-108] 79 (01/22 0907) Resp:  [11-23] 18 (01/22 0907) BP: (111-169)/(59-87) 111/61 (01/22 0907) SpO2:  [95 %-100 %] 100 % (01/22 0907)  Intake/Output from previous day: 01/21 0701 - 01/22 0700 In: 2054 [I.V.:1400; IV Piggyback:654] Out: 2050 [Urine:1850; Blood:200] Intake/Output this shift: No intake/output data recorded.  Recent Labs    11/20/20 2334 11/21/20 0444 11/22/20 0553 11/23/20 0232  HGB 13.5 11.8* 12.0 10.0*   Recent Labs    11/22/20 0553 11/23/20 0232  WBC 8.4 9.9  RBC 3.67* 3.02*  HCT 36.1 30.4*  PLT 124* 138*   Recent Labs    11/22/20 0553 11/23/20 0232  NA 134* 135  K 3.0* 4.4  CL 97* 100  CO2 27 29  BUN 14 20  CREATININE 0.74 0.84  GLUCOSE 86 123*  CALCIUM 8.3* 8.3*   Recent Labs    11/21/20 0145  INR 1.1    Neurologically intact ABD soft Neurovascular intact Sensation intact distally Intact pulses distally Dorsiflexion/Plantar flexion intact Incision: dressing C/D/I No cellulitis present Compartment soft Homan's negative bilaterally.   Assessment/Plan: 1 Day Post-Op Procedure(s) (LRB): ANTERIOR APPROACH HIP ARTHROPLASTY (Left) Advance diet Up with therapy WBAT LLE w/ walker DVT ppx: apixaban OOB with PT/OT Dispo per primary team  F/U as out-patient with Dr. Lyla Glassing in 2 weeks     Yvonne Kendall Ward 11/23/2020, 9:36 AM

## 2020-11-24 LAB — BASIC METABOLIC PANEL
Anion gap: 8 (ref 5–15)
BUN: 26 mg/dL — ABNORMAL HIGH (ref 8–23)
CO2: 24 mmol/L (ref 22–32)
Calcium: 8 mg/dL — ABNORMAL LOW (ref 8.9–10.3)
Chloride: 102 mmol/L (ref 98–111)
Creatinine, Ser: 0.94 mg/dL (ref 0.44–1.00)
GFR, Estimated: >60 mL/min (ref >60)
Glucose, Bld: 85 mg/dL (ref 70–99)
Potassium: 3.4 mmol/L — ABNORMAL LOW (ref 3.5–5.1)
Sodium: 134 mmol/L — ABNORMAL LOW (ref 135–145)

## 2020-11-24 LAB — CBC
HCT: 27.6 % — ABNORMAL LOW (ref 36.0–46.0)
Hemoglobin: 9 g/dL — ABNORMAL LOW (ref 12.0–15.0)
MCH: 32.8 pg (ref 26.0–34.0)
MCHC: 32.6 g/dL (ref 30.0–36.0)
MCV: 100.7 fL — ABNORMAL HIGH (ref 80.0–100.0)
Platelets: 141 10*3/uL — ABNORMAL LOW (ref 150–400)
RBC: 2.74 MIL/uL — ABNORMAL LOW (ref 3.87–5.11)
RDW: 14.2 % (ref 11.5–15.5)
WBC: 6.8 10*3/uL (ref 4.0–10.5)
nRBC: 0 % (ref 0.0–0.2)

## 2020-11-24 MED ORDER — AMLODIPINE BESYLATE 5 MG PO TABS
5.0000 mg | ORAL_TABLET | Freq: Every day | ORAL | Status: DC
  Administered 2020-11-24 – 2020-11-27 (×4): 5 mg via ORAL
  Filled 2020-11-24 (×4): qty 1

## 2020-11-24 MED ORDER — POTASSIUM CHLORIDE 10 MEQ/100ML IV SOLN
10.0000 meq | Freq: Once | INTRAVENOUS | Status: AC
  Administered 2020-11-24: 10 meq via INTRAVENOUS
  Filled 2020-11-24: qty 100

## 2020-11-24 NOTE — Progress Notes (Signed)
Subjective: 2 Days Post-Op Procedure(s) (LRB): ANTERIOR APPROACH HIP ARTHROPLASTY (Left) Patient reports pain as mild.    Objective: Vital signs in last 24 hours: Temp:  [97.4 F (36.3 C)-97.9 F (36.6 C)] 97.9 F (36.6 C) (01/23 0502) Pulse Rate:  [79-93] 80 (01/23 0502) Resp:  [16-18] 18 (01/23 0502) BP: (111-153)/(61-76) 153/76 (01/23 0502) SpO2:  [94 %-100 %] 94 % (01/23 0502)  Intake/Output from previous day: 01/22 0701 - 01/23 0700 In: 1839.1 [P.O.:740; I.V.:1099.1] Out: -  Intake/Output this shift: Total I/O In: -  Out: 550 [Urine:550]  Recent Labs    11/22/20 0553 11/23/20 0232 11/24/20 0319  HGB 12.0 10.0* 9.0*   Recent Labs    11/23/20 0232 11/24/20 0319  WBC 9.9 6.8  RBC 3.02* 2.74*  HCT 30.4* 27.6*  PLT 138* 141*   Recent Labs    11/23/20 1634 11/24/20 0319  NA 135 134*  K 3.7 3.4*  CL 101 102  CO2 23 24  BUN 27* 26*  CREATININE 1.14* 0.94  GLUCOSE 128* 85  CALCIUM 8.6* 8.0*   No results for input(s): LABPT, INR in the last 72 hours.  Neurovascular intact Dorsiflexion/Plantar flexion intact No cellulitis present Compartment soft   Assessment/Plan: 2 Days Post-Op Procedure(s) (LRB): ANTERIOR APPROACH HIP ARTHROPLASTY (Left) Advance diet Up with therapy   Mariah Bruce 11/24/2020, 8:08 AM

## 2020-11-24 NOTE — Evaluation (Signed)
Clinical/Bedside Swallow Evaluation Patient Details  Name: Kemiyah Tarazon MRN: [id number] Date of Birth: [birth year]  Today's Date: 11/24/2020 Time: SLP Start Time (ACUTE ONLY): 8921 SLP Stop Time (ACUTE ONLY): 1400 SLP Time Calculation (min) (ACUTE ONLY): 15 min  Past Medical History:  Past Medical History:  Diagnosis Date  . Anxiety   . Chronic pain   . COPD (chronic obstructive pulmonary disease) ([place])   . Depression   . GERD (gastroesophageal reflux disease)   . Lung cancer [facility])    Past Surgical History:  Past Surgical History:  Procedure Laterality Date  . BACK SURGERY    . HERNIA REPAIR    . TONSILLECTOMY     HPI:  Pt is 75 yo female with PMH including lung cancer, COPD, anxiety, depression, GERD and tobacco abuse.  Pt is s/p fall with L hip fx and now s/p L THR by anterior direct approach.   Assessment / Plan / Recommendation Clinical Impression  Patient presents with a functional oropharyngeal swallow with normal oral mastication and transit of bolus, timely appearing swallow initiation, and no overt indication of aspiration. No SLP f/u indicated at this time. SLP Visit Diagnosis: Dysphagia, unspecified (R13.10)    Aspiration Risk  No limitations    Diet Recommendation Regular;Thin liquid   Liquid Administration via: Cup;Straw Medication Administration: Whole meds with liquid Supervision: Patient able to self feed Postural Changes: Seated upright at 90 degrees    Other  Recommendations Oral Care Recommendations: Oral care BID   Follow up Recommendations None        Swallow Study   General HPI: Pt is 75 yo female with PMH including lung cancer, COPD, anxiety, depression, GERD and tobacco abuse.  Pt is s/p fall with L hip fx and now s/p L THR by anterior direct approach. Type of Study: Bedside Swallow Evaluation Previous Swallow Assessment: none Diet Prior to this Study: Regular;Thin liquids Temperature Spikes Noted: No Respiratory Status: Room  air History of Recent Intubation: No Behavior/Cognition: Alert;Cooperative;Pleasant mood Oral Cavity Assessment: Within Functional Limits Oral Care Completed by SLP: Recent completion by staff Oral Cavity - Dentition: Adequate natural dentition Vision: Functional for self-feeding Self-Feeding Abilities: Able to feed self Patient Positioning: Upright in bed Baseline Vocal Quality: Normal Volitional Cough: Strong Volitional Swallow: Able to elicit    Oral/Motor/Sensory Function Overall Oral Motor/Sensory Function: Within functional limits   Ice Chips Ice chips: Not tested   Thin Liquid Thin Liquid: Within functional limits Presentation: Straw;Self Fed    Nectar Thick Nectar Thick Liquid: Not tested   Honey Thick Honey Thick Liquid: Not tested   Puree Puree: Within functional limits Presentation: Spoon;Self Fed   Solid     Solid: Within functional limits Presentation: Self Fed     [employer] MA, CCC-SLP   Duane Trias Meryl 11/24/2020,2:05 PM

## 2020-11-24 NOTE — Progress Notes (Signed)
Physical Therapy Treatment Patient Details Name: Mariah Bruce MRN: [id number] DOB: 1946-01-11 Today's Date: 11/24/2020    History of Present Illness Pt is 75 yo female with PMH including lung cancer, COPD, anxiety, depression, GERD and tobacco abuse.  Pt is s/p fall with L hip fx and now s/p L THR by anterior direct approach.    PT Comments    Pt very cooperative with therex program and up to ambulate limited distance with assist but limited by onset urinary incontinence and returned to sitting EOB for hygiene and change of socks.  In sitting, pt c/o SOB and needing to ly down.  Pt assisted to supine - SaO2 95% with BP 158/68 - RN awaren.   Follow Up Recommendations  SNF     Equipment Recommendations  3in1 (PT)    Recommendations for Other Services       Precautions / Restrictions Precautions Precautions: Fall Precaution Comments: Incontinent or urine with standing Restrictions Weight Bearing Restrictions: No Other Position/Activity Restrictions: WBAT    Mobility  Bed Mobility Overal bed mobility: Needs Assistance Bed Mobility: Supine to Sit;Sit to Supine     Supine to sit: Min assist;Mod assist;+2 for physical assistance;+2 for safety/equipment Sit to supine: Mod assist;Max assist;+2 for physical assistance;+2 for safety/equipment   General bed mobility comments: increased time with cues for sequence and use of R LE to self assist.  Physical assist to manage Bil LE, to control trunk and to complete transition with use of bed pad  Transfers Overall transfer level: Needs assistance Equipment used: Rolling walker (2 wheeled) Transfers: Sit to/from Stand Sit to Stand: Min assist;+2 physical assistance;From elevated surface;+2 safety/equipment         General transfer comment: cues for LE management and use of UEs to self assist.  Physical assist to bring wt up and fwd and to balance in initial standing  Ambulation/Gait Ambulation/Gait assistance: Min assist;+2  physical assistance;+2 safety/equipment Gait Distance (Feet): 3 Feet Assistive device: Rolling walker (2 wheeled) Gait Pattern/deviations: Step-to pattern;Decreased step length - right;Decreased step length - left;Shuffle;Trunk flexed Gait velocity: decr   General Gait Details: cues for sequence, posture and position from RW.  Distance ltd by onset urinary incontinence   Stairs             Wheelchair Mobility    Modified Rankin (Stroke Patients Only)       Balance Overall balance assessment: Needs assistance Sitting-balance support: No upper extremity supported;Feet supported Sitting balance-Leahy Scale: Fair     Standing balance support: Bilateral upper extremity supported Standing balance-Leahy Scale: Poor Standing balance comment: Reliant on BUE supoprt on RW with need of assistance of 2 people                            Cognition Arousal/Alertness: Awake/alert Behavior During Therapy: Flat affect;WFL for tasks assessed/performed Overall Cognitive Status: Impaired/Different from baseline Area of Impairment: Following commands;Safety/judgement;Awareness;Problem solving;Attention;Memory                     Memory: Decreased short-term memory Following Commands: Follows one step commands consistently;Follows multi-step commands inconsistently Safety/Judgement: Decreased awareness of safety   Problem Solving: Slow processing;Difficulty sequencing;Requires verbal cues;Requires tactile cues        Exercises Total Joint Exercises Ankle Circles/Pumps: AROM;Both;15 reps;Supine Quad Sets: AROM;Both;10 reps;Supine Heel Slides: AAROM;Left;20 reps;Supine Hip ABduction/ADduction: AAROM;Left;15 reps;Supine    General Comments        Pertinent Vitals/Pain Pain Assessment: Faces  Faces Pain Scale: Hurts a little bit Pain Location: L hip Pain Descriptors / Indicators: Discomfort Pain Intervention(s): Limited activity within patient's  tolerance;Monitored during session;Premedicated before session    Home Living                      Prior Function            PT Goals (current goals can now be found in the care plan section) Acute Rehab PT Goals Patient Stated Goal: return home to sister's house PT Goal Formulation: With patient Time For Goal Achievement: 12/07/20 Potential to Achieve Goals: Fair Progress towards PT goals: Progressing toward goals    Frequency    Min 3X/week      PT Plan Current plan remains appropriate    Co-evaluation              AM-PAC PT "6 Clicks" Mobility   Outcome Measure  Help needed turning from your back to your side while in a flat bed without using bedrails?: A Lot Help needed moving from lying on your back to sitting on the side of a flat bed without using bedrails?: A Lot Help needed moving to and from a bed to a chair (including a wheelchair)?: A Lot Help needed standing up from a chair using your arms (e.g., wheelchair or bedside chair)?: A Lot Help needed to walk in hospital room?: A Lot Help needed climbing 3-5 steps with a railing? : Total 6 Click Score: 11    End of Session Equipment Utilized During Treatment: Gait belt Activity Tolerance: Patient limited by fatigue Patient left: in bed;with call bell/phone within reach;with bed alarm set;with family/visitor present Nurse Communication: Mobility status PT Visit Diagnosis: Muscle weakness (generalized) (M62.81);Difficulty in walking, not elsewhere classified (R26.2)     Time: 6433-2951 PT Time Calculation (min) (ACUTE ONLY): 32 min  Charges:  $Gait Training: 8-22 mins $Therapeutic Exercise: 8-22 mins                     [place] Pager [phone] Office [phone]    Traeger Sultana 11/24/2020, 11:48 AM

## 2020-11-24 NOTE — Plan of Care (Signed)
  Problem: Clinical Measurements: Goal: Ability to maintain clinical measurements within normal limits will improve Outcome: Progressing   

## 2020-11-24 NOTE — Progress Notes (Signed)
[facility]  PROGRESS NOTE  Mariah Bruce JAS:[id number] DOB: [birth year] DOA: 11/20/2020 PCP: Girtha Rm, NP-C   Brief HPI:   75 year old female who presented with nondisplaced left femoral neck fracture, underwent total left hip arthroplasty. Found to have low vitamin D level, started on vitamin D supplementation. Cardiac echo showed possible mass on tricuspid annulus, discussed with patient and her sister they both did not want further work-up for cardiac mass. Plan to go to skilled nursing facility however patient is resistant to going to facility.    Subjective   Patient seen and examined, PT recommends patient to go to skilled nursing home facility. But patient does not want to go to skilled facility. Patient sister says she cannot provide the care at home.   Assessment/Plan:     1. Nondisplaced left femoral neck fracture-patient presented with nondisplaced left femoral neck fracture, she was seen by orthopedic surgeon, underwent left total hip arthroplasty. Ortho recommends weightbearing as tolerated to left lower extremity with a walker. PT recommends going to skilled facility for rehab however patient does not want to go to rehab and wants to go home. 2. Vitamin D deficiency-patient's vitamin D level was found to be 11.54. Started on 50,000 units vitamin D q. 7 days for 4 weeks. Recommend to check vitamin D level in 4 to 6 weeks. 3. Hypertension-blood pressures controlled, continue as needed hydralazine. Patient does not take any medication at home, will start amlodipine 5 mg p.o. daily. 4. History of lung cancer-patient declined further work-up in the past. 5. Hypokalemia-potassium was 3.4 today despite replacement. Will give additional KCl 10 mEq IV x1. Follow BMP in am. 6. Mild AKI-patient's BUN/creatinine was mildly elevated yesterday, started on IV normal saline at 100 mL/h. Today BUN/creatinine is 26/0.94. 7. Abnormality on echocardiogram-echocardiogram showed no  regional wall motion abnormality however showed a normal structure on the tricuspid annulus. Discussed with patient and her sister at bedside the decline further work-up by cardiac MRI at this time.      COVID-19 Labs  No results for input(s): DDIMER, FERRITIN, LDH, CRP in the last 72 hours.  Lab Results  Component Value Date   [place] NEGATIVE 11/21/2020   [place] NEGATIVE 11/13/2020     Scheduled medications:   . apixaban  2.5 mg Oral BID  . Chlorhexidine Gluconate Cloth  6 each Topical Daily  . docusate sodium  100 mg Oral BID  . multivitamin with minerals  1 tablet Oral Daily  . mupirocin ointment  1 application Nasal BID  . nicotine  21 mg Transdermal Daily  . protein supplement  1 Scoop Oral TID WC  . senna  1 tablet Oral BID  . thiamine  100 mg Oral Daily  . venlafaxine XR  112.5 mg Oral Q breakfast  . Vitamin D (Ergocalciferol)  50,000 Units Oral Q7 days         CBG: No results for input(s): GLUCAP in the last 168 hours.  SpO2: 94 % O2 Flow Rate (L/min): 2 L/min    CBC: Recent Labs  Lab 11/20/20 2334 11/21/20 0444 11/22/20 0553 11/23/20 0232 11/24/20 0319  WBC 15.6* 11.9* 8.4 9.9 6.8  NEUTROABS 13.7*  --   --   --   --   HGB 13.5 11.8* 12.0 10.0* 9.0*  HCT 41.1 35.9* 36.1 30.4* 27.6*  MCV 99.0 98.4 98.4 100.7* 100.7*  PLT 151 134* 124* 138* 141*    Basic Metabolic Panel: Recent Labs  Lab 11/21/20 0145 11/21/20 0444 11/22/20 0553  11/23/20 0232 11/23/20 1634 11/24/20 0319  NA  --  136 134* 135 135 134*  K  --  3.5 3.0* 4.4 3.7 3.4*  CL  --  102 97* 100 101 102  CO2  --  26 27 29 23 24   GLUCOSE  --  107* 86 123* 128* 85  BUN  --  24* 14 20 27* 26*  CREATININE  --  0.70 0.74 0.84 1.14* 0.94  CALCIUM  --  8.3* 8.3* 8.3* 8.6* 8.0*  MG 1.3*  --   --   --   --   --   PHOS 2.2*  --   --   --   --   --      Liver Function Tests: Recent Labs  Lab 11/21/20 0145 11/21/20 0444 11/22/20 0553  AST 27 23 21   ALT 29 25 23   ALKPHOS  103 88 82  BILITOT 1.0 0.9 0.9  PROT 6.1* 5.3* 5.8*  ALBUMIN 3.5 3.0* 3.3*     Antibiotics: Anti-infectives (From admission, onward)   Start     Dose/Rate Route Frequency Ordered Stop   11/22/20 1600  ceFAZolin (ANCEF) IVPB 2g/100 mL premix        2 g 200 mL/hr over 30 Minutes Intravenous Every 6 hours 11/22/20 1418 11/23/20 0010   11/22/20 0830  ceFAZolin (ANCEF) IVPB 2g/100 mL premix        2 g 200 mL/hr over 30 Minutes Intravenous On call to O.R. 11/22/20 0635 11/22/20 1017       DVT prophylaxis: Eliquis  Code Status: Full code  Family Communication: No family at bedside   Consultants:  Orthopedics  Procedures:      Objective   Vitals:   11/23/20 1240 11/23/20 2221 11/24/20 0502 11/24/20 1319  BP: 125/73 130/61 (!) 153/76 133/64  Pulse: 92 93 80 84  Resp: 16 18 18 16   Temp: (!) 97.4 F (36.3 C) 97.9 F (36.6 C) 97.9 F (36.6 C) 97.9 F (36.6 C)  TempSrc: Oral Oral Oral Oral  SpO2: 98% 95% 94% 94%  Weight:      Height:        Intake/Output Summary (Last 24 hours) at 11/24/2020 1345 Last data filed at 11/24/2020 1012 Gross per 24 hour  Intake 1759.09 ml  Output 550 ml  Net 1209.09 ml    01/21 1901 - 01/23 0700 In: 1839.1 [P.O.:740; I.V.:1099.1] Out: 300 [Urine:300]  Filed Weights   11/21/20 1600 11/22/20 0816  Weight: 45.2 kg 45.2 kg    Physical Examination:   General-appears in no acute distress Heart-S1-S2, regular, no murmur auscultated Lungs-clear to auscultation bilaterally, no wheezing or crackles auscultated Abdomen-soft, nontender, no organomegaly Extremities-no edema in the lower extremities Neuro-alert, oriented x3, no focal deficit noted  Status is: Inpatient  Dispo: The patient is from: Home              Anticipated d/c is to: Skilled nursing facility              Anticipated d/c date is: 11/25/2020              Patient currently medically stable for discharge  Barrier to discharge-awaiting bed at skilled nursing  facility      Data Reviewed:   Recent Results (from the past 240 hour(s))  Urine culture     Status: None   Collection Time: 11/20/20 11:50 PM   Specimen: Urine, Clean Catch  Result Value Ref Range Status   Specimen Description  Final    URINE, CLEAN CATCH Performed at [facility], [place] [address] [place], [place] [address]    Special Requests   Final    NONE Performed at [facility], [place] [address], [place], [place] [address]    Culture   Final    NO GROWTH Performed at [facility], [place] [address], [place], [place] [address]    Report Status 11/22/2020 FINAL  Final  SARS CORONAVIRUS 2 (TAT 6-24 HRS) Nasopharyngeal Nasopharyngeal Swab     Status: None   Collection Time: 11/21/20 12:16 AM   Specimen: Nasopharyngeal Swab  Result Value Ref Range Status   SARS Coronavirus 2 NEGATIVE NEGATIVE Final    Comment: (NOTE) SARS-CoV-2 target nucleic acids are NOT DETECTED.  The SARS-CoV-2 RNA is generally detectable in upper and lower respiratory specimens during the acute phase of infection. Negative results do not preclude SARS-CoV-2 infection, do not rule out co-infections with other pathogens, and should not be used as the sole basis for treatment or other patient management decisions. Negative results must be combined with clinical observations, patient history, and epidemiological information. The expected result is Negative.  Fact Sheet for Patients: SugarRoll.be  Fact Sheet for Healthcare Providers: https://www.woods-mathews.com/  This test is not yet approved or cleared by the [place] FDA and  has been authorized for detection and/or diagnosis of SARS-CoV-2 by FDA under an Emergency Use Authorization (EUA). This EUA will remain  in effect (meaning this test can be used) for the duration of the COVID-19 declaration under Se ction 564(b)(1) of the Act, 21 U.S.C. section  360bbb-3(b)(1), unless the authorization is terminated or revoked sooner.  Performed at [facility], [place] [address] [place], [place] [address]   Surgical PCR screen     Status: None   Collection Time: 11/22/20  5:43 AM   Specimen: Nasal Mucosa; Nasal Swab  Result Value Ref Range Status   MRSA, PCR NEGATIVE NEGATIVE Final   Staphylococcus aureus NEGATIVE NEGATIVE Final    Comment: (NOTE) The Xpert SA Assay (FDA approved for NASAL specimens in patients 55 years of age and older), is one component of a comprehensive surveillance program. It is not intended to diagnose infection nor to guide or monitor treatment. Performed at [facility], [place] [address], [place], [place] [address]     No results for input(s): LIPASE, AMYLASE in the last 168 hours. Recent Labs  Lab 11/21/20 0145  AMMONIA 15    Cardiac Enzymes: Recent Labs  Lab 11/21/20 0145  [place]   [facility] If 7PM-7AM, please contact night-coverage at [email or web address], Office  [phone]   11/24/2020, 1:45 PM  LOS: 3 days

## 2020-11-25 ENCOUNTER — Encounter (HOSPITAL_COMMUNITY): Payer: Self-pay | Admitting: Orthopedic Surgery

## 2020-11-25 LAB — CBC
HCT: 31.4 % — ABNORMAL LOW (ref 36.0–46.0)
Hemoglobin: 10.5 g/dL — ABNORMAL LOW (ref 12.0–15.0)
MCH: 33.1 pg (ref 26.0–34.0)
MCHC: 33.4 g/dL (ref 30.0–36.0)
MCV: 99.1 fL (ref 80.0–100.0)
Platelets: 162 10*3/uL (ref 150–400)
RBC: 3.17 MIL/uL — ABNORMAL LOW (ref 3.87–5.11)
RDW: 14 % (ref 11.5–15.5)
WBC: 6.3 10*3/uL (ref 4.0–10.5)
nRBC: 0 % (ref 0.0–0.2)

## 2020-11-25 LAB — BASIC METABOLIC PANEL
Anion gap: 10 (ref 5–15)
BUN: 12 mg/dL (ref 8–23)
CO2: 26 mmol/L (ref 22–32)
Calcium: 8.8 mg/dL — ABNORMAL LOW (ref 8.9–10.3)
Chloride: 100 mmol/L (ref 98–111)
Creatinine, Ser: 0.6 mg/dL (ref 0.44–1.00)
GFR, Estimated: >60 mL/min (ref >60)
Glucose, Bld: 101 mg/dL — ABNORMAL HIGH (ref 70–99)
Potassium: 3.1 mmol/L — ABNORMAL LOW (ref 3.5–5.1)
Sodium: 136 mmol/L (ref 135–145)

## 2020-11-25 MED ORDER — POTASSIUM CHLORIDE 10 MEQ/100ML IV SOLN
10.0000 meq | INTRAVENOUS | Status: AC
Start: 2020-11-25 — End: 2020-11-25
  Administered 2020-11-25 (×3): 10 meq via INTRAVENOUS
  Filled 2020-11-25 (×2): qty 100

## 2020-11-25 MED ORDER — MAGNESIUM SULFATE 4 GM/100ML IV SOLN
4.0000 g | Freq: Once | INTRAVENOUS | Status: AC
  Administered 2020-11-25: 4 g via INTRAVENOUS
  Filled 2020-11-25: qty 100

## 2020-11-25 NOTE — Progress Notes (Signed)
Physical Therapy Treatment Patient Details Name: Mariah Bruce MRN: [id number] DOB: 1946-03-25 Today's Date: 11/25/2020    History of Present Illness Pt is 75 yo female with PMH including lung cancer, COPD, anxiety, depression, GERD and tobacco abuse.  Pt is s/p fall with L hip fx and now s/p L THR by anterior direct approach.    PT Comments    Pt adamantly refused getting out of bed. She agreed to ankle pumps and quad set exercises in bed but refused further exercises for LLE. Pt denied pain. Benefits of mobility and risks of bedrest were explained to pt, she continued to refuse mobility. When asked why, pt stated, "I don't want to". She stated her plan is to have her husband drive her home to [place].    Follow Up Recommendations  SNF     Equipment Recommendations  3in1 (PT)    Recommendations for Other Services       Precautions / Restrictions Precautions Precautions: Fall Precaution Comments: Incontinent or urine with standing Restrictions Other Position/Activity Restrictions: WBAT    Mobility  Bed Mobility               General bed mobility comments: pt adamantly refused  Transfers                    Ambulation/Gait                 Stairs             Wheelchair Mobility    Modified Rankin (Stroke Patients Only)       Balance                                            Cognition Arousal/Alertness: Awake/alert Behavior During Therapy: Flat affect;WFL for tasks assessed/performed Overall Cognitive Status: Impaired/Different from baseline Area of Impairment: Safety/judgement;Awareness;Problem solving                       Following Commands: Follows one step commands consistently     Problem Solving: Slow processing;Difficulty sequencing;Requires verbal cues;Requires tactile cues General Comments: Pt is "firm in her decisions" at baseline per pt's sister. Pt adamantly refused to get out of bed,  and refused to perform any exercises involving movment of L hip, despite stating she was not in pain. Educated pt in risks of bed rest, she continued to refuse mobility. Pt stated she wants to have her husband drive her home to [place].      Exercises Total Joint Exercises Ankle Circles/Pumps: AROM;Both;15 reps;Supine Quad Sets: AROM;Both;Supine;5 reps    General Comments        Pertinent Vitals/Pain Pain Assessment: No/denies pain    Home Living                      Prior Function            PT Goals (current goals can now be found in the care plan section) Acute Rehab PT Goals Patient Stated Goal: pt wants to go to her home in [place] PT Goal Formulation: With patient/family Time For Goal Achievement: 12/07/20 Potential to Achieve Goals: Fair Progress towards PT goals: Not progressing toward goals - comment    Frequency    Min 3X/week      PT Plan Current plan remains appropriate    Co-evaluation  AM-PAC PT "6 Clicks" Mobility   Outcome Measure  Help needed turning from your back to your side while in a flat bed without using bedrails?: A Lot Help needed moving from lying on your back to sitting on the side of a flat bed without using bedrails?: A Lot Help needed moving to and from a bed to a chair (including a wheelchair)?: A Lot Help needed standing up from a chair using your arms (e.g., wheelchair or bedside chair)?: A Lot Help needed to walk in hospital room?: A Lot Help needed climbing 3-5 steps with a railing? : Total 6 Click Score: 11    End of Session   Activity Tolerance: Treatment limited secondary to agitation Patient left: in bed;with call bell/phone within reach;with bed alarm set;with family/visitor present Nurse Communication: Mobility status PT Visit Diagnosis: Muscle weakness (generalized) (M62.81);Difficulty in walking, not elsewhere classified (R26.2)     Time: 1443-1540 PT Time Calculation (min) (ACUTE  ONLY): 12 min  Charges:  $Therapeutic Exercise: 8-22 mins                     Blondell Reveal Kistler PT 11/25/2020  Acute Rehabilitation Services Pager [phone] Office [phone]

## 2020-11-25 NOTE — Care Management Important Message (Signed)
Medicare important message printed for Nicole Sinclair, LCSW to give to the patient. 

## 2020-11-25 NOTE — Progress Notes (Signed)
[facility]  PROGRESS NOTE  Mariah Bruce DXA:[id number] DOB: 02-Jun-1946 DOA: 11/20/2020 PCP: Girtha Rm, NP-C   Brief HPI:   75 year old female who presented with nondisplaced left femoral neck fracture, underwent total left hip arthroplasty. Found to have low vitamin D level, started on vitamin D supplementation. Cardiac echo showed possible mass on tricuspid annulus, discussed with patient and her sister they both did not want further work-up for cardiac mass. Plan to go to skilled nursing facility however patient is resistant to going to facility.    Subjective   Patient seen and examined, denies any complaints.  PT recommends skilled facility however patient continues to refuse to go to skilled facility.   Assessment/Plan:     1. Nondisplaced left femoral neck fracture-patient presented with nondisplaced left femoral neck fracture, she was seen by orthopedic surgeon, underwent left total hip arthroplasty. Ortho recommends weightbearing as tolerated to left lower extremity with a walker. PT recommends going to skilled facility for rehab however patient does not want to go to rehab and wants to go home.  Discussed with social work, she is going to discuss with patient's sister, whether she can take care of patient at home. 2. Vitamin D deficiency-patient's vitamin D level was found to be 11.54. Started on 50,000 units vitamin D q. 7 days for 4 weeks. Recommend to check vitamin D level in 4 to 6 weeks. 3. Hypertension-blood pressures controlled, continue as needed hydralazine. Patient does not take any medication at home, will start amlodipine 5 mg p.o. daily. 4. History of lung cancer-patient declined further work-up in the past. 5. Hypokalemia-potassium is 3.1 this morning.  Will replace potassium and check BMP in am. 6. Hypomagnesemia-magnesium 1.3, will replace magnesium with mag sulfate 4 g IV x1.  Repeat magnesium level in a.m. 7. Mild AKI-patient's BUN/creatinine was  mildly elevated yesterday, started on IV normal saline at 100 mL/h. Today BUN/creatinine is 26/0.94. 8. Abnormality on echocardiogram-echocardiogram showed no regional wall motion abnormality however showed a normal structure on the tricuspid annulus. Discussed with patient and her sister at bedside the decline further work-up by cardiac MRI at this time.      COVID-19 Labs  No results for input(s): DDIMER, FERRITIN, LDH, CRP in the last 72 hours.  Lab Results  Component Value Date   [place] NEGATIVE 11/21/2020   [place] NEGATIVE 11/13/2020     Scheduled medications:   . amLODipine  5 mg Oral Daily  . apixaban  2.5 mg Oral BID  . Chlorhexidine Gluconate Cloth  6 each Topical Daily  . docusate sodium  100 mg Oral BID  . multivitamin with minerals  1 tablet Oral Daily  . mupirocin ointment  1 application Nasal BID  . nicotine  21 mg Transdermal Daily  . protein supplement  1 Scoop Oral TID WC  . senna  1 tablet Oral BID  . thiamine  100 mg Oral Daily  . venlafaxine XR  112.5 mg Oral Q breakfast  . Vitamin D (Ergocalciferol)  50,000 Units Oral Q7 days         CBG: No results for input(s): GLUCAP in the last 168 hours.  SpO2: 95 % O2 Flow Rate (L/min): 2 L/min    CBC: Recent Labs  Lab 11/20/20 2334 11/21/20 0444 11/22/20 0553 11/23/20 0232 11/24/20 0319 11/25/20 0252  WBC 15.6* 11.9* 8.4 9.9 6.8 6.3  NEUTROABS 13.7*  --   --   --   --   --   HGB 13.5 11.8* 12.0  10.0* 9.0* 10.5*  HCT 41.1 35.9* 36.1 30.4* 27.6* 31.4*  MCV 99.0 98.4 98.4 100.7* 100.7* 99.1  PLT 151 134* 124* 138* 141* 956    Basic Metabolic Panel: Recent Labs  Lab 11/21/20 0145 11/21/20 0444 11/22/20 0553 11/23/20 0232 11/23/20 1634 11/24/20 0319 11/25/20 0252  NA  --    < > 134* 135 135 134* 136  K  --    < > 3.0* 4.4 3.7 3.4* 3.1*  CL  --    < > 97* 100 101 102 100  CO2  --    < > 27 29 23 24 26   GLUCOSE  --    < > 86 123* 128* 85 101*  BUN  --    < > 14 20 27* 26* 12   CREATININE  --    < > 0.74 0.84 1.14* 0.94 0.60  CALCIUM  --    < > 8.3* 8.3* 8.6* 8.0* 8.8*  MG 1.3*  --   --   --   --   --   --   PHOS 2.2*  --   --   --   --   --   --    < > = values in this interval not displayed.     Liver Function Tests: Recent Labs  Lab 11/21/20 0145 11/21/20 0444 11/22/20 0553  AST 27 23 21   ALT 29 25 23   ALKPHOS 103 88 82  BILITOT 1.0 0.9 0.9  PROT 6.1* 5.3* 5.8*  ALBUMIN 3.5 3.0* 3.3*     Antibiotics: Anti-infectives (From admission, onward)   Start     Dose/Rate Route Frequency Ordered Stop   11/22/20 1600  ceFAZolin (ANCEF) IVPB 2g/100 mL premix        2 g 200 mL/hr over 30 Minutes Intravenous Every 6 hours 11/22/20 1418 11/23/20 0010   11/22/20 0830  ceFAZolin (ANCEF) IVPB 2g/100 mL premix        2 g 200 mL/hr over 30 Minutes Intravenous On call to O.R. 11/22/20 0635 11/22/20 1017       DVT prophylaxis: Eliquis  Code Status: Full code  Family Communication: No family at bedside   Consultants:  Orthopedics  Procedures:      Objective   Vitals:   11/24/20 0502 11/24/20 1319 11/24/20 2117 11/25/20 0534  BP: (!) 153/76 133/64 (!) 148/78 (!) 161/88  Pulse: 80 84 78 81  Resp: 18 16 15 15   Temp: 97.9 F (36.6 C) 97.9 F (36.6 C) 98 F (36.7 C) 98.2 F (36.8 C)  TempSrc: Oral Oral    SpO2: 94% 94% 94% 95%  Weight:      Height:        Intake/Output Summary (Last 24 hours) at 11/25/2020 1137 Last data filed at 11/25/2020 0900 Gross per 24 hour  Intake 2438.01 ml  Output 2350 ml  Net 88.01 ml    01/22 1901 - 01/24 0700 In: 3837.1 [P.O.:800; I.V.:2937.1] Out: 2700 [Urine:2700]  Filed Weights   11/21/20 1600 11/22/20 0816  Weight: 45.2 kg 45.2 kg    Physical Examination:  General-appears in no acute distress Heart-S1-S2, regular, no murmur auscultated Lungs-clear to auscultation bilaterally, no wheezing or crackles auscultated Abdomen-soft, nontender, no organomegaly Extremities-no edema in the lower  extremities Neuro-alert, oriented x3, no focal deficit noted  Status is: Inpatient  Dispo: The patient is from: Home              Anticipated d/c is to: Skilled nursing facility  Anticipated d/c date is: 11/26/2020              Patient currently medically stable for discharge  Barrier to discharge-awaiting bed at skilled nursing facility      Data Reviewed:   Recent Results (from the past 240 hour(s))  Urine culture     Status: None   Collection Time: 11/20/20 11:50 PM   Specimen: Urine, Clean Catch  Result Value Ref Range Status   Specimen Description   Final    URINE, CLEAN CATCH Performed at [facility] [address] [place], [place] [address]    Special Requests   Final    NONE Performed at [facility], [place] [address], [place], [place] [address]    Culture   Final    NO GROWTH Performed at [facility], [place] [address] [place], [place] [address]    Report Status 11/22/2020 FINAL  Final  SARS CORONAVIRUS 2 (TAT 6-24 HRS) Nasopharyngeal Nasopharyngeal Swab     Status: None   Collection Time: 11/21/20 12:16 AM   Specimen: Nasopharyngeal Swab  Result Value Ref Range Status   SARS Coronavirus 2 NEGATIVE NEGATIVE Final    Comment: (NOTE) SARS-CoV-2 target nucleic acids are NOT DETECTED.  The SARS-CoV-2 RNA is generally detectable in upper and lower respiratory specimens during the acute phase of infection. Negative results do not preclude SARS-CoV-2 infection, do not rule out co-infections with other pathogens, and should not be used as the sole basis for treatment or other patient management decisions. Negative results must be combined with clinical observations, patient history, and epidemiological information. The expected result is Negative.  Fact Sheet for Patients: SugarRoll.be  Fact Sheet for Healthcare Providers: https://www.woods-mathews.com/  This test  is not yet approved or cleared by the [place] FDA and  has been authorized for detection and/or diagnosis of SARS-CoV-2 by FDA under an Emergency Use Authorization (EUA). This EUA will remain  in effect (meaning this test can be used) for the duration of the COVID-19 declaration under Se ction 564(b)(1) of the Act, 21 U.S.C. section 360bbb-3(b)(1), unless the authorization is terminated or revoked sooner.  Performed at [facility], [place] [address], [place], [place] [address]   Surgical PCR screen     Status: None   Collection Time: 11/22/20  5:43 AM   Specimen: Nasal Mucosa; Nasal Swab  Result Value Ref Range Status   MRSA, PCR NEGATIVE NEGATIVE Final   Staphylococcus aureus NEGATIVE NEGATIVE Final    Comment: (NOTE) The Xpert SA Assay (FDA approved for NASAL specimens in patients 88 years of age and older), is one component of a comprehensive surveillance program. It is not intended to diagnose infection nor to guide or monitor treatment. Performed at [facility], [place] [address] [place], [place] [address]     No results for input(s): LIPASE, AMYLASE in the last 168 hours. Recent Labs  Lab 11/21/20 0145  AMMONIA 15    Cardiac Enzymes: Recent Labs  Lab 11/21/20 0145  [place]   [facility] If 7PM-7AM, please contact night-coverage at [email or web address], Office  [phone]   11/25/2020, 11:37 AM  LOS: 4 days

## 2020-11-25 NOTE — TOC Progression Note (Signed)
Transition of Care [facility]) - Progression Note    Patient Details  Name: Mariah Bruce MRN: [id number] Date of Birth: 02/07/1946  Transition of Care [facility]) CM/SW [place], LCSW Phone Number: 11/25/2020, 3:47 PM  Clinical Narrative:    CSW followed up with the patient at bedside. Patient sister has left for the day. Patient is waiting to discuss d/c plan with her spouse this evening. CSW will follow up in the am.   Expected Discharge Plan: [place] Barriers to Discharge: Continued Medical Work up  Expected Discharge Plan and Services Expected Discharge Plan: [place] In-house Referral: Clinical Social Work Discharge Planning Services: CM Consult Post Acute Care Choice: [place] Living arrangements for the past 2 months: Single Family Home Expected Discharge Date:  (unknown)                                     Social Determinants of Health (SDOH) Interventions    Readmission Risk Interventions No flowsheet data found.

## 2020-11-25 NOTE — Progress Notes (Signed)
[facility] ([facility])   [facility] received call from patient's spouse asking about possibility of hospice services in the area, reporting pt had been on either hospice or palliative services in [place].   Please call with any questions or concerns.  Thank you for the opportunity to participate in this pt's care.  Domenic Moras, BSN, RN [employer] [phone] (24h on call)

## 2020-11-25 NOTE — TOC Progression Note (Addendum)
Transition of Care [facility]) - Progression Note    Patient Details  Name: Mariah Bruce MRN: [id number] Date of Birth: October 14, 1946  Transition of Care [facility]) CM/SW [place], LCSW Phone Number: 11/25/2020, 1:53 PM  Clinical Narrative:    CSW met the patient and her sister Mariah Bruce to follow up with discharge plan. Patient reports, " my husband will pick me up and take me back to [place], I do not want to go to rehab. If my sister will allow me to stay with her for a few days I can get myself in the car." Mariah Bruce informed the patient she cannot care for at her home.   CSW explained the process and notified them of the  SNF bed offer.  CSW asked to talk asked the patient to talk with her spouse. Patient says her sister has been in contact with spouse and there is no need to call him.   Patient to talk with her spouse about plan of care.  CSW will follow up later today.    Expected Discharge Plan: [place] Barriers to Discharge: Continued Medical Work up  Expected Discharge Plan and Services Expected Discharge Plan: [place] In-house Referral: Clinical Social Work Discharge Planning Services: CM Consult Post Acute Care Choice: [place] arrangements for the past 2 months: Single Family Home Expected Discharge Date:  (unknown)                                     Social Determinants of Health (SDOH) Interventions    Readmission Risk Interventions No flowsheet data found.

## 2020-11-25 NOTE — Progress Notes (Signed)
Medical decision making capacity evaluation   Capacity evaluation is needed  in following situation/conditions;   Patient refusing to go to rehab after hip surgery    Capacity evaluation :  1. Patient is alert, oriented to time, place and person.  She knows that she is in the hospital for hip fracture which occurred after a fall.  She knows that she underwent surgery for the hip fracture and physical therapy has recommended patient to go to skilled nursing facility.       2.   Patient understands that going to rehab for physical therapy will make her stronger, however she refuses to go to rehab and wants to go home.  Patient lives in [place] and wants to go back to [place].  She says that her husband will come and take her to [place] in their SUV.      3.   Patient shows reasoning in making this decision.  Patient is aware that there are risks involved in refusing to go to rehab, she says that she may fall again and may be bedbound for rest of her life.  Patient says that she will take her chances.        Assessment - patient has shown reasoning and sound judgment in making her decision, she does have medical decision-making capacity.

## 2020-11-26 LAB — MAGNESIUM: Magnesium: 2.2 mg/dL (ref 1.7–2.4)

## 2020-11-26 LAB — BASIC METABOLIC PANEL
Anion gap: 9 (ref 5–15)
BUN: 11 mg/dL (ref 8–23)
CO2: 21 mmol/L — ABNORMAL LOW (ref 22–32)
Calcium: 8.3 mg/dL — ABNORMAL LOW (ref 8.9–10.3)
Chloride: 101 mmol/L (ref 98–111)
Creatinine, Ser: 0.6 mg/dL (ref 0.44–1.00)
GFR, Estimated: >60 mL/min (ref >60)
Glucose, Bld: 103 mg/dL — ABNORMAL HIGH (ref 70–99)
Potassium: 3.5 mmol/L (ref 3.5–5.1)
Sodium: 131 mmol/L — ABNORMAL LOW (ref 135–145)

## 2020-11-26 NOTE — Progress Notes (Signed)
PT Cancellation Note  Patient Details Name: Mariah Bruce MRN: [id number] DOB: Jun 13, 1946   Cancelled Treatment:    Reason Eval/Treat Not Completed: Patient declined, no reason specified (attempted PT for a second time. Pt had stated to hospitalist and to case manager that she was willing to participate with PT. Pt again refused all mobility. Risks of bed rest were again explained. Pt continued to  adamantly refuse getting out of bed. She stated her reason is that she doesn't want to.)   Philomena Doheny PT 11/26/2020  Acute Rehabilitation Services Pager [phone] Office [phone]

## 2020-11-26 NOTE — Progress Notes (Signed)
[facility]  PROGRESS NOTE  Mariah Bruce ZOX:[id number] DOB: 17-Aug-1946 DOA: 11/20/2020 PCP: Girtha Rm, NP-C   Brief HPI:   75 year old female who presented with nondisplaced left femoral neck fracture, underwent total left hip arthroplasty. Found to have low vitamin D level, started on vitamin D supplementation. Cardiac echo showed possible mass on tricuspid annulus, discussed with patient and her sister they both did not want further work-up for cardiac mass. Plan to go to skilled nursing facility however patient is resistant to going to facility.    Subjective   Patient seen and examined, denies any complaints.   Assessment/Plan:     1. Nondisplaced left femoral neck fracture-patient presented with nondisplaced left femoral neck fracture, she was seen by orthopedic surgeon, underwent left total hip arthroplasty. Ortho recommends weightbearing as tolerated to left lower extremity with a walker. PT recommends going to skilled facility for rehab however patient does not want to go to rehab and wants to go home.  Discussed with social work, she is going to discuss with patient's sister, whether she can take care of patient at home. 2. Vitamin D deficiency-patient's vitamin D level was found to be 11.54. Started on 50,000 units vitamin D q. 7 days for 4 weeks. Recommend to check vitamin D level in 4 to 6 weeks. 3. Hypertension-blood pressures controlled, continue as needed hydralazine. Patient does not take any medication at home, will start amlodipine 5 mg p.o. daily. 4. History of lung cancer-patient declined further work-up in the past. 5. Hypokalemia-potassium is 3.1 this morning.  Will replace potassium and check BMP in am. 6. Hypomagnesemia-magnesium 1.3, will replace magnesium with mag sulfate 4 g IV x1.  Repeat magnesium level in a.m. 7. Mild AKI-patient's BUN/creatinine was mildly elevated yesterday, started on IV normal saline at 100 mL/h. Today BUN/creatinine is  26/0.94. 8. Abnormality on echocardiogram-echocardiogram showed no regional wall motion abnormality however showed a normal structure on the tricuspid annulus. Discussed with patient and her sister at bedside the decline further work-up by cardiac MRI at this time. 9. Disposition issues-patient has been refusing to go to skilled nursing facility however PT recommends her to go to rehab.  Patient's family including her sister and husband also want her to go to rehab.  Evaluation for decision-making capacity was done yesterday by myself, patient does have medical decision-making capacity.      COVID-19 Labs  No results for input(s): DDIMER, FERRITIN, LDH, CRP in the last 72 hours.  Lab Results  Component Value Date   [place] NEGATIVE 11/21/2020   [place] NEGATIVE 11/13/2020     Scheduled medications:   . amLODipine  5 mg Oral Daily  . apixaban  2.5 mg Oral BID  . Chlorhexidine Gluconate Cloth  6 each Topical Daily  . docusate sodium  100 mg Oral BID  . multivitamin with minerals  1 tablet Oral Daily  . mupirocin ointment  1 application Nasal BID  . nicotine  21 mg Transdermal Daily  . protein supplement  1 Scoop Oral TID WC  . senna  1 tablet Oral BID  . thiamine  100 mg Oral Daily  . venlafaxine XR  112.5 mg Oral Q breakfast  . Vitamin D (Ergocalciferol)  50,000 Units Oral Q7 days         CBG: No results for input(s): GLUCAP in the last 168 hours.  SpO2: 95 % O2 Flow Rate (L/min): 2 L/min    CBC: Recent Labs  Lab 11/20/20 2334 11/21/20 0444 11/22/20 0553 11/23/20  8502 11/24/20 0319 11/25/20 0252  WBC 15.6* 11.9* 8.4 9.9 6.8 6.3  NEUTROABS 13.7*  --   --   --   --   --   HGB 13.5 11.8* 12.0 10.0* 9.0* 10.5*  HCT 41.1 35.9* 36.1 30.4* 27.6* 31.4*  MCV 99.0 98.4 98.4 100.7* 100.7* 99.1  PLT 151 134* 124* 138* 141* 774    Basic Metabolic Panel: Recent Labs  Lab 11/21/20 0145 11/21/20 0444 11/23/20 0232 11/23/20 1634 11/24/20 0319 11/25/20 0252  11/26/20 0332  NA  --    < > 135 135 134* 136 131*  K  --    < > 4.4 3.7 3.4* 3.1* 3.5  CL  --    < > 100 101 102 100 101  CO2  --    < > 29 23 24 26  21*  GLUCOSE  --    < > 123* 128* 85 101* 103*  BUN  --    < > 20 27* 26* 12 11  CREATININE  --    < > 0.84 1.14* 0.94 0.60 0.60  CALCIUM  --    < > 8.3* 8.6* 8.0* 8.8* 8.3*  MG 1.3*  --   --   --   --   --  2.2  PHOS 2.2*  --   --   --   --   --   --    < > = values in this interval not displayed.     Liver Function Tests: Recent Labs  Lab 11/21/20 0145 11/21/20 0444 11/22/20 0553  AST 27 23 21   ALT 29 25 23   ALKPHOS 103 88 82  BILITOT 1.0 0.9 0.9  PROT 6.1* 5.3* 5.8*  ALBUMIN 3.5 3.0* 3.3*     Antibiotics: Anti-infectives (From admission, onward)   Start     Dose/Rate Route Frequency Ordered Stop   11/22/20 1600  ceFAZolin (ANCEF) IVPB 2g/100 mL premix        2 g 200 mL/hr over 30 Minutes Intravenous Every 6 hours 11/22/20 1418 11/23/20 0010   11/22/20 0830  ceFAZolin (ANCEF) IVPB 2g/100 mL premix        2 g 200 mL/hr over 30 Minutes Intravenous On call to O.R. 11/22/20 0635 11/22/20 1017       DVT prophylaxis: Eliquis  Code Status: Full code  Family Communication: No family at bedside   Consultants:  Orthopedics  Procedures:      Objective   Vitals:   11/25/20 0534 11/25/20 1453 11/25/20 2058 11/26/20 0645  BP: (!) 161/88 126/62 127/72 (!) 179/94  Pulse: 81 98 94 83  Resp: 15 18 16 15   Temp: 98.2 F (36.8 C) 98.2 F (36.8 C) 97.6 F (36.4 C) 98 F (36.7 C)  TempSrc:  Oral Oral Oral  SpO2: 95% 96% 97% 95%  Weight:      Height:        Intake/Output Summary (Last 24 hours) at 11/26/2020 1352 Last data filed at 11/26/2020 1148 Gross per 24 hour  Intake 1480 ml  Output 1700 ml  Net -220 ml    01/23 1901 - 01/25 0700 In: 1287 [P.O.:1290; I.V.:1838] Out: 2500 [Urine:2500]  Filed Weights   11/21/20 1600 11/22/20 0816  Weight: 45.2 kg 45.2 kg    Physical  Examination:  General-appears in no acute distress Heart-S1-S2, regular, no murmur auscultated Lungs-clear to auscultation bilaterally, no wheezing or crackles auscultated Abdomen-soft, nontender, no organomegaly Extremities-no edema in the lower extremities Neuro-alert, oriented x3, no focal deficit  noted  Status is: Inpatient  Dispo: The patient is from: Home              Anticipated d/c is to: Skilled nursing facility              Anticipated d/c date is: 11/27/2020              Patient currently medically stable for discharge  Barrier to discharge-awaiting bed at skilled nursing facility      Data Reviewed:   Recent Results (from the past 240 hour(s))  Urine culture     Status: None   Collection Time: 11/20/20 11:50 PM   Specimen: Urine, Clean Catch  Result Value Ref Range Status   Specimen Description   Final    URINE, CLEAN CATCH Performed at [facility] [address], [place], [place] [address]    Special Requests   Final    NONE Performed at [facility], [place] [address] [place], [place] [address]    Culture   Final    NO GROWTH Performed at [facility], [place] [address] [place], [place] [address]    Report Status 11/22/2020 FINAL  Final  SARS CORONAVIRUS 2 (TAT 6-24 HRS) Nasopharyngeal Nasopharyngeal Swab     Status: None   Collection Time: 11/21/20 12:16 AM   Specimen: Nasopharyngeal Swab  Result Value Ref Range Status   SARS Coronavirus 2 NEGATIVE NEGATIVE Final    Comment: (NOTE) SARS-CoV-2 target nucleic acids are NOT DETECTED.  The SARS-CoV-2 RNA is generally detectable in upper and lower respiratory specimens during the acute phase of infection. Negative results do not preclude SARS-CoV-2 infection, do not rule out co-infections with other pathogens, and should not be used as the sole basis for treatment or other patient management decisions. Negative results must be combined with clinical  observations, patient history, and epidemiological information. The expected result is Negative.  Fact Sheet for Patients: SugarRoll.be  Fact Sheet for Healthcare Providers: https://www.woods-mathews.com/  This test is not yet approved or cleared by the [place] FDA and  has been authorized for detection and/or diagnosis of SARS-CoV-2 by FDA under an Emergency Use Authorization (EUA). This EUA will remain  in effect (meaning this test can be used) for the duration of the COVID-19 declaration under Se ction 564(b)(1) of the Act, 21 U.S.C. section 360bbb-3(b)(1), unless the authorization is terminated or revoked sooner.  Performed at [facility], [place] [address] [place], [place] [address]   Surgical PCR screen     Status: None   Collection Time: 11/22/20  5:43 AM   Specimen: Nasal Mucosa; Nasal Swab  Result Value Ref Range Status   MRSA, PCR NEGATIVE NEGATIVE Final   Staphylococcus aureus NEGATIVE NEGATIVE Final    Comment: (NOTE) The Xpert SA Assay (FDA approved for NASAL specimens in patients 16 years of age and older), is one component of a comprehensive surveillance program. It is not intended to diagnose infection nor to guide or monitor treatment. Performed at [facility], [place] [address] [place], [place] [address]     No results for input(s): LIPASE, AMYLASE in the last 168 hours. Recent Labs  Lab 11/21/20 0145  AMMONIA 15    Cardiac Enzymes: Recent Labs  Lab 11/21/20 0145  [place]   [facility] If 7PM-7AM, please contact night-coverage at [email or web address], Office  [phone]   11/26/2020, 1:52 PM  LOS: 5  days

## 2020-11-26 NOTE — Progress Notes (Signed)
[facility] ([facility])  Reached out to [facility] to discuss any further discharge plans at this time. Patient is currently resisting SNF stating that she would just like to go back home with hospice but family is not able to care for her at home. Spouse would like for her to do rehab before coming back home but patient is refusing PT.   [facility] will follow up as needed for plans of discharge. Please call with any hospice or palliative related questions.   Clementeen Hoof, BSN, [employer] [phone]

## 2020-11-26 NOTE — TOC Progression Note (Signed)
Transition of Care [facility]) - Progression Note    Patient Details  Name: Mariah Bruce MRN: [id number] Date of Birth: 1946/04/24  Transition of Care [facility]) CM/SW [place], [place] Phone Number: 11/26/2020, 4:20 PM  Clinical Narrative:    CSW met with the patient and her sister at bedside 4x today to discuss rehab placement, pt, declined. Patient has stated she wants to walk and be able to go outside and smoke her cigarettes. Patient is here visiting with her sister, her sister says she cannot take the patient home and provide 24/7 due to her own health issues. Spouse will not transport the patient back to [place] until she can safely ride in a vehicle. Physician and CSW met with the patient at bedside. Physician discussed progressing with therapy and being able to transition back to her home in [place] after getting stronger at a rehab facility.Patient was agreeable at the time. CSW confirm a bed at [facility].    PT followed up the patient and she declined to work with them again and now declines SNF. [place] received a referral in the community from patient spouse before admitting. AC will visit with the patient tomorrow to help determine a disposition.     Expected Discharge Plan: [place] Barriers to Discharge: Continued Medical Work up  Expected Discharge Plan and Services Expected Discharge Plan: [place] In-house Referral: Clinical Social Work Discharge Planning Services: CM Consult Post Acute Care Choice: [place] arrangements for the past 2 months: Single Family Home Expected Discharge Date:  (unknown)                                     Social Determinants of Health (SDOH) Interventions    Readmission Risk Interventions No flowsheet data found.

## 2020-11-26 NOTE — Progress Notes (Signed)
PT Cancellation Note  Patient Details Name: Mariah Bruce MRN: [id number] DOB: 10/17/1946   Cancelled Treatment:    Reason Eval/Treat Not Completed: Patient declined, no reason specified (Pt adamantly refused to get out of bed and also refused bed level exercises. Risks of bed rest were explained to patient, she continued to refuse to participate with PT.)  Philomena Doheny PT 11/26/2020  Acute Rehabilitation Services Pager [phone] Office [phone]

## 2020-11-27 LAB — SARS CORONAVIRUS 2 (TAT 6-24 HRS): SARS Coronavirus 2: NEGATIVE

## 2020-11-27 MED ORDER — VITAMIN D (ERGOCALCIFEROL) 1.25 MG (50000 UNIT) PO CAPS: 50000.0000 [IU] | ORAL_CAPSULE | ORAL | 0 refills | Status: DC

## 2020-11-27 MED ORDER — AMLODIPINE BESYLATE 5 MG PO TABS: 5.0000 mg | ORAL_TABLET | Freq: Every day | ORAL | 0 refills | Status: DC

## 2020-11-27 MED ORDER — APIXABAN 2.5 MG PO TABS: 2.5000 mg | ORAL_TABLET | Freq: Two times a day (BID) | ORAL | 0 refills | Status: DC

## 2020-11-27 MED ORDER — LORAZEPAM 0.5 MG PO TABS
0.5000 mg | ORAL_TABLET | ORAL | 0 refills | Status: DC | PRN
Start: 2020-11-27 — End: 2020-11-28

## 2020-11-27 MED ORDER — OXYCODONE-ACETAMINOPHEN 5-325 MG PO TABS: 2.0000 | ORAL_TABLET | ORAL | 0 refills | Status: DC | PRN

## 2020-11-27 NOTE — Progress Notes (Signed)
Talked with patient about her refusal of PT again. She did not understand that if she refuses to work with therapy that a SNF would not accept her. I explained this, the risks of being bed bound, and the importance of working with therapy so she can be discharged. She agreed to work with therapy tomorrow, she stated "it is my birthday tomorrow, that will be a good gift to myself". I wrote it on her board for the goal tomorrow to be to get up with PT, and made her repeat it back to me several times, that she was agreeable tomorrow.

## 2020-11-27 NOTE — TOC Transition Note (Signed)
Transition of Care [facility]) - CM/SW Discharge Note   Patient Details  Name: Sheresa Cullop MRN: [id number] Date of Birth: 1946/05/20  Transition of [facility]) CM/SW Contact:  Lia Hopping, [place] Phone Number: 11/27/2020, 2:51 PM   Clinical Narrative:    CSW confirm SNF-[facility] ready accept.  Nurse call report to: [phone] Room 106 PTAR arranged to transport.  Sister Langley Gauss notified.      Barriers to Discharge: Barriers Resolved   Patient Goals and CMS Choice   CMS Medicare.gov Compare Post Acute Care list provided to:: Patient Choice offered to / list presented to : Patient  Discharge Placement   Existing PASRR number confirmed : 11/27/20          Patient chooses bed at: [facility] Patient to be transferred to facility by: [place] Name of family member notified: Sister Langley Gauss at bedside Patient and family notified of of transfer: 11/27/20  Discharge Plan and Services In-house Referral: Clinical Social Work Discharge Planning Services: [employer] Consult Post Acute Care Choice: [place]                               Social Determinants of Health (SDOH) Interventions     Readmission Risk Interventions No flowsheet data found.

## 2020-11-27 NOTE — Progress Notes (Signed)
Physical Therapy Treatment Patient Details Name: Mariah Bruce MRN: [id number] DOB: 1946-08-04 Today's Date: 11/27/2020    History of Present Illness Pt is 75 yo female with PMH including lung cancer, COPD, anxiety, depression, GERD and tobacco abuse.  Pt is s/p fall with L hip fx and now s/p L THR by anterior direct approach.    PT Comments    Pt agreeable to participating in therapy today. She ambulated 71' + 5' with RW and min assist to manage RW, distance limited by fatigue. Supervision for bed mobility, min assist for sit to stand. Good progress with mobility today.    Follow Up Recommendations  SNF     Equipment Recommendations  3in1 (PT)    Recommendations for Other Services       Precautions / Restrictions Precautions Precautions: Fall Precaution Comments: Incontinent of urine with standing Restrictions Weight Bearing Restrictions: No Other Position/Activity Restrictions: WBAT    Mobility  Bed Mobility Overal bed mobility: Needs Assistance Bed Mobility: Supine to Sit     Supine to sit: Supervision     General bed mobility comments: HOB up 30*, pulled up on bedrail, VCs technique, increased time  Transfers Overall transfer level: Needs assistance Equipment used: Rolling walker (2 wheeled) Transfers: Sit to/from Stand Sit to Stand: Min assist;From elevated surface         General transfer comment: cues for LE management and use of UEs to self assist.  Physical assist to bring wt up and fwd and to balance in initial standing  Ambulation/Gait Ambulation/Gait assistance: Min assist Gait Distance (Feet): 12 Feet Assistive device: Rolling walker (2 wheeled) Gait Pattern/deviations: Step-to pattern;Decreased step length - right;Decreased step length - left;Shuffle;Trunk flexed Gait velocity: decr   General Gait Details: cues for sequence, posture and position from RW.  Pt ambulated from bed to toilet, then to recliner placed near bathroom. Pt reported  feeling sweaty while having BM on toilet, HR 118, SaO2 98%, BP 121/76. Min A to manage RW.   Stairs             Wheelchair Mobility    Modified Rankin (Stroke Patients Only)       Balance Overall balance assessment: Needs assistance Sitting-balance support: No upper extremity supported;Feet supported Sitting balance-Leahy Scale: Fair     Standing balance support: Bilateral upper extremity supported Standing balance-Leahy Scale: Poor Standing balance comment: Reliant on BUE supoprt on RW with need of assistance of 2 people                            Cognition Arousal/Alertness: Awake/alert Behavior During Therapy: WFL for tasks assessed/performed Overall Cognitive Status: Within Functional Limits for tasks assessed                                        Exercises      General Comments        Pertinent Vitals/Pain Pain Assessment: No/denies pain    Home Living                      Prior Function            PT Goals (current goals can now be found in the care plan section) Acute Rehab PT Goals Patient Stated Goal: pt wants to go to her home in [place]; agreeable to ST-SNF PT Goal Formulation:  With patient Time For Goal Achievement: 12/07/20 Potential to Achieve Goals: Fair Progress towards PT goals: Progressing toward goals    Frequency    Min 3X/week      PT Plan Current plan remains appropriate    Co-evaluation PT/OT/SLP Co-Evaluation/Treatment: Yes Reason for Co-Treatment: Necessary to address cognition/behavior during functional activity;For patient/therapist safety PT goals addressed during session: Mobility/safety with mobility;Balance;Proper use of DME        AM-PAC PT "6 Clicks" Mobility   Outcome Measure  Help needed turning from your back to your side while in a flat bed without using bedrails?: A Little Help needed moving from lying on your back to sitting on the side of a flat bed without  using bedrails?: A Little Help needed moving to and from a bed to a chair (including a wheelchair)?: A Little Help needed standing up from a chair using your arms (e.g., wheelchair or bedside chair)?: A Little Help needed to walk in hospital room?: A Little Help needed climbing 3-5 steps with a railing? : A Lot 6 Click Score: 17    End of Session   Activity Tolerance: Patient tolerated treatment well Patient left: with call bell/phone within reach;with family/visitor present;in chair;with chair alarm set Nurse Communication: Mobility status PT Visit Diagnosis: Muscle weakness (generalized) (M62.81);Difficulty in walking, not elsewhere classified (R26.2)     Time: 1057-1130 PT Time Calculation (min) (ACUTE ONLY): 33 min  Charges:  $Gait Training: 8-22 mins                    Blondell Reveal Kistler PT 11/27/2020  Acute Rehabilitation Services Pager [phone] Office [phone]

## 2020-11-27 NOTE — Progress Notes (Signed)
Occupational Therapy Treatment Patient Details Name: Marcelline Temkin MRN: [id number] DOB: 01-16-1946 Today's Date: 11/27/2020    History of present illness Pt is 75 yo female with PMH including lung cancer, COPD, anxiety, depression, GERD and tobacco abuse.  Pt is s/p fall with L hip fx and now s/p L THR by anterior direct approach.   OT comments  Treatment focused on functional mobility and toileting task. Patient min assist to ambulate to bathroom with RW needing verbal cues for safe walker management and toilet transfer. Patient max assist for toileting and donning clean brief. Patient reports being hot and sweaty while on toilet. VS monitored and WFL. Patient required assistance of two for standing from toilet - for safety - as patient fearful with standing. Patient able to walk short distance to recliner pulled close. Continue to recommend short term rehab at discharge due to patient's continued deficits.   Follow Up Recommendations  SNF    Equipment Recommendations       Recommendations for Other Services      Precautions / Restrictions Precautions Precautions: Fall Precaution Comments: Incontinent of urine with standing Restrictions Weight Bearing Restrictions: No Other Position/Activity Restrictions: WBAT       Mobility Bed Mobility Overal bed mobility: Needs Assistance Bed Mobility: Supine to Sit     Supine to sit: Supervision     General bed mobility comments: HOB up 30*, pulled up on bedrail, VCs technique, increased time  Transfers Overall transfer level: Needs assistance Equipment used: Rolling walker (2 wheeled) Transfers: Sit to/from Stand Sit to Stand: Min assist;From elevated surface         General transfer comment: cues for LE management and use of UEs to self assist.  Physical assist to bring wt up and fwd and to balance in initial standing    Balance Overall balance assessment: Needs assistance Sitting-balance support: No upper extremity  supported;Feet supported Sitting balance-Leahy Scale: Fair     Standing balance support: Bilateral upper extremity supported Standing balance-Leahy Scale: Poor Standing balance comment: Reliant on BUE supoprt on RW with need of assistance of 2 people                           ADL either performed or assessed with clinical judgement   ADL Overall ADL's : Needs assistance/impaired                     Lower Body Dressing: Total assistance;Sit to/from stand Lower Body Dressing Details (indicate cue type and reason): Total assist to don depends. Patent reliant on UE on walker. Toilet Transfer: Regular Toilet;BSC;RW;Ambulation Armed forces technical officer Details (indicate cue type and reason): Patient ambulated to bathroom with min assist and RW. Transfered onto toilet with  verbal cues for safe technique (BSC placed over toilet). Toileting- Clothing Manipulation and Hygiene: Maximal assistance;Sitting/lateral lean;Sit to/from stand Toileting - Clothing Manipulation Details (indicate cue type and reason): Max assist for toileting needed. Patient able to wipe front in seated position but needed assistance with cleaning after BM and clothing managment.             Vision   Vision Assessment?: No apparent visual deficits   Perception     Praxis      Cognition Arousal/Alertness: Awake/alert Behavior During Therapy: WFL for tasks assessed/performed Overall Cognitive Status: Within Functional Limits for tasks assessed  Exercises     Shoulder Instructions       General Comments      Pertinent Vitals/ Pain       Pain Assessment: No/denies pain  Home Living                                          Prior Functioning/Environment              Frequency           Progress Toward Goals  OT Goals(current goals can now be found in the care plan section)  Progress towards OT goals:  Progressing toward goals  Acute Rehab OT Goals Patient Stated Goal: pt wants to go to her home in [place]; agreeable to ST-SNF OT Goal Formulation: With patient Time For Goal Achievement: 12/07/20 Potential to Achieve Goals: [place] Discharge plan remains appropriate    Co-evaluation    PT/OT/SLP Co-Evaluation/Treatment: Yes Reason for Co-Treatment: For patient/therapist safety;To address functional/ADL transfers;Necessary to address cognition/behavior during functional activity (hx of refusing therapy, poor participation and tolerance) PT goals addressed during session: Mobility/safety with mobility OT goals addressed during session: ADL's and self-care      AM-PAC OT "6 Clicks" Daily Activity     Outcome Measure   Help from another person eating meals?: None Help from another person taking care of personal grooming?: A Little Help from another person toileting, which includes using toliet, bedpan, or urinal?: A Lot Help from another person bathing (including washing, rinsing, drying)?: A Little Help from another person to put on and taking off regular upper body clothing?: A Little Help from another person to put on and taking off regular lower body clothing?: Total 6 Click Score: 16    End of Session Equipment Utilized During Treatment: Rolling walker  OT Visit Diagnosis: Unsteadiness on feet (R26.81);Other symptoms and signs involving cognitive function;Muscle weakness (generalized) (M62.81);History of falling (Z91.81);Other abnormalities of gait and mobility (R26.89);Pain   Activity Tolerance Patient limited by pain;Treatment limited secondary to agitation   Patient Left in bed;with call bell/phone within reach;with bed alarm set;with family/visitor present   Nurse Communication Mobility status        Time: 1517-6160 OT Time Calculation (min): 33 min  Charges: OT General Charges $OT Visit: 1 Visit OT Treatments $Self Care/Home Management : 8-22 mins  Derl Barrow,  OTR/L [place]  Office [phone] Pager: [place] 11/27/2020, 12:12 PM

## 2020-11-27 NOTE — Discharge Summary (Addendum)
Physician Discharge Summary  Mariah Bruce HUD:[id number] DOB: 1946/01/31 DOA: 11/20/2020  PCP: Girtha Rm, NP-C  Admit date: 11/20/2020 Discharge date: 11/27/2020  Admitted From: Home Disposition:  SNF  Recommendations for Outpatient Follow-up:  1. Follow up with PCP in 1 week 2. Follow up with Dr. Lyla Glassing in 2 weeks   Discharge Condition: Stable CODE STATUS: DNR  Diet recommendation:  Diet Orders (From admission, onward)    Start     Ordered   11/22/20 1419  Diet regular Room service appropriate? Yes; Fluid consistency: Thin  Diet effective now       Question Answer Comment  Room service appropriate? Yes   Fluid consistency: Thin      11/22/20 1418         Brief/Interim Summary: Mariah Bruce is a 75 year old female with past medical history significant for CKD stage II, lung cancer, COPD, anxiety, depression, GERD who presented after a fall and complaint of hip pain.  She was found to have nondisplaced left femoral neck fracture, underwent total left hip arthroplasty. Found to have low vitamin D level, started on vitamin D supplementation. Cardiac echo showed possible mass on tricuspid annulus, discussed with patient and her sister they both did not want further work-up for cardiac mass. Plan to go to skilled nursing facility.   Discharge Diagnoses:  Active Problems:   Hematuria   Tobacco abuse   Lung cancer (HCC)   Acute metabolic encephalopathy   Protein-calorie malnutrition, severe   Closed left hip fracture (HCC)   Hypokalemia   Abnormal ECG   COPD (chronic obstructive pulmonary disease) (HCC)   Abnormal EKG   1. Nondisplaced left femoral neck fracture, status post left total hip arthroplasty. Ortho recommends weightbearing as tolerated to left lower extremity with a walker. PT recommends going to skilled facility for rehab 2. Vitamin D deficiency. Patient's vitamin D level was found to be 11.54. Started on 50,000 units vitamin D q. 7 days for 4 weeks.  Recommend to check vitamin D level in 4 to 6 weeks. 3. Hypertension. Blood pressure controlled, started on amlodipine 4. History of lung cancer. Patient declined further work-up in the past. 5. AKI.  Resolved 6. Abnormality on echocardiogram. Echocardiogram showed no regional wall motion abnormality however showed abnormal structure on the tricuspid annulus.  Dr. Darrick Meigs discussed with patient and her sister and they declined further work-up by cardiac MRI at this time.   Discharge Instructions  Discharge Instructions    Increase activity slowly   Complete by: As directed    Leave dressing on - Keep it clean, dry, and intact until clinic visit   Complete by: As directed      Allergies as of 11/27/2020      Reactions   Penicillin G Rash   Patient tolerated ANCEF 2 grams on 11/22/20      Medication List    TAKE these medications   amitriptyline 10 MG tablet Commonly known as: ELAVIL Take 1 tablet (10 mg total) by mouth at bedtime.   amLODipine 5 MG tablet Commonly known as: NORVASC Take 1 tablet (5 mg total) by mouth daily. Start taking on: November 28, 2020   apixaban 2.5 MG Tabs tablet Commonly known as: ELIQUIS Take 1 tablet (2.5 mg total) by mouth 2 (two) times daily for 14 days.   citalopram 20 MG tablet Commonly known as: CELEXA Take 1.5 tablets (30 mg total) by mouth daily. Taking 1 & 1/2 tab daily = 30 mg   famotidine 40 MG  tablet Commonly known as: PEPCID Take 1 tablet (40 mg total) by mouth daily for 30 doses.   gabapentin 300 MG capsule Commonly known as: NEURONTIN Take 1-2 capsules (300-600 mg total) by mouth See admin instructions. Taking 300 mg in the Am and 600 mg at bedtime   ibuprofen 200 MG tablet Commonly known as: ADVIL Take 400 mg by mouth every 6 (six) hours as needed for mild pain.   LORazepam 0.5 MG tablet Commonly known as: ATIVAN Take 1-2 tablets (0.5-1 mg total) by mouth every 4 (four) hours as needed for anxiety (Shortness of breath).    oxyCODONE-acetaminophen 5-325 MG tablet Commonly known as: PERCOCET/ROXICET Take 2 tablets by mouth every 4 (four) hours as needed for moderate pain.   senna-docusate 8.6-50 MG tablet Commonly known as: Senokot-S Take 2 tablets by mouth 2 (two) times daily.   venlafaxine XR 37.5 MG 24 hr capsule Commonly known as: EFFEXOR-XR Take 1 capsule (37.5 mg total) by mouth daily. Taking with 75 mg daily = 112.5 mg   venlafaxine XR 75 MG 24 hr capsule Commonly known as: EFFEXOR-XR Take 1 capsule (75 mg total) by mouth daily with breakfast. Taking with 37.5 mg daily = 112.5 mg   Vitamin D (Ergocalciferol) 1.25 MG (50000 UNIT) Caps capsule Commonly known as: DRISDOL Take 1 capsule (50,000 Units total) by mouth every 7 (seven) days for 4 doses. Start taking on: November 29, 2020            Discharge Care Instructions  (From admission, onward)         Start     Ordered   11/27/20 0000  Leave dressing on - Keep it clean, dry, and intact until clinic visit        11/27/20 1155          Follow-up Information    Swinteck, Aaron Edelman, MD. Schedule an appointment as soon as possible for a visit in 2 weeks.   Specialty: Orthopedic Surgery Why: For wound re-check, For suture removal Contact information: [address] [place] [address] [phone]        Girtha Rm, NP-C. Schedule an appointment as soon as possible for a visit in 1 week(s).   Specialty: Family Medicine Contact information: [address] [place] [address] [phone]              Allergies  Allergen Reactions  . Penicillin G Rash    Patient tolerated ANCEF 2 grams on 11/22/20    Consultations:  Orthopedic surgery  Palliative care    Procedures/Studies: CT HEAD WO CONTRAST  Result Date: 11/21/2020 CLINICAL DATA:  Patient fall at home EXAM: CT HEAD WITHOUT CONTRAST TECHNIQUE: Contiguous axial images were obtained from the base of the skull through the vertex without  intravenous contrast. COMPARISON:  November 14, 2020 FINDINGS: Brain: No evidence of acute territorial infarction, hemorrhage, hydrocephalus,extra-axial collection or mass lesion/mass effect. There is dilatation the ventricles and sulci consistent with age-related atrophy. Low-attenuation changes in the deep white matter consistent with small vessel ischemia. Vascular: No hyperdense vessel or unexpected calcification. Skull: The skull is intact. No fracture or focal lesion identified. Sinuses/Orbits: The visualized paranasal sinuses and mastoid air cells are clear. The orbits and globes intact. Other: None IMPRESSION: No acute intracranial abnormality. Findings consistent with age related atrophy and chronic small vessel ischemia Electronically Signed   By: Prudencio Pair M.D.   On: 11/21/2020 02:44   CT HEAD WO CONTRAST  Result Date: 11/14/2020 CLINICAL DATA:  Altered mental status. EXAM: CT HEAD WITHOUT CONTRAST TECHNIQUE: Contiguous axial images were obtained from the base of the skull through the vertex without intravenous contrast. COMPARISON:  None. FINDINGS: Brain: Mild chronic ischemic white matter disease is noted. No mass effect or midline shift is noted. Ventricular size is within normal limits. There is no evidence of mass lesion, hemorrhage or acute infarction. Vascular: No hyperdense vessel or unexpected calcification. Skull: Normal. Negative for fracture or focal lesion. Sinuses/Orbits: No acute finding. Other: None. IMPRESSION: Mild chronic ischemic white matter disease. No acute intracranial abnormality seen. Electronically Signed   By: Marijo Conception M.D.   On: 11/14/2020 11:02   CT Soft Tissue Neck W Contrast  Result Date: 11/13/2020 CLINICAL DATA:  Esophageal pain EXAM: CT NECK WITH CONTRAST TECHNIQUE: Multidetector CT imaging of the neck was performed using the standard protocol following the bolus administration of intravenous contrast. CONTRAST:  29mL OMNIPAQUE IOHEXOL 300 MG/ML  SOLN  COMPARISON:  None. FINDINGS: Pharynx and larynx: There is medialization of the left vocal fold with dilatation of the left piriform sinus. The larynx and pharynx are otherwise normal. Normal epiglottis. No retropharyngeal abnormality. Esophagus is patulous. There is layering contrast material visible within the upper thoracic esophagus. Salivary glands: No inflammation, mass, or stone. Thyroid: Normal. Lymph nodes: None enlarged or abnormal density. Vascular: Calcific aortic atherosclerosis. Limited intracranial: Normal Visualized orbits: Normal Mastoids and visualized paranasal sinuses: Clear. Skeleton: No acute or aggressive process. Upper chest: Please see dedicated report for CT chest. Other: None. IMPRESSION: 1. Medialization of the left vocal fold with dilatation of the left piriform sinus, suggesting left vocal cord paralysis. 2. Patulous esophagus. Aortic Atherosclerosis (ICD10-I70.0). Electronically Signed   By: Ulyses Jarred M.D.   On: 11/13/2020 01:51   CT Chest W Contrast  Result Date: 11/13/2020 CLINICAL DATA:  Chest pain, shortness of breath, incontinent urine, hematuria, abnormal abdominal CT EXAM: CT CHEST WITH CONTRAST TECHNIQUE: Multidetector CT imaging of the chest was performed during intravenous contrast administration. CONTRAST:  29mL OMNIPAQUE IOHEXOL 300 MG/ML  SOLN COMPARISON:  Same-day CT abdomen and pelvis, CT chest 10/05/2019 FINDINGS: Cardiovascular: Cardiac size top-normal to mildly enlarged. Mild compression of the right heart by a chronic chest wall deformity. Calcifications of the mitral annulus and minimally upon the aortic leaflets. Coronary artery calcifications are present as well. No pericardial effusion. No large central or lobar pulmonary artery filling defects within the limitations of this unenhanced CT. Surgical truncation of left hilar vessels, likely related to prior lobectomy. Atherosclerotic plaque within the normal caliber aorta. No acute luminal abnormality of the  imaged aorta. No periaortic stranding or hemorrhage. Normal 3 vessel branching of the aortic arch. Proximal great vessels are mildly calcified but otherwise unremarkable. Mediastinum/Nodes: High attenuation enteric contrast media was administered with the patient on the table for assessment of the thoracic esophagus. There is circumferential thickening of the distal thoracic esophagus and to a lesser extent the GE junction however, there is no extravasation of the enteric contrast media nor extraluminal free air to suggest esophageal perforation. Some minimal adjacent stranding is likely reactive. Fluid within the pericardial recesses. No free mediastinal fluid or gas. No organized collection or abscess. Normal thyroid gland and thoracic inlet. No acute abnormality of the trachea or esophagus. No worrisome mediastinal, hilar or axillary adenopathy. Lungs/Pleura: Postsurgical changes in the lungs likely reflecting a prior left lobectomy, correlate with surgical history. There is a small chronic left pleural effusion with some minimal pleural thickening and adjacent atelectasis, not  significantly changed from comparison exam. There is increasingly conspicuous spiculated foci in the medial left lung apex measuring now 9 x 17 mm, previously 7 x 11 mm (3/25) additional scarring, retraction architectural distortion is seen involving a spiculated nodule previously seen in the right lung apex (3/27 with changes now extending to the pleural surface measuring 2.5 x 2.2 cm in transaxial dimension. Some additional bandlike scarring is seen in the posterior segment right upper lobe (3/60). New sub solid, subpleural nodule measuring 12.5 x 8 mm (3/116) in the periphery of the right lower lobe. No other concerning pulmonary nodules or masses. Background of centrilobular emphysematous change and chronic bronchitic features. Upper Abdomen: Vascular collaterals in the upper abdomen about the esophagus and GE junction. Mild thickening  near the gastric cardia, contiguous with esophageal thickening above. For additional findings please see same day CT of the abdomen and pelvis. Musculoskeletal: Chest wall asymmetry, possibly postsurgical in nature. Paucity of subcutaneous fat, correlate with nutritional status. Multilevel degenerative changes are present in the imaged portions of the spine. Interval vertebroplasty changes involving the T6, T7 vertebrae, unchanged remote vertebral body height loss T10. Age indeterminate though possibly acute on chronic T11 vertebral plana deformity given some adjacent paraspinal thickening. No other acute or conspicuous osseous lesions. IMPRESSION: 1. Circumferential thickening of the distal thoracic esophagus and gastric cardia. No extravasation of administered enteric contrast media nor extraluminal gas to suggest perforation. Minimal adjacent stranding may be merely reactive. 2. Postsurgical changes in the left lung likely reflecting a prior left lower lobectomy, correlate with surgical history. Chronic left basilar effusion and pleural thickening is grossly similar to prior. 3. Increasingly conspicuous spiculated foci in the medial left lung apex measuring now 9 x 17 mm, previously 7 x 11 mm. Additional scarring, retraction architectural distortion is seen involving a spiculated nodule previously seen in the right lung apex with changes now extending to the pleural surface measuring 2.5 x 2.2 cm in transaxial dimension. Findings are concerning for possible malignancy. 4. New sub solid, subpleural nodule measuring 12.5 x 8 mm in the periphery of the right lower lobe. This could reflect an infectious or inflammatory process versus a metastatic lesion. 5. Interval vertebroplasty changes involving the T6, T7 vertebrae. Stable remote compression deformity T10. 6. Possible acute on chronic T11 vertebral plana deformity given some adjacent paraspinal thickening. Correlate for point tenderness. 7. Chronic chest wall  asymmetry. 8. Paucity of subcutaneous fat, correlate with nutritional status. 9. Aortic Atherosclerosis (ICD10-I70.0) 10. Emphysema (ICD10-J43.9) and chronic bronchitic changes. Electronically Signed   By: Lovena Le M.D.   On: 11/13/2020 01:57   CT PELVIS WO CONTRAST  Result Date: 11/21/2020 CLINICAL DATA:  Hip pain after fall EXAM: CT PELVIS WITHOUT CONTRAST TECHNIQUE: Multidetector CT imaging of the pelvis was performed following the standard protocol without intravenous contrast. COMPARISON:  None. FINDINGS: Urinary Tract: The visualized distal ureters and bladder appear unremarkable. Bowel: No bowel wall thickening, distention or surrounding inflammation identified within the pelvis. Vascular/Lymphatic: No enlarged pelvic lymph nodes identified. Scattered aortic atherosclerosis is noted. Reproductive: The uterus and adnexa are unremarkable. Other: There is a soft tissue calcifications seen overlying the left inferior ischial tuberosity. Musculoskeletal: There is a comminuted slightly impacted nondisplaced fracture seen through the left transcervical femoral neck. The femoral head is still well seated within the acetabulum. A small hip joint effusion is seen. There is diffuse osteopenia. Again noted are degenerative changes in the lower lumbar spine. The sacroiliac joints appear to be intact. IMPRESSION: Comminuted impacted  nondisplaced transcervical left femoral neck fracture. Electronically Signed   By: Prudencio Pair M.D.   On: 11/21/2020 00:13   Pelvis Portable  Result Date: 11/22/2020 CLINICAL DATA:  Status post left hip arthroplasty. EXAM: PORTABLE PELVIS 1-2 VIEWS COMPARISON:  Same day. FINDINGS: The left acetabular and femoral components appear to be well situated. Expected postoperative changes seen in the surrounding soft tissues. IMPRESSION: Status post left total hip arthroplasty. Electronically Signed   By: Marijo Conception M.D.   On: 11/22/2020 12:19   DG CHEST PORT 1 VIEW  Result Date:  11/21/2020 CLINICAL DATA:  Hypoxia EXAM: PORTABLE CHEST 1 VIEW COMPARISON:  November 13, 2020 FINDINGS: The heart size and mediastinal contours are mildly enlarged. Aortic knob calcifications are seen. Again noted is increased interstitial markings with fibrosis and scarring at the right lung apex. A unchanged small left pleural effusion is seen. No acute osseous abnormality. IMPRESSION: No significant change with the right apical scarring and fibrosis Small left pleural effusion Electronically Signed   By: Prudencio Pair M.D.   On: 11/21/2020 00:16   DG CHEST PORT 1 VIEW  Result Date: 11/13/2020 CLINICAL DATA:  Chest pain. EXAM: PORTABLE CHEST 1 VIEW COMPARISON:  CT 11/13/2020. FINDINGS: Mediastinum and hilar structures normal. Stable cardiomegaly. No pulmonary venous congestion. Postsurgical changes left lung. Persistent densities noted both upper lungs best seen by prior CT. Nodular density in the right base noted CT not visualized by plain chest x-ray. Reference is made to prior CT of 11/13/2020. Small left pleural effusion. No pneumothorax. IMPRESSION: 1. Stable cardiomegaly.  No pulmonary venous congestion. 2. Postsurgical changes left lung. Persistent densities in the upper lungs, best identified by CT. Nodular density in the right lung base best identified by CT. No acute alveolar infiltrates. Small left pleural effusion. Electronically Signed   By: Marcello Moores  Register   On: 11/13/2020 07:42   DG Knee Left Port  Result Date: 11/21/2020 CLINICAL DATA:  Fall. EXAM: PORTABLE LEFT KNEE - 1-2 VIEW COMPARISON:  No prior. FINDINGS: Diffuse osteopenia. Mild degenerative change with chondrocalcinosis. No acute bony abnormality. No evidence of fracture dislocation. No prominent effusion. IMPRESSION: Diffuse osteopenia. Mild degenerative change with chondrocalcinosis. No acute abnormality identified. Electronically Signed   By: Marcello Moores  Register   On: 11/21/2020 08:43   DG C-Arm 1-60 Min-No Report  Result Date:  11/22/2020 Fluoroscopy was utilized by the requesting physician.  No radiographic interpretation.   ECHOCARDIOGRAM COMPLETE  Result Date: 11/21/2020    ECHOCARDIOGRAM REPORT   Patient Name:   TARENA GOCKLEY Date of Exam: 11/21/2020 Medical Rec #:  [id number]        Height:       68.7 in Accession #:    [id number]       Weight:       100.0 lb Date of Birth:  [birth year]        BSA:          1.534 m Patient Age:    51 years         BP:           197/93 mmHg Patient Gender: F                HR:           89 bpm. Exam Location:  Inpatient Procedure: 2D Echo Indications:    Abnormal ECG R94.31  History:        Patient has no prior history of Echocardiogram examinations.  COPD.  Sonographer:    Mikki Santee RDCS (AE) Referring Phys: [place]  1. Left ventricular ejection fraction, by estimation, is 65 to 70%. The left ventricle has hyperdynamic function. The left ventricle has no regional wall motion abnormalities. Left ventricular diastolic parameters were normal.  2. Right ventricular systolic function is normal. The right ventricular size is normal. There is mildly elevated pulmonary artery systolic pressure.  3. The mitral valve is normal in structure. No evidence of mitral valve regurgitation. No evidence of mitral stenosis.  4. The aortic valve is grossly normal. Aortic valve regurgitation is not visualized. No aortic stenosis is present.  5. The inferior vena cava is normal in size with greater than 50% respiratory variability, suggesting right atrial pressure of 3 mmHg. Comparison(s): No prior Echocardiogram. Conclusion(s)/Recommendation(s): There is an abnormal structure on the tricuspid annulus (vs adjacent RA/RV). This is seen well on images 76 and 77, however not well appreciated in other views. Could consider additional imaging such as cardiac MRI if it would change clinical management. Findings communicated to Dr. Darrick Meigs at 2:38 PM. FINDINGS  Left Ventricle:  LVOT peak gradient 32 mmHg. Left ventricular ejection fraction, by estimation, is 65 to 70%. The left ventricle has hyperdynamic function. The left ventricle has no regional wall motion abnormalities. The left ventricular internal cavity  size was normal in size. There is no left ventricular hypertrophy. Left ventricular diastolic parameters were normal. Right Ventricle: The right ventricular size is normal. Right vetricular wall thickness was not well visualized. Right ventricular systolic function is normal. There is mildly elevated pulmonary artery systolic pressure. The tricuspid regurgitant velocity  is 2.93 m/s, and with an assumed right atrial pressure of 3 mmHg, the estimated right ventricular systolic pressure is 16.1 mmHg. Left Atrium: Left atrial size was normal in size. Right Atrium: Right atrial size was not well visualized. Prominent Eustachian valve. Pericardium: Trivial pericardial effusion is present. Mitral Valve: The mitral valve is normal in structure. No evidence of mitral valve regurgitation. No evidence of mitral valve stenosis. Tricuspid Valve: The tricuspid valve is normal in structure. Tricuspid valve regurgitation is mild. Aortic Valve: The aortic valve is grossly normal. Aortic valve regurgitation is not visualized. No aortic stenosis is present. Pulmonic Valve: The pulmonic valve was not well visualized. Pulmonic valve regurgitation is not visualized. Aorta: The aortic root and ascending aorta are structurally normal, with no evidence of dilitation. Venous: The inferior vena cava is normal in size with greater than 50% respiratory variability, suggesting right atrial pressure of 3 mmHg. IAS/Shunts: The interatrial septum was not well visualized.  LEFT VENTRICLE PLAX 2D LVIDd:         2.40 cm  Diastology LVIDs:         1.60 cm  LV e' medial:    7.18 cm/s LV PW:         0.90 cm  LV E/e' medial:  11.4 LV IVS:        0.80 cm  LV e' lateral:   8.81 cm/s LVOT diam:     1.80 cm  LV E/e'  lateral: 9.3 LV SV:         53 LV SV Index:   34 LVOT Area:     2.54 cm  RIGHT VENTRICLE TAPSE (M-mode): 2.0 cm LEFT ATRIUM             Index LA diam:        2.20 cm 1.43 cm/m LA Vol (A2C):   15.3 ml  9.97 ml/m LA Vol (A4C):   15.3 ml 9.97 ml/m LA Biplane Vol: 16.9 ml 11.01 ml/m  AORTIC VALVE LVOT Vmax:   134.00 cm/s LVOT Vmean:  90.700 cm/s LVOT VTI:    0.208 m  AORTA Ao Root diam: 2.30 cm MITRAL VALVE                TRICUSPID VALVE MV Area (PHT): 4.29 cm     TR Peak grad:   34.3 mmHg MV Decel Time: 177 msec     TR Vmax:        293.00 cm/s MV E velocity: 81.70 cm/s MV A velocity: 152.00 cm/s  SHUNTS MV E/A ratio:  0.54         Systemic VTI:  0.21 m                             Systemic Diam: 1.80 cm Buford Dresser MD Electronically signed by Buford Dresser MD Signature Date/Time: 11/21/2020/2:39:37 PM    Final    CT Renal Stone Study  Result Date: 11/13/2020 CLINICAL DATA:  Hematuria, unknown cause EXAM: CT ABDOMEN AND PELVIS WITHOUT CONTRAST TECHNIQUE: Multidetector CT imaging of the abdomen and pelvis was performed following the standard protocol without IV contrast. COMPARISON:  CT chest 10/05/2019 FINDINGS: Lower chest: Emphysematous changes noted in the lung bases. Bandlike opacity in the lung bases likely reflect areas of scarring. Right lung is otherwise relatively clear. There is a trace left pleural effusion and atelectatic change with some questionable pleural thickening, incompletely assessed on noncontrast exam. Thickened and fluid-filled distal esophagus is noted. Cardiac size within normal limits with slight compression of the right heart by the chest wall deformity. Hypoattenuation of the cardiac blood pool may reflect a mild anemia. Hepatobiliary: No visible focal liver lesion within limitations of this unenhanced CT gallbladder contains some dependently layering attenuation which may reflect biliary sludge. No pericholecystic fluid or inflammation. No biliary ductal dilatation  or intraductal gallstones are seen. Pancreas: There is fatty infiltration of the pancreas, particularly towards the pancreatic head and uncinate. No clear peripancreatic inflammation or pancreatic ductal dilatation. Spleen: Normal in size. No concerning splenic lesions. Adrenals/Urinary Tract: Normal adrenal glands. Slight inferior positioning of the right kidney may related to normal anatomic variance or chest wall deformity. No visible concerning renal lesion. No urolithiasis or hydronephrosis. Mild symmetric bilateral perinephric stranding, a nonspecific finding which may correlate with advanced age or decreased renal function. Bladder is largely decompressed though wall thickening and hazy perivesicular stranding is more conspicuous than mere underdistention. Additionally, there is some questionable asymmetric thickening on the left posterolateral aspect of the bladder wall and some layering hyperdensity in the urinary bladder lumen concerning for hemorrhagic products/clot given hematuria. Stomach/Bowel: Irregular thickening of the fluid-filled distal thoracic esophagus with adjacent inflammatory stranding lower mediastinum. Extensive paraesophageal and gastric vascular collaterals are noted as well. Distal stomach and duodenum are unremarkable. No small bowel thickening or dilatation. Moderate to large colonic stool burden. Normal appendix. No evidence of bowel obstruction. Vascular/Lymphatic: Upper abdominal vascular collaterals. Atherosclerotic calcifications within the abdominal aorta and branch vessels. No aneurysm or ectasia. No enlarged abdominopelvic lymph nodes. Reproductive: Retroverted uterus.  No concerning adnexal lesions. Other: No abdominopelvic free fluid or free gas. No bowel containing hernias. Musculoskeletal: Chest wall deformity, levocurvature of the lumbar spine, remote appearing incomplete burst fracture involving the superior endplate L5 with slight retropulsion, in combination with  severe facet degenerative changes and ligamentum  flavum infolding there is resulting severe canal stenosis at the L4-5 level. Additional moderate to severe stenoses present L3-4 as well multilevel moderate to severe neural foraminal narrowing is seen throughout the lumbar levels. The overall sclerotic appearance of the L5 and S1 vertebral bodies is favored to be on a Modic type bases given the absence of adjacent inflammation, fluid collections or other features to suggest infection. IMPRESSION: 1. Bladder is largely decompressed though wall thickening and hazy perivesicular stranding is more conspicuous than mere underdistention. Additionally, there is some questionable asymmetric thickening on the left posterolateral aspect of the bladder wall and some layering hyperdensity in the urinary bladder lumen concerning for hemorrhagic products/clot given hematuria. Recommend further evaluation with urinalysis and cystoscopy. 2. Mild symmetric bilateral perinephric stranding, a nonspecific finding which may correlate with advanced age or decreased renal function though given bladder findings, ascending tract infection is not excluded. 3. Chronic distal esophageal thickening with some adjacent hazy stranding in the lower mediastinum. Correlate for esophageal symptoms and results of prior endoscopy if performed, otherwise consider direct visualization. 4. Left pleural effusion and atelectatic change with some questionable pleural thickening, incompletely assessed on noncontrast exam. While this could feasibly be related to the esophageal process, the appearance is fairly similar to comparison study from 2020 and could reflect some chronic change. 5. Extensive paraesophageal and gastric vascular collaterals. 6. Remote appearing incomplete burst fracture involving the superior endplate L5 with slight retropulsion, in combination with severe facet degenerative changes and ligamentum flavum infolding there is resulting severe  canal stenosis at the L4-5 level. Additional moderate to severe stenoses present L3-4 as well as multilevel moderate to severe neural foraminal narrowing throughout the lumbar levels. 7. Hypoattenuation of the cardiac blood pool may reflect a mild anemia. 8. Aortic Atherosclerosis (ICD10-I70.0) 9. Emphysema (ICD10-J43.9). These results were called by telephone at the time of interpretation on 11/13/2020 at 12:11 am to provider Tegeler, who verbally acknowledged these results. Electronically Signed   By: Lovena Le M.D.   On: 11/13/2020 00:10   DG HIP PORT UNILAT WITH PELVIS 1V LEFT  Result Date: 11/21/2020 CLINICAL DATA:  Fall yesterday with hip injury EXAM: DG HIP (WITH OR WITHOUT PELVIS) 1V PORT LEFT COMPARISON:  None. FINDINGS: Limited study due to foreshortening of the left femoral neck. There are findings of a nondisplaced femoral neck fracture but need confirmation due to limited projections. No dislocation. No evidence of pelvic ring fracture or diastasis. IMPRESSION: Findings of nondisplaced left femoral neck fracture. Need confirmatory radiographic or CT imaging given rotation and foreshortening of the femoral neck on all of the provided images. Electronically Signed   By: Monte Fantasia M.D.   On: 11/21/2020 08:41   DG HIP OPERATIVE UNILAT W OR W/O PELVIS LEFT  Result Date: 11/22/2020 CLINICAL DATA:  Left hip replacement EXAM: OPERATIVE left HIP (WITH PELVIS IF PERFORMED) 2 VIEWS TECHNIQUE: Fluoroscopic spot image(s) were submitted for interpretation post-operatively. COMPARISON:  11/21/2020 FINDINGS: Left hip replacement in satisfactory position and alignment. No fracture or complication IMPRESSION: Satisfactory left hip replacement. Electronically Signed   By: Franchot Gallo M.D.   On: 11/22/2020 11:40      Discharge Exam: Vitals:   11/27/20 0636 11/27/20 1138  BP: 139/72 121/76  Pulse: 84 (!) 118  Resp: 16   Temp: 98.4 F (36.9 C)   SpO2: 95% 98%    General: Pt is alert, awake,  not in acute distress Cardiovascular: RRR, S1/S2 +, no edema Respiratory: CTA bilaterally, no wheezing, no rhonchi,  no respiratory distress, no conversational dyspnea  Abdominal: Soft, NT, ND, bowel sounds + Extremities: no edema, no cyanosis Psych: Normal mood and affect   The results of significant diagnostics from this hospitalization (including imaging, microbiology, ancillary and laboratory) are listed below for reference.     Microbiology: Recent Results (from the past 240 hour(s))  Urine culture     Status: None   Collection Time: 11/20/20 11:50 PM   Specimen: Urine, Clean Catch  Result Value Ref Range Status   Specimen Description   Final    URINE, CLEAN CATCH Performed at [facility], [place] [address] [place], [place] [address]    Special Requests   Final    NONE Performed at [facility], [place] [address] [place], [place] [address]    Culture   Final    NO GROWTH Performed at [facility], [place] [address], [place], [place] [address]    Report Status 11/22/2020 FINAL  Final  SARS CORONAVIRUS 2 (TAT 6-24 HRS) Nasopharyngeal Nasopharyngeal Swab     Status: None   Collection Time: 11/21/20 12:16 AM   Specimen: Nasopharyngeal Swab  Result Value Ref Range Status   SARS Coronavirus 2 NEGATIVE NEGATIVE Final    Comment: (NOTE) SARS-CoV-2 target nucleic acids are NOT DETECTED.  The SARS-CoV-2 RNA is generally detectable in upper and lower respiratory specimens during the acute phase of infection. Negative results do not preclude SARS-CoV-2 infection, do not rule out co-infections with other pathogens, and should not be used as the sole basis for treatment or other patient management decisions. Negative results must be combined with clinical observations, patient history, and epidemiological information. The expected result is Negative.  Fact Sheet for Patients: SugarRoll.be  Fact Sheet for  Healthcare Providers: https://www.woods-mathews.com/  This test is not yet approved or cleared by the [place] FDA and  has been authorized for detection and/or diagnosis of SARS-CoV-2 by FDA under an Emergency Use Authorization (EUA). This EUA will remain  in effect (meaning this test can be used) for the duration of the COVID-19 declaration under Se ction 564(b)(1) of the Act, 21 U.S.C. section 360bbb-3(b)(1), unless the authorization is terminated or revoked sooner.  Performed at [facility], [place] [address] [place],  [address]   Surgical PCR screen     Status: None   Collection Time: 11/22/20  5:43 AM   Specimen: Nasal Mucosa; Nasal Swab  Result Value Ref Range Status   MRSA, PCR NEGATIVE NEGATIVE Final   Staphylococcus aureus NEGATIVE NEGATIVE Final    Comment: (NOTE) The Xpert SA Assay (FDA approved for NASAL specimens in patients 54 years of age and older), is one component of a comprehensive surveillance program. It is not intended to diagnose infection nor to guide or monitor treatment. Performed at [facility], [place] [address] [place],  [address]      Labs: BNP (last 3 results) No results for input(s): BNP in the last 8760 hours. Basic Metabolic Panel: Recent Labs  Lab 11/21/20 0145 11/21/20 0444 11/23/20 0232 11/23/20 1634 11/24/20 0319 11/25/20 0252 11/26/20 0332  NA  --    < > 135 135 134* 136 131*  K  --    < > 4.4 3.7 3.4* 3.1* 3.5  CL  --    < > 100 101 102 100 101  CO2  --    < > 29 23 24 26  21*  GLUCOSE  --    < >  123* 128* 85 101* 103*  BUN  --    < > 20 27* 26* 12 11  CREATININE  --    < > 0.84 1.14* 0.94 0.60 0.60  CALCIUM  --    < > 8.3* 8.6* 8.0* 8.8* 8.3*  MG 1.3*  --   --   --   --   --  2.2  PHOS 2.2*  --   --   --   --   --   --    < > = values in this interval not displayed.   Liver Function Tests: Recent Labs  Lab 11/21/20 0145 11/21/20 0444 11/22/20 0553  AST 27 23 21    ALT 29 25 23   ALKPHOS 103 88 82  BILITOT 1.0 0.9 0.9  PROT 6.1* 5.3* 5.8*  ALBUMIN 3.5 3.0* 3.3*   No results for input(s): LIPASE, AMYLASE in the last 168 hours. Recent Labs  Lab 11/21/20 0145  AMMONIA 15   CBC: Recent Labs  Lab 11/20/20 2334 11/21/20 0444 11/22/20 0553 11/23/20 0232 11/24/20 0319 11/25/20 0252  WBC 15.6* 11.9* 8.4 9.9 6.8 6.3  NEUTROABS 13.7*  --   --   --   --   --   HGB 13.5 11.8* 12.0 10.0* 9.0* 10.5*  HCT 41.1 35.9* 36.1 30.4* 27.6* 31.4*  MCV 99.0 98.4 98.4 100.7* 100.7* 99.1  PLT 151 134* 124* 138* 141* 162   Cardiac Enzymes: Recent Labs  Lab 11/21/20 0145  CKTOTAL 210   BNP: Invalid input(s): POCBNP CBG: No results for input(s): GLUCAP in the last 168 hours. D-Dimer No results for input(s): DDIMER in the last 72 hours. Hgb A1c No results for input(s): HGBA1C in the last 72 hours. Lipid Profile No results for input(s): CHOL, HDL, LDLCALC, TRIG, CHOLHDL, LDLDIRECT in the last 72 hours. Thyroid function studies No results for input(s): TSH, T4TOTAL, T3FREE, THYROIDAB in the last 72 hours.  Invalid input(s): FREET3 Anemia work up No results for input(s): VITAMINB12, FOLATE, FERRITIN, TIBC, IRON, RETICCTPCT in the last 72 hours. Urinalysis    Component Value Date/Time   COLORURINE YELLOW 11/20/2020 2350   APPEARANCEUR CLEAR 11/20/2020 2350   LABSPEC 1.023 11/20/2020 2350   PHURINE 5.0 11/20/2020 2350   GLUCOSEU 50 (A) 11/20/2020 2350   HGBUR MODERATE (A) 11/20/2020 2350   BILIRUBINUR NEGATIVE 11/20/2020 2350   KETONESUR 5 (A) 11/20/2020 2350   PROTEINUR 100 (A) 11/20/2020 2350   NITRITE NEGATIVE 11/20/2020 2350   LEUKOCYTESUR NEGATIVE 11/20/2020 2350   Sepsis Labs Invalid input(s): PROCALCITONIN,  WBC,  LACTICIDVEN Microbiology Recent Results (from the past 240 hour(s))  Urine culture     Status: None   Collection Time: 11/20/20 11:50 PM   Specimen: Urine, Clean Catch  Result Value Ref Range Status   Specimen Description    Final    URINE, CLEAN CATCH Performed at [facility], [place] [address] [place], [place] [address]    Special Requests   Final    NONE Performed at [facility], [place] [address] [place], [place] [address]    Culture   Final    NO GROWTH Performed at [facility], [place] [address], [place], [place] [address]    Report Status 11/22/2020 FINAL  Final  SARS CORONAVIRUS 2 (TAT 6-24 HRS) Nasopharyngeal Nasopharyngeal Swab     Status: None   Collection Time: 11/21/20 12:16 AM   Specimen: Nasopharyngeal Swab  Result Value Ref Range Status   SARS Coronavirus 2  NEGATIVE NEGATIVE Final    Comment: (NOTE) SARS-CoV-2 target nucleic acids are NOT DETECTED.  The SARS-CoV-2 RNA is generally detectable in upper and lower respiratory specimens during the acute phase of infection. Negative results do not preclude SARS-CoV-2 infection, do not rule out co-infections with other pathogens, and should not be used as the sole basis for treatment or other patient management decisions. Negative results must be combined with clinical observations, patient history, and epidemiological information. The expected result is Negative.  Fact Sheet for Patients: SugarRoll.be  Fact Sheet for Healthcare Providers: https://www.woods-mathews.com/  This test is not yet approved or cleared by the [place] FDA and  has been authorized for detection and/or diagnosis of SARS-CoV-2 by FDA under an Emergency Use Authorization (EUA). This EUA will remain  in effect (meaning this test can be used) for the duration of the COVID-19 declaration under Se ction 564(b)(1) of the Act, 21 U.S.C. section 360bbb-3(b)(1), unless the authorization is terminated or revoked sooner.  Performed at [facility], [place] [address], [place], [place] [address]   Surgical PCR screen     Status: None   Collection Time: 11/22/20  5:43 AM   Specimen:  Nasal Mucosa; Nasal Swab  Result Value Ref Range Status   MRSA, PCR NEGATIVE NEGATIVE Final   Staphylococcus aureus NEGATIVE NEGATIVE Final    Comment: (NOTE) The Xpert SA Assay (FDA approved for NASAL specimens in patients 20 years of age and older), is one component of a comprehensive surveillance program. It is not intended to diagnose infection nor to guide or monitor treatment. Performed at [facility], [place] [address] [place], [place] [address]      Patient was seen and examined on the day of discharge and was found to be in stable condition. Time coordinating discharge: 40 minutes including assessment and coordination of care, as well as examination of the patient.   SIGNED:  Dessa Phi, DO [facility] 11/27/2020, 11:57 AM

## 2020-11-27 NOTE — Plan of Care (Signed)
Patient discharged to [facility] via [place], report called to [facility] staff

## 2020-11-28 ENCOUNTER — Non-Acute Institutional Stay (SKILLED_NURSING_FACILITY): Payer: Federal, State, Local not specified - PPO | Admitting: Adult Health

## 2020-11-28 ENCOUNTER — Encounter: Payer: Self-pay | Admitting: Adult Health

## 2020-11-28 ENCOUNTER — Telehealth: Payer: Self-pay

## 2020-11-28 DIAGNOSIS — N179 Acute kidney failure, unspecified: Secondary | ICD-10-CM | POA: Diagnosis not present

## 2020-11-28 DIAGNOSIS — I10 Essential (primary) hypertension: Secondary | ICD-10-CM

## 2020-11-28 DIAGNOSIS — K5909 Other constipation: Secondary | ICD-10-CM

## 2020-11-28 DIAGNOSIS — S72002S Fracture of unspecified part of neck of left femur, sequela: Secondary | ICD-10-CM

## 2020-11-28 DIAGNOSIS — E43 Unspecified severe protein-calorie malnutrition: Secondary | ICD-10-CM

## 2020-11-28 DIAGNOSIS — J449 Chronic obstructive pulmonary disease, unspecified: Secondary | ICD-10-CM | POA: Diagnosis not present

## 2020-11-28 DIAGNOSIS — K219 Gastro-esophageal reflux disease without esophagitis: Secondary | ICD-10-CM

## 2020-11-28 DIAGNOSIS — C3492 Malignant neoplasm of unspecified part of left bronchus or lung: Secondary | ICD-10-CM

## 2020-11-28 DIAGNOSIS — F339 Major depressive disorder, recurrent, unspecified: Secondary | ICD-10-CM

## 2020-11-28 DIAGNOSIS — M792 Neuralgia and neuritis, unspecified: Secondary | ICD-10-CM

## 2020-11-28 NOTE — Progress Notes (Deleted)
Location:    [facility] Room Number: 106/P Place of Service:  SNF [phone]) Provider:  Phillips Grout NP   Girtha Rm, NP-C  Patient Care Team: Girtha Rm, NP-C as PCP - General [facility])  Extended Emergency Contact Information Primary Emergency Contact: ESTRIDGE,DENISE Address: [address] [place] [address] Johnnette Litter of [place] Phone: [phone] Relation: Sister Secondary Emergency Contact: Joby, Hershkowitz Mobile Phone: [phone] Relation: Significant other  Code Status: DNR  Goals of care: Advanced Directive information Advanced Directives 11/28/2020  Does Patient Have a Medical Advance Directive? Yes  Type of Advance Directive Out of facility DNR (pink MOST or yellow form)  Does patient want to make changes to medical advance directive? No - Patient declined  Would patient like information on creating a medical advance directive? -  Pre-existing out of facility DNR order (yellow form or pink MOST form) Yellow form placed in chart (order not valid for inpatient use)     Chief Complaint  Patient presents with  . Hospitalization Follow-up    Hospitalization Follow Up    HPI:  Pt is a 75 y.o. female seen today for a hospital f/u s/p admission from  Past Medical History:  Diagnosis Date  . Anxiety   . Chronic pain   . COPD (chronic obstructive pulmonary disease) ([place])   . Depression   . GERD (gastroesophageal reflux disease)   . Lung cancer [facility])    Past Surgical History:  Procedure Laterality Date  . ANTERIOR APPROACH HEMI HIP ARTHROPLASTY Left 11/22/2020   Procedure: ANTERIOR APPROACH HIP ARTHROPLASTY;  Surgeon: Rod Can, MD;  Location: [facility] ORS;  Service: Orthopedics;  Laterality: Left;  . BACK SURGERY    . HERNIA REPAIR    . TONSILLECTOMY      Allergies  Allergen Reactions  . Penicillin G Rash    Patient tolerated ANCEF 2 grams on 11/22/20    Allergies as of 11/28/2020       Reactions   Penicillin G Rash   Patient tolerated ANCEF 2 grams on 11/22/20      Medication List       Accurate as of November 28, 2020 11:21 AM. If you have any questions, ask your nurse or doctor.        amitriptyline 10 MG tablet Commonly known as: ELAVIL Take 1 tablet (10 mg total) by mouth at bedtime.   amLODipine 5 MG tablet Commonly known as: NORVASC Take 1 tablet (5 mg total) by mouth daily.   apixaban 2.5 MG Tabs tablet Commonly known as: ELIQUIS Take 1 tablet (2.5 mg total) by mouth 2 (two) times daily for 14 days.   citalopram 20 MG tablet Commonly known as: CELEXA Take 1.5 tablets (30 mg total) by mouth daily. Taking 1 & 1/2 tab daily = 30 mg   famotidine 40 MG tablet Commonly known as: PEPCID Take 1 tablet (40 mg total) by mouth daily for 30 doses.   gabapentin 300 MG capsule Commonly known as: NEURONTIN Take 1-2 capsules (300-600 mg total) by mouth See admin instructions. Taking 300 mg in the Am and 600 mg at bedtime   ibuprofen 200 MG tablet Commonly known as: ADVIL Take 400 mg by mouth every 6 (six) hours as needed for mild pain.   LORazepam 0.5 MG tablet Commonly known as: ATIVAN Take 0.5 mg by mouth every 4 (four) hours as needed for anxiety. What  changed: Another medication with the same name was removed. Continue taking this medication, and follow the directions you see here. Changed by: Gerlene Fee, NP   oxyCODONE-acetaminophen 5-325 MG tablet Commonly known as: PERCOCET/ROXICET Take 1 tablet by mouth every 6 (six) hours as needed for severe pain. What changed: Another medication with the same name was removed. Continue taking this medication, and follow the directions you see here. Changed by: Gerlene Fee, NP   senna-docusate 8.6-50 MG tablet Commonly known as: Senokot-S Take 2 tablets by mouth 2 (two) times daily.   venlafaxine XR 37.5 MG 24 hr capsule Commonly known as: EFFEXOR-XR Take 1 capsule (37.5 mg total) by mouth daily.  Taking with 75 mg daily = 112.5 mg   venlafaxine XR 75 MG 24 hr capsule Commonly known as: EFFEXOR-XR Take 1 capsule (75 mg total) by mouth daily with breakfast. Taking with 37.5 mg daily = 112.5 mg   Vitamin D (Ergocalciferol) 1.25 MG (50000 UNIT) Caps capsule Commonly known as: DRISDOL Take 1 capsule (50,000 Units total) by mouth every 7 (seven) days for 4 doses. Start taking on: November 29, 2020       Review of Systems  Immunization History  Administered Date(s) Administered  . Fluad Quad(high Dose 65+) 10/05/2019  . Influenza-Unspecified 08/02/2020  . Janssen (J&J) SARS-COV-2 Vaccination 01/01/2020   Pertinent  Health Maintenance Due  Topic Date Due  . COLONOSCOPY (Pts 45-44yrs Insurance coverage will need to be confirmed)  Never done  . DEXA SCAN  Never done  . PNA vac Low Risk Adult (1 of 2 - PCV13) Never done  . INFLUENZA VACCINE  Completed   Fall Risk  10/05/2019  Falls in the past year? 0  Number falls in past yr: 0  Injury with Fall? 0   Functional Status Survey:    Vitals:   11/28/20 1118  BP: 135/81  Pulse: 72  Resp: 18  Temp: 98.2 F (36.8 C)  SpO2: 93%  Weight: 99 lb 10.4 oz (45.2 kg)  Height: 6' (1.829 m)   Body mass index is 13.51 kg/m. Physical Exam  Labs reviewed: Recent Labs    11/21/20 0145 11/21/20 0444 11/24/20 0319 11/25/20 0252 11/26/20 0332  NA  --    < > 134* 136 131*  K  --    < > 3.4* 3.1* 3.5  CL  --    < > 102 100 101  CO2  --    < > 24 26 21*  GLUCOSE  --    < > 85 101* 103*  BUN  --    < > 26* 12 11  CREATININE  --    < > 0.94 0.60 0.60  CALCIUM  --    < > 8.0* 8.8* 8.3*  MG 1.3*  --   --   --  2.2  PHOS 2.2*  --   --   --   --    < > = values in this interval not displayed.   Recent Labs    11/21/20 0145 11/21/20 0444 11/22/20 0553  AST 27 23 21   ALT 29 25 23   ALKPHOS 103 88 82  BILITOT 1.0 0.9 0.9  PROT 6.1* 5.3* 5.8*  ALBUMIN 3.5 3.0* 3.3*   Recent Labs    11/12/20 2022 11/13/20 0613  11/15/20 0505 11/20/20 2334 11/21/20 0444 11/23/20 0232 11/24/20 0319 11/25/20 0252  WBC 17.2*   < > 8.4 15.6*   < > 9.9 6.8 6.3  NEUTROABS 15.8*  --  5.3 13.7*  --   --   --   --   HGB 15.2*   < > 11.6* 13.5   < > 10.0* 9.0* 10.5*  HCT 46.6*   < > 33.7* 41.1   < > 30.4* 27.6* 31.4*  MCV 99.4   < > 96.3 99.0   < > 100.7* 100.7* 99.1  PLT 380   < > 221 151   < > 138* 141* 162   < > = values in this interval not displayed.   No results found for: TSH No results found for: HGBA1C No results found for: CHOL, HDL, LDLCALC, LDLDIRECT, TRIG, CHOLHDL  Significant Diagnostic Results in last 30 days:  CT HEAD WO CONTRAST  Result Date: 11/21/2020 CLINICAL DATA:  Patient fall at home EXAM: CT HEAD WITHOUT CONTRAST TECHNIQUE: Contiguous axial images were obtained from the base of the skull through the vertex without intravenous contrast. COMPARISON:  November 14, 2020 FINDINGS: Brain: No evidence of acute territorial infarction, hemorrhage, hydrocephalus,extra-axial collection or mass lesion/mass effect. There is dilatation the ventricles and sulci consistent with age-related atrophy. Low-attenuation changes in the deep white matter consistent with small vessel ischemia. Vascular: No hyperdense vessel or unexpected calcification. Skull: The skull is intact. No fracture or focal lesion identified. Sinuses/Orbits: The visualized paranasal sinuses and mastoid air cells are clear. The orbits and globes intact. Other: None IMPRESSION: No acute intracranial abnormality. Findings consistent with age related atrophy and chronic small vessel ischemia Electronically Signed   By: Prudencio Pair M.D.   On: 11/21/2020 02:44   CT PELVIS WO CONTRAST  Result Date: 11/21/2020 CLINICAL DATA:  Hip pain after fall EXAM: CT PELVIS WITHOUT CONTRAST TECHNIQUE: Multidetector CT imaging of the pelvis was performed following the standard protocol without intravenous contrast. COMPARISON:  None. FINDINGS: Urinary Tract: The  visualized distal ureters and bladder appear unremarkable. Bowel: No bowel wall thickening, distention or surrounding inflammation identified within the pelvis. Vascular/Lymphatic: No enlarged pelvic lymph nodes identified. Scattered aortic atherosclerosis is noted. Reproductive: The uterus and adnexa are unremarkable. Other: There is a soft tissue calcifications seen overlying the left inferior ischial tuberosity. Musculoskeletal: There is a comminuted slightly impacted nondisplaced fracture seen through the left transcervical femoral neck. The femoral head is still well seated within the acetabulum. A small hip joint effusion is seen. There is diffuse osteopenia. Again noted are degenerative changes in the lower lumbar spine. The sacroiliac joints appear to be intact. IMPRESSION: Comminuted impacted nondisplaced transcervical left femoral neck fracture. Electronically Signed   By: Prudencio Pair M.D.   On: 11/21/2020 00:13   DG CHEST PORT 1 VIEW  Result Date: 11/21/2020 CLINICAL DATA:  Hypoxia EXAM: PORTABLE CHEST 1 VIEW COMPARISON:  November 13, 2020 FINDINGS: The heart size and mediastinal contours are mildly enlarged. Aortic knob calcifications are seen. Again noted is increased interstitial markings with fibrosis and scarring at the right lung apex. A unchanged small left pleural effusion is seen. No acute osseous abnormality. IMPRESSION: No significant change with the right apical scarring and fibrosis Small left pleural effusion Electronically Signed   By: Prudencio Pair M.D.   On: 11/21/2020 00:16   DG Knee Left Port  Result Date: 11/21/2020 CLINICAL DATA:  Fall. EXAM: PORTABLE LEFT KNEE - 1-2 VIEW COMPARISON:  No prior. FINDINGS: Diffuse osteopenia. Mild degenerative change with chondrocalcinosis. No acute bony abnormality. No evidence of fracture dislocation. No prominent effusion. IMPRESSION: Diffuse osteopenia. Mild degenerative change with chondrocalcinosis. No acute abnormality identified.  Electronically Signed  ByMarcello Moores  Register   On: 11/21/2020 08:43   ECHOCARDIOGRAM COMPLETE  Result Date: 11/21/2020    ECHOCARDIOGRAM REPORT   Patient Name:   Mariah Bruce Date of Exam: 11/21/2020 Medical Rec #:  [id number]        Height:       68.7 in Accession #:    [id number]       Weight:       100.0 lb Date of Birth:  03/26/1946        BSA:          1.534 m Patient Age:    74 years         BP:           197/93 mmHg Patient Gender: F                HR:           89 bpm. Exam Location:  Inpatient Procedure: 2D Echo Indications:    Abnormal ECG R94.31  History:        Patient has no prior history of Echocardiogram examinations.                 COPD.  Sonographer:    Mikki Santee RDCS (AE) Referring Phys: [place]  1. Left ventricular ejection fraction, by estimation, is 65 to 70%. The left ventricle has hyperdynamic function. The left ventricle has no regional wall motion abnormalities. Left ventricular diastolic parameters were normal.  2. Right ventricular systolic function is normal. The right ventricular size is normal. There is mildly elevated pulmonary artery systolic pressure.  3. The mitral valve is normal in structure. No evidence of mitral valve regurgitation. No evidence of mitral stenosis.  4. The aortic valve is grossly normal. Aortic valve regurgitation is not visualized. No aortic stenosis is present.  5. The inferior vena cava is normal in size with greater than 50% respiratory variability, suggesting right atrial pressure of 3 mmHg. Comparison(s): No prior Echocardiogram. Conclusion(s)/Recommendation(s): There is an abnormal structure on the tricuspid annulus (vs adjacent RA/RV). This is seen well on images 76 and 77, however not well appreciated in other views. Could consider additional imaging such as cardiac MRI if it would change clinical management. Findings communicated to Dr. Darrick Meigs at 2:38 PM. FINDINGS  Left Ventricle: LVOT peak gradient 32 mmHg. Left  ventricular ejection fraction, by estimation, is 65 to 70%. The left ventricle has hyperdynamic function. The left ventricle has no regional wall motion abnormalities. The left ventricular internal cavity  size was normal in size. There is no left ventricular hypertrophy. Left ventricular diastolic parameters were normal. Right Ventricle: The right ventricular size is normal. Right vetricular wall thickness was not well visualized. Right ventricular systolic function is normal. There is mildly elevated pulmonary artery systolic pressure. The tricuspid regurgitant velocity  is 2.93 m/s, and with an assumed right atrial pressure of 3 mmHg, the estimated right ventricular systolic pressure is 61.4 mmHg. Left Atrium: Left atrial size was normal in size. Right Atrium: Right atrial size was not well visualized. Prominent Eustachian valve. Pericardium: Trivial pericardial effusion is present. Mitral Valve: The mitral valve is normal in structure. No evidence of mitral valve regurgitation. No evidence of mitral valve stenosis. Tricuspid Valve: The tricuspid valve is normal in structure. Tricuspid valve regurgitation is mild. Aortic Valve: The aortic valve is grossly normal. Aortic valve regurgitation is not visualized. No aortic stenosis is present. Pulmonic Valve: The pulmonic valve was not well visualized. Pulmonic  valve regurgitation is not visualized. Aorta: The aortic root and ascending aorta are structurally normal, with no evidence of dilitation. Venous: The inferior vena cava is normal in size with greater than 50% respiratory variability, suggesting right atrial pressure of 3 mmHg. IAS/Shunts: The interatrial septum was not well visualized.  LEFT VENTRICLE PLAX 2D LVIDd:         2.40 cm  Diastology LVIDs:         1.60 cm  LV e' medial:    7.18 cm/s LV PW:         0.90 cm  LV E/e' medial:  11.4 LV IVS:        0.80 cm  LV e' lateral:   8.81 cm/s LVOT diam:     1.80 cm  LV E/e' lateral: 9.3 LV SV:         53 LV SV  Index:   34 LVOT Area:     2.54 cm  RIGHT VENTRICLE TAPSE (M-mode): 2.0 cm LEFT ATRIUM             Index LA diam:        2.20 cm 1.43 cm/m LA Vol (A2C):   15.3 ml 9.97 ml/m LA Vol (A4C):   15.3 ml 9.97 ml/m LA Biplane Vol: 16.9 ml 11.01 ml/m  AORTIC VALVE LVOT Vmax:   134.00 cm/s LVOT Vmean:  90.700 cm/s LVOT VTI:    0.208 m  AORTA Ao Root diam: 2.30 cm MITRAL VALVE                TRICUSPID VALVE MV Area (PHT): 4.29 cm     TR Peak grad:   34.3 mmHg MV Decel Time: 177 msec     TR Vmax:        293.00 cm/s MV E velocity: 81.70 cm/s MV A velocity: 152.00 cm/s  SHUNTS MV E/A ratio:  0.54         Systemic VTI:  0.21 m                             Systemic Diam: 1.80 cm Buford Dresser MD Electronically signed by Buford Dresser MD Signature Date/Time: 11/21/2020/2:39:37 PM    Final    DG HIP PORT UNILAT WITH PELVIS 1V LEFT  Result Date: 11/21/2020 CLINICAL DATA:  Fall yesterday with hip injury EXAM: DG HIP (WITH OR WITHOUT PELVIS) 1V PORT LEFT COMPARISON:  None. FINDINGS: Limited study due to foreshortening of the left femoral neck. There are findings of a nondisplaced femoral neck fracture but need confirmation due to limited projections. No dislocation. No evidence of pelvic ring fracture or diastasis. IMPRESSION: Findings of nondisplaced left femoral neck fracture. Need confirmatory radiographic or CT imaging given rotation and foreshortening of the femoral neck on all of the provided images. Electronically Signed   By: Monte Fantasia M.D.   On: 11/21/2020 08:41    Assessment/Plan There are no diagnoses linked to this encounter.   Family/ staff Communication:   Labs/tests ordered:

## 2020-11-28 NOTE — Progress Notes (Signed)
Location:  [facility] Room Number: 106/P Place of Service:  SNF (31)   CODE STATUS: DNR  Allergies  Allergen Reactions  . Penicillin G Rash    Patient tolerated ANCEF 2 grams on 11/22/20    Chief Complaint  Patient presents with  . Hospitalization Follow-up    Hospitalization Follow Up    HPI:  She is a 31 year oldwowman who has been hospitalized from 11-20-20 through 11-27-20.  She has a past medical history including: lung cancer; copd; depression and gerd. She had a mechanical fall at home and presented to the ED due to pain in her left hip. She was found to have a nondisplaced fracture or left femoral neck. She underwent a total left hip arthroplasty. She does have a low vit D level and will need supplementation. She is here for short term rehab with her goal to return back home she denies any pain at this time. She denies any problems with constipation; denies any problems with insomnia. She will continue to be followed for her chronic illnesses including:Chronic constipation: . Protein calorie malnutrition severe:.Major depression recurrent chronic:       Past Medical History:  Diagnosis Date  . Anxiety   . Chronic pain   . COPD (chronic obstructive pulmonary disease) ([place])   . Depression   . GERD (gastroesophageal reflux disease)   . Lung cancer [facility])     Past Surgical History:  Procedure Laterality Date  . ANTERIOR APPROACH HEMI HIP ARTHROPLASTY Left 11/22/2020   Procedure: ANTERIOR APPROACH HIP ARTHROPLASTY;  Surgeon: Rod Can, MD;  Location: [facility] ORS;  Service: Orthopedics;  Laterality: Left;  . BACK SURGERY    . HERNIA REPAIR    . TONSILLECTOMY      Social History   Socioeconomic History  . Marital status: Married    Spouse name: Not on file  . Number of children: Not on file  . Years of education: Not on file  . Highest education level: Not on file  Occupational History  . Not on file  Tobacco Use  . Smoking status:  Current Every Day Smoker    Packs/day: 1.00    Years: 55.00    Pack years: 55.00    Types: Cigarettes  . Smokeless tobacco: Never Used  Substance and Sexual Activity  . Alcohol use: Yes    Comment: 2 beers a night  . Drug use: Never  . Sexual activity: Not on file  Other Topics Concern  . Not on file  Social History Narrative  . Not on file   Social Determinants of Health   Financial Resource Strain: Not on file  Food Insecurity: Not on file  Transportation Needs: Not on file  Physical Activity: Not on file  Stress: Not on file  Social Connections: Not on file  Intimate Partner Violence: Not on file   Family History  Problem Relation Age of Onset  . Hypertension Other       VITAL SIGNS BP 135/81   Pulse 72   Temp 98.2 F (36.8 C)   Resp 18   Ht 6' (1.829 m)   Wt 99 lb 10.4 oz (45.2 kg)   SpO2 93%   BMI 13.51 kg/m   Outpatient Encounter Medications as of 11/28/2020  Medication Sig  . amitriptyline (ELAVIL) 10 MG tablet Take 1 tablet (10 mg total) by mouth at bedtime.  Marland Kitchen amLODipine (NORVASC) 5 MG tablet Take 1 tablet (5 mg total) by mouth daily.  Marland Kitchen  apixaban (ELIQUIS) 2.5 MG TABS tablet Take 1 tablet (2.5 mg total) by mouth 2 (two) times daily for 14 days.  . citalopram (CELEXA) 20 MG tablet Take 1.5 tablets (30 mg total) by mouth daily. Taking 1 & 1/2 tab daily = 30 mg  . famotidine (PEPCID) 40 MG tablet Take 1 tablet (40 mg total) by mouth daily for 30 doses.  Marland Kitchen gabapentin (NEURONTIN) 300 MG capsule Take 1-2 capsules (300-600 mg total) by mouth See admin instructions. Taking 300 mg in the Am and 600 mg at bedtime  . ibuprofen (ADVIL) 200 MG tablet Take 400 mg by mouth every 6 (six) hours as needed for mild pain.  Marland Kitchen LORazepam (ATIVAN) 0.5 MG tablet Take 0.5 mg by mouth every 4 (four) hours as needed for anxiety.  Marland Kitchen oxyCODONE-acetaminophen (PERCOCET/ROXICET) 5-325 MG tablet Take 1 tablet by mouth every 6 (six) hours as needed for severe pain.  Marland Kitchen senna-docusate  (SENOKOT-S) 8.6-50 MG tablet Take 2 tablets by mouth 2 (two) times daily.  Marland Kitchen venlafaxine XR (EFFEXOR-XR) 37.5 MG 24 hr capsule Take 1 capsule (37.5 mg total) by mouth daily. Taking with 75 mg daily = 112.5 mg  . venlafaxine XR (EFFEXOR-XR) 75 MG 24 hr capsule Take 1 capsule (75 mg total) by mouth daily with breakfast. Taking with 37.5 mg daily = 112.5 mg  . [START ON 11/29/2020] Vitamin D, Ergocalciferol, (DRISDOL) 1.25 MG (50000 UNIT) CAPS capsule Take 1 capsule (50,000 Units total) by mouth every 7 (seven) days for 4 doses.  . [DISCONTINUED] LORazepam (ATIVAN) 0.5 MG tablet Take 1-2 tablets (0.5-1 mg total) by mouth every 4 (four) hours as needed for anxiety (Shortness of breath).  . [DISCONTINUED] oxyCODONE-acetaminophen (PERCOCET/ROXICET) 5-325 MG tablet Take 2 tablets by mouth every 4 (four) hours as needed for moderate pain.   No facility-administered encounter medications on file as of 11/28/2020.     SIGNIFICANT DIAGNOSTIC EXAMS  TODAY  11-20-20: ct of pelvis: Comminuted impacted nondisplaced transcervical left femoral neck fracture.  11-21-20: chest x-ray: No significant change with the right apical scarring and fibrosis Small left pleural effusion  11-22-19: left hip x-ray: Findings of nondisplaced left femoral neck fracture. Need confirmatory radiographic or CT imaging given rotation andforeshortening of the femoral neck on all of the provided images.  11-21-20: 2-d echo:  1. Left ventricular ejection fraction, by estimation, is 65 to 70%. The left ventricle has hyperdynamic function. The left ventricle has no regional wall motion abnormalities. Left ventricular diastolic parameters were normal.   2. Right ventricular systolic function is normal. The right ventricular size is normal. There is mildly elevated pulmonary artery systolic pressure.   3. The mitral valve is normal in structure. No evidence of mitral valve regurgitation. No evidence of mitral stenosis.   4. The aortic valve  is grossly normal. Aortic valve regurgitation is not visualized. No aortic stenosis is present.   5. The inferior vena cava is normal in size with greater than 50% respiratory variability, suggesting right atrial pressure of 3 mmHg.   LABS REVIEWED TODAY  11-20-20: wbc 15.6; hgb 13.5; hct 41.1; mcv 99.0 plt 151; glucose 138; bun 26; creat 0.93; k+ 3.4; na++ 136; ca 8.8GFR >60; urine culture: no growth  11-21-20: mag 1.3; pohs 2.2; liver normal albumin 3.5 11-23-20: wbc 9.9; hgb 10.0; hct 30.4 mcv 100.7 plt 138; glucose 123; bun 20; creat 0.84; k+ 4.4; na++ 135; ca 8.3 GFR>60 11-25-20: wbc 6.3; hgb 10.5; hct 31.4 mcv 99.1 plt 162; glucose 101; bun 12; creat  0.60 ;k+ 3.1;na++ 136 ca 8.8; GFR >60 11-26-20: glucose 103; bun 11; creat 0.60; k+ 3.5; na++ 131; ca 8.3; GFR >60; mag 2.2   Review of Systems  Constitutional: Negative for malaise/fatigue.  Respiratory: Negative for cough and shortness of breath.   Cardiovascular: Negative for chest pain, palpitations and leg swelling.  Gastrointestinal: Negative for abdominal pain, constipation and heartburn.  Musculoskeletal: Positive for joint pain. Negative for back pain and myalgias.       Left hip pain  Skin: Negative.   Neurological: Negative for dizziness.  Psychiatric/Behavioral: The patient is not nervous/anxious.    Physical Exam Constitutional:      General: She is not in acute distress.    Appearance: She is underweight and well-nourished. She is not diaphoretic.  Neck:     Thyroid: No thyromegaly.  Cardiovascular:     Rate and Rhythm: Normal rate and regular rhythm.     Pulses: Normal pulses and intact distal pulses.     Heart sounds: Normal heart sounds.  Pulmonary:     Effort: Pulmonary effort is normal. No respiratory distress.     Breath sounds: Normal breath sounds.  Abdominal:     General: Bowel sounds are normal. There is no distension.     Palpations: Abdomen is soft.     Tenderness: There is no abdominal tenderness.   Musculoskeletal:        General: No edema.     Cervical back: Neck supple.     Right lower leg: No edema.     Left lower leg: No edema.     Comments: Is able to move all extremitie Is status post left femoral neck fracture with total hemiarthroplasty 11-22-20  Lymphadenopathy:     Cervical: No cervical adenopathy.  Skin:    General: Skin is warm and dry.  Neurological:     Mental Status: She is alert and oriented to person, place, and time.  Psychiatric:        Mood and Affect: Mood and affect and mood normal.       ASSESSMENT/ PLAN:  TODAY  1. Closed fracture of left hip sequela: is stable will continue therapy as directed will follow up with orthopedics will continue eliquis 2.5 mg twice daily for 14 days; will continue percocet 5/325 mg every 6 hours as needed   2. acute kidney injury: is stable bun 11; creat 0.60 will monitor   3. Chronic obstructive pulmonary disease unspecified copd type/ malignant neoplasm of left lung unspecified portion of lung: she has not wanted a lung cancer workup in the past.   4. GERD without esophagitis: is stable will continue pepcid 40 mg daily   5. Chronic constipation: is stable will continue senna s 2 tabs twice daily   6. Protein calorie malnutrition severe: is without change will continue supplements as directed  7.Major depression recurrent chronic: is stable will continue effexor xr 112.5 mg daily celexa 30 mg daily  ativan 0.5 mg every 4 hours as needed   8. Essential hypertension: is table 135/81 will continue norvasc 5 mg daily  9. Neuropathic pain: is stable will continue elevil 10 mg nightly gabapentin 300 mg in the AM and 600 mg in the PM  10 vitamin D deficiency will continue vit D 50,000 units weekly for 4 weeks.      MD is aware of resident's narcotic use and is in agreement with current plan of care. We will attempt to wean resident as appropriate.  Ok Edwards NP  [place] [phone]  Monday through Friday 8am- 5pm  After hours call [phone]

## 2020-11-28 NOTE — Telephone Encounter (Signed)
Called pt. Per TOC unable to LM pt. Recently in the hospital for hip injury not sure if pt. Still goes here she has a [place] address.

## 2020-11-29 ENCOUNTER — Other Ambulatory Visit: Payer: Self-pay | Admitting: Internal Medicine

## 2020-11-29 MED ORDER — OXYCODONE-ACETAMINOPHEN 5-325 MG PO TABS: 1.0000 | ORAL_TABLET | Freq: Four times a day (QID) | ORAL | 0 refills | Status: AC | PRN

## 2020-12-01 DIAGNOSIS — I10 Essential (primary) hypertension: Secondary | ICD-10-CM | POA: Insufficient documentation

## 2020-12-01 DIAGNOSIS — K219 Gastro-esophageal reflux disease without esophagitis: Secondary | ICD-10-CM | POA: Insufficient documentation

## 2020-12-01 DIAGNOSIS — F339 Major depressive disorder, recurrent, unspecified: Secondary | ICD-10-CM | POA: Insufficient documentation

## 2020-12-01 DIAGNOSIS — M792 Neuralgia and neuritis, unspecified: Secondary | ICD-10-CM | POA: Insufficient documentation

## 2020-12-01 DIAGNOSIS — K5909 Other constipation: Secondary | ICD-10-CM | POA: Insufficient documentation

## 2020-12-01 DIAGNOSIS — G893 Neoplasm related pain (acute) (chronic): Secondary | ICD-10-CM | POA: Insufficient documentation

## 2020-12-05 NOTE — Progress Notes (Signed)
Provider:  Rexene Edison. Mariea Clonts, D.O., C.M.D. Location:  [facility] [place] Room Number: 353G Place of Service:  SNF (31)   PCP: Girtha Rm, NP-C Patient Care Team: Girtha Rm, NP-C as PCP - General (Family Medicine)  Extended Emergency Contact Information Primary Emergency Contact: ESTRIDGE,DENISE Address: [address] [place] [address] Johnnette Litter of [place] Phone: [phone] Relation: Sister Secondary Emergency Contact: Cristel, Rail Mobile Phone: [phone] Relation: Significant other  Code Status: DNR Goals of Care: Advanced Directive information Advanced Directives 12/06/2020  Does Patient Have a Medical Advance Directive? Yes  Type of Advance Directive Out of facility DNR (pink MOST or yellow form)  Does patient want to make changes to medical advance directive? No - Patient declined  Would patient like information on creating a medical advance directive? No - Patient declined  Pre-existing out of facility DNR order (yellow form or pink MOST form) Yellow form placed in chart (order not valid for inpatient use)   Chief Complaint  Patient presents with  . New Admit To SNF    New Admit for [facility]     HPI: Patient is a 75 y.o. female with PMH significant for lung ca, COPD, depression, GERD, chronic constipation, and malnutrition seen today for admission to [facility] s/p hospitalization from 1/19-26 with mechanical fall at home that resulted in nondisplaced left femoral neck fx for which she underwent anterior hip arthroplasty on 11/22/20 by Dr. Lyla Glassing.  She's here for short-term rehab with goal to return home. She is WBAT with a walker.  DVT prophylaxis is a 14 day course of eliquis.  Pain mgt is percocet, ibuprofen per ortho.  She was started on vitamin D supplementation.    She also had an echo done that revealed a possible mass on her tricuspid annulus and she and her sister did not want  this worked up further.    It appears she was also started on amlodipine for htn.    Bowels were moving when seen by NP on 1/27.  On senna-s.  In terms of her lung cancer history, further workup has been declined per d/c summary.    She had acute kidney injury that resolved.     Today, when seen, she was doing ok.  She'd had a good bm.  She denied much pain outside of a little aching here and there.  She was disappointed that she didn't get more therapy b/c of having to use the bathroom when she did.    Past Medical History:  Diagnosis Date  . Anxiety   . Chronic pain   . COPD (chronic obstructive pulmonary disease) ([place])   . Depression   . GERD (gastroesophageal reflux disease)   . Lung cancer [facility])    Past Surgical History:  Procedure Laterality Date  . ANTERIOR APPROACH HEMI HIP ARTHROPLASTY Left 11/22/2020   Procedure: ANTERIOR APPROACH HIP ARTHROPLASTY;  Surgeon: Rod Can, MD;  Location: [facility] ORS;  Service: Orthopedics;  Laterality: Left;  . BACK SURGERY    . HERNIA REPAIR    . TONSILLECTOMY      Social History   Socioeconomic History  . Marital status: Married    Spouse name: Not on file  . Number of children: Not on file  . Years of education: Not on file  . Highest education level: Not on file  Occupational History  . Not on file  Tobacco Use  . Smoking status: Current Every Day Smoker    Packs/day: 1.00    Years: 55.00    Pack years: 55.00    Types: Cigarettes  . Smokeless tobacco: Never Used  Substance and Sexual Activity  . Alcohol use: Yes    Comment: 2 beers a night  . Drug use: Never  . Sexual activity: Not on file  Other Topics Concern  . Not on file  Social History Narrative  . Not on file   Social Determinants of Health   Financial Resource Strain: Not on file  Food Insecurity: Not on file  Transportation Needs: Not on file  Physical Activity: Not on file  Stress: Not on file  Social Connections: Not on file    reports that she  has been smoking cigarettes. She has a 55.00 pack-year smoking history. She has never used smokeless tobacco. She reports current alcohol use. She reports that she does not use drugs.  Functional Status Survey:    Family History  Problem Relation Age of Onset  . Hypertension Other     Health Maintenance  Topic Date Due  . Hepatitis C Screening  Never done  . TETANUS/TDAP  Never done  . COLONOSCOPY (Pts 45-30yrs Insurance coverage will need to be confirmed)  Never done  . DEXA SCAN  Never done  . PNA vac Low Risk Adult (1 of 2 - PCV13) Never done  . INFLUENZA VACCINE  Completed  . COVID-19 Vaccine  Completed    Allergies  Allergen Reactions  . Penicillin G Rash    Patient tolerated ANCEF 2 grams on 11/22/20    Outpatient Encounter Medications as of 12/06/2020  Medication Sig  . amitriptyline (ELAVIL) 10 MG tablet Take 1 tablet (10 mg total) by mouth at bedtime.  Marland Kitchen amLODipine (NORVASC) 5 MG tablet Take 1 tablet (5 mg total) by mouth daily.  Marland Kitchen apixaban (ELIQUIS) 2.5 MG TABS tablet Take 1 tablet (2.5 mg total) by mouth 2 (two) times daily for 14 days.  . citalopram (CELEXA) 20 MG tablet Take 1.5 tablets (30 mg total) by mouth daily. Taking 1 & 1/2 tab daily = 30 mg  . famotidine (PEPCID) 40 MG tablet Take 1 tablet (40 mg total) by mouth daily for 30 doses.  Marland Kitchen gabapentin (NEURONTIN) 300 MG capsule Take 1-2 capsules (300-600 mg total) by mouth See admin instructions. Taking 300 mg in the Am and 600 mg at bedtime  . ibuprofen (ADVIL) 200 MG tablet Take 400 mg by mouth every 6 (six) hours as needed for mild pain.  Marland Kitchen LORazepam (ATIVAN) 0.5 MG tablet Take 0.5 mg by mouth every 4 (four) hours as needed for anxiety.  . senna-docusate (SENOKOT-S) 8.6-50 MG tablet Take 2 tablets by mouth 2 (two) times daily.  Marland Kitchen venlafaxine XR (EFFEXOR-XR) 37.5 MG 24 hr capsule Take 1 capsule (37.5 mg total) by mouth daily. Taking with 75 mg daily = 112.5 mg  . venlafaxine XR (EFFEXOR-XR) 75 MG 24 hr capsule  Take 1 capsule (75 mg total) by mouth daily with breakfast. Taking with 37.5 mg daily = 112.5 mg  . Vitamin D, Ergocalciferol, (DRISDOL) 1.25 MG (50000 UNIT) CAPS capsule Take 1 capsule (50,000 Units total) by mouth every 7 (seven) days for 4 doses.   No facility-administered encounter medications on file as of 12/06/2020.    Review of Systems  Constitutional: Negative for chills, fever and malaise/fatigue.  HENT: Negative for congestion, hearing loss and sore throat.   Eyes: Negative for  blurred vision.  Respiratory: Negative for cough and shortness of breath.   Cardiovascular: Negative for chest pain, palpitations and leg swelling.  Gastrointestinal: Negative for abdominal pain, blood in stool, constipation and melena.  Genitourinary: Negative for dysuria.  Musculoskeletal: Positive for joint pain. Negative for falls.       Minimal  Skin: Negative for itching and rash.  Neurological: Negative for dizziness and loss of consciousness.  Endo/Heme/Allergies: Bruises/bleeds easily.  Psychiatric/Behavioral: Negative for depression and memory loss. The patient is not nervous/anxious and does not have insomnia.     Vitals:   12/06/20 0916  BP: 121/70  Pulse: 83  Temp: 98.3 F (36.8 C)  Weight: 94 lb 3.2 oz (42.7 kg)  Height: 6' (1.829 m)   Body mass index is 12.78 kg/m. Physical Exam Vitals reviewed.  Constitutional:      General: She is not in acute distress.    Appearance: Normal appearance. She is normal weight. She is not toxic-appearing.  HENT:     Head: Normocephalic and atraumatic.     Right Ear: External ear normal.     Left Ear: External ear normal.     Nose: Nose normal.     Mouth/Throat:     Pharynx: Oropharynx is clear.  Eyes:     Extraocular Movements: Extraocular movements intact.     Pupils: Pupils are equal, round, and reactive to light.  Cardiovascular:     Rate and Rhythm: Normal rate and regular rhythm.     Heart sounds: Murmur heard.    Pulmonary:      Effort: Pulmonary effort is normal.     Breath sounds: Normal breath sounds.  Abdominal:     General: Bowel sounds are normal.     Palpations: Abdomen is soft.  Musculoskeletal:        General: Normal range of motion.     Right lower leg: No edema.     Left lower leg: No edema.  Skin:    General: Skin is warm and dry.     Comments: Incisions w/o erythema, warmth or drainage  Neurological:     General: No focal deficit present.     Mental Status: She is alert and oriented to person, place, and time.  Psychiatric:        Mood and Affect: Mood normal.        Behavior: Behavior normal.     Labs reviewed: Basic Metabolic Panel: Recent Labs    11/21/20 0145 11/21/20 0444 11/24/20 0319 11/25/20 0252 11/26/20 0332  NA  --    < > 134* 136 131*  K  --    < > 3.4* 3.1* 3.5  CL  --    < > 102 100 101  CO2  --    < > 24 26 21*  GLUCOSE  --    < > 85 101* 103*  BUN  --    < > 26* 12 11  CREATININE  --    < > 0.94 0.60 0.60  CALCIUM  --    < > 8.0* 8.8* 8.3*  MG 1.3*  --   --   --  2.2  PHOS 2.2*  --   --   --   --    < > = values in this interval not displayed.   Liver Function Tests: Recent Labs    11/21/20 0145 11/21/20 0444 11/22/20 0553  AST 27 23 21   ALT 29 25 23   ALKPHOS 103 88 82  BILITOT  1.0 0.9 0.9  PROT 6.1* 5.3* 5.8*  ALBUMIN 3.5 3.0* 3.3*   No results for input(s): LIPASE, AMYLASE in the last 8760 hours. Recent Labs    11/21/20 0145  AMMONIA 15   CBC: Recent Labs    11/12/20 2022 11/13/20 0613 11/15/20 0505 11/20/20 2334 11/21/20 0444 11/23/20 0232 11/24/20 0319 11/25/20 0252  WBC 17.2*   < > 8.4 15.6*   < > 9.9 6.8 6.3  NEUTROABS 15.8*  --  5.3 13.7*  --   --   --   --   HGB 15.2*   < > 11.6* 13.5   < > 10.0* 9.0* 10.5*  HCT 46.6*   < > 33.7* 41.1   < > 30.4* 27.6* 31.4*  MCV 99.4   < > 96.3 99.0   < > 100.7* 100.7* 99.1  PLT 380   < > 221 151   < > 138* 141* 162   < > = values in this interval not displayed.   Cardiac Enzymes: Recent  Labs    11/21/20 0145  CKTOTAL 210   BNP: Invalid input(s): POCBNP No results found for: HGBA1C No results found for: TSH No results found for: VITAMINB12 No results found for: FOLATE No results found for: IRON, TIBC, FERRITIN  Imaging and Procedures obtained prior to SNF admission: CT HEAD WO CONTRAST  Result Date: 11/21/2020 CLINICAL DATA:  Patient fall at home EXAM: CT HEAD WITHOUT CONTRAST TECHNIQUE: Contiguous axial images were obtained from the base of the skull through the vertex without intravenous contrast. COMPARISON:  November 14, 2020 FINDINGS: Brain: No evidence of acute territorial infarction, hemorrhage, hydrocephalus,extra-axial collection or mass lesion/mass effect. There is dilatation the ventricles and sulci consistent with age-related atrophy. Low-attenuation changes in the deep white matter consistent with small vessel ischemia. Vascular: No hyperdense vessel or unexpected calcification. Skull: The skull is intact. No fracture or focal lesion identified. Sinuses/Orbits: The visualized paranasal sinuses and mastoid air cells are clear. The orbits and globes intact. Other: None IMPRESSION: No acute intracranial abnormality. Findings consistent with age related atrophy and chronic small vessel ischemia Electronically Signed   By: Prudencio Pair M.D.   On: 11/21/2020 02:44   CT PELVIS WO CONTRAST  Result Date: 11/21/2020 CLINICAL DATA:  Hip pain after fall EXAM: CT PELVIS WITHOUT CONTRAST TECHNIQUE: Multidetector CT imaging of the pelvis was performed following the standard protocol without intravenous contrast. COMPARISON:  None. FINDINGS: Urinary Tract: The visualized distal ureters and bladder appear unremarkable. Bowel: No bowel wall thickening, distention or surrounding inflammation identified within the pelvis. Vascular/Lymphatic: No enlarged pelvic lymph nodes identified. Scattered aortic atherosclerosis is noted. Reproductive: The uterus and adnexa are unremarkable. Other:  There is a soft tissue calcifications seen overlying the left inferior ischial tuberosity. Musculoskeletal: There is a comminuted slightly impacted nondisplaced fracture seen through the left transcervical femoral neck. The femoral head is still well seated within the acetabulum. A small hip joint effusion is seen. There is diffuse osteopenia. Again noted are degenerative changes in the lower lumbar spine. The sacroiliac joints appear to be intact. IMPRESSION: Comminuted impacted nondisplaced transcervical left femoral neck fracture. Electronically Signed   By: Prudencio Pair M.D.   On: 11/21/2020 00:13   DG CHEST PORT 1 VIEW  Result Date: 11/21/2020 CLINICAL DATA:  Hypoxia EXAM: PORTABLE CHEST 1 VIEW COMPARISON:  November 13, 2020 FINDINGS: The heart size and mediastinal contours are mildly enlarged. Aortic knob calcifications are seen. Again noted is increased interstitial markings with fibrosis and  scarring at the right lung apex. A unchanged small left pleural effusion is seen. No acute osseous abnormality. IMPRESSION: No significant change with the right apical scarring and fibrosis Small left pleural effusion Electronically Signed   By: Prudencio Pair M.D.   On: 11/21/2020 00:16   DG Knee Left Port  Result Date: 11/21/2020 CLINICAL DATA:  Fall. EXAM: PORTABLE LEFT KNEE - 1-2 VIEW COMPARISON:  No prior. FINDINGS: Diffuse osteopenia. Mild degenerative change with chondrocalcinosis. No acute bony abnormality. No evidence of fracture dislocation. No prominent effusion. IMPRESSION: Diffuse osteopenia. Mild degenerative change with chondrocalcinosis. No acute abnormality identified. Electronically Signed   By: Marcello Moores  Register   On: 11/21/2020 08:43   ECHOCARDIOGRAM COMPLETE  Result Date: 11/21/2020    ECHOCARDIOGRAM REPORT   Patient Name:   JULIETT EASTBURN Date of Exam: 11/21/2020 Medical Rec #:  [id number]        Height:       68.7 in Accession #:    [id number]       Weight:       100.0 lb Date of Birth:   10/11/1946        BSA:          1.534 m Patient Age:    55 years         BP:           197/93 mmHg Patient Gender: F                HR:           89 bpm. Exam Location:  Inpatient Procedure: 2D Echo Indications:    Abnormal ECG R94.31  History:        Patient has no prior history of Echocardiogram examinations.                 COPD.  Sonographer:    Mikki Santee RDCS (AE) Referring Phys: [place]  1. Left ventricular ejection fraction, by estimation, is 65 to 70%. The left ventricle has hyperdynamic function. The left ventricle has no regional wall motion abnormalities. Left ventricular diastolic parameters were normal.  2. Right ventricular systolic function is normal. The right ventricular size is normal. There is mildly elevated pulmonary artery systolic pressure.  3. The mitral valve is normal in structure. No evidence of mitral valve regurgitation. No evidence of mitral stenosis.  4. The aortic valve is grossly normal. Aortic valve regurgitation is not visualized. No aortic stenosis is present.  5. The inferior vena cava is normal in size with greater than 50% respiratory variability, suggesting right atrial pressure of 3 mmHg. Comparison(s): No prior Echocardiogram. Conclusion(s)/Recommendation(s): There is an abnormal structure on the tricuspid annulus (vs adjacent RA/RV). This is seen well on images 76 and 77, however not well appreciated in other views. Could consider additional imaging such as cardiac MRI if it would change clinical management. Findings communicated to Dr. Darrick Meigs at 2:38 PM. FINDINGS  Left Ventricle: LVOT peak gradient 32 mmHg. Left ventricular ejection fraction, by estimation, is 65 to 70%. The left ventricle has hyperdynamic function. The left ventricle has no regional wall motion abnormalities. The left ventricular internal cavity  size was normal in size. There is no left ventricular hypertrophy. Left ventricular diastolic parameters were normal. Right  Ventricle: The right ventricular size is normal. Right vetricular wall thickness was not well visualized. Right ventricular systolic function is normal. There is mildly elevated pulmonary artery systolic pressure. The tricuspid regurgitant velocity  is 2.93 m/s, and  with an assumed right atrial pressure of 3 mmHg, the estimated right ventricular systolic pressure is 52.8 mmHg. Left Atrium: Left atrial size was normal in size. Right Atrium: Right atrial size was not well visualized. Prominent Eustachian valve. Pericardium: Trivial pericardial effusion is present. Mitral Valve: The mitral valve is normal in structure. No evidence of mitral valve regurgitation. No evidence of mitral valve stenosis. Tricuspid Valve: The tricuspid valve is normal in structure. Tricuspid valve regurgitation is mild. Aortic Valve: The aortic valve is grossly normal. Aortic valve regurgitation is not visualized. No aortic stenosis is present. Pulmonic Valve: The pulmonic valve was not well visualized. Pulmonic valve regurgitation is not visualized. Aorta: The aortic root and ascending aorta are structurally normal, with no evidence of dilitation. Venous: The inferior vena cava is normal in size with greater than 50% respiratory variability, suggesting right atrial pressure of 3 mmHg. IAS/Shunts: The interatrial septum was not well visualized.  LEFT VENTRICLE PLAX 2D LVIDd:         2.40 cm  Diastology LVIDs:         1.60 cm  LV e' medial:    7.18 cm/s LV PW:         0.90 cm  LV E/e' medial:  11.4 LV IVS:        0.80 cm  LV e' lateral:   8.81 cm/s LVOT diam:     1.80 cm  LV E/e' lateral: 9.3 LV SV:         53 LV SV Index:   34 LVOT Area:     2.54 cm  RIGHT VENTRICLE TAPSE (M-mode): 2.0 cm LEFT ATRIUM             Index LA diam:        2.20 cm 1.43 cm/m LA Vol (A2C):   15.3 ml 9.97 ml/m LA Vol (A4C):   15.3 ml 9.97 ml/m LA Biplane Vol: 16.9 ml 11.01 ml/m  AORTIC VALVE LVOT Vmax:   134.00 cm/s LVOT Vmean:  90.700 cm/s LVOT VTI:    0.208 m   AORTA Ao Root diam: 2.30 cm MITRAL VALVE                TRICUSPID VALVE MV Area (PHT): 4.29 cm     TR Peak grad:   34.3 mmHg MV Decel Time: 177 msec     TR Vmax:        293.00 cm/s MV E velocity: 81.70 cm/s MV A velocity: 152.00 cm/s  SHUNTS MV E/A ratio:  0.54         Systemic VTI:  0.21 m                             Systemic Diam: 1.80 cm Buford Dresser MD Electronically signed by Buford Dresser MD Signature Date/Time: 11/21/2020/2:39:37 PM    Final    DG HIP PORT UNILAT WITH PELVIS 1V LEFT  Result Date: 11/21/2020 CLINICAL DATA:  Fall yesterday with hip injury EXAM: DG HIP (WITH OR WITHOUT PELVIS) 1V PORT LEFT COMPARISON:  None. FINDINGS: Limited study due to foreshortening of the left femoral neck. There are findings of a nondisplaced femoral neck fracture but need confirmation due to limited projections. No dislocation. No evidence of pelvic ring fracture or diastasis. IMPRESSION: Findings of nondisplaced left femoral neck fracture. Need confirmatory radiographic or CT imaging given rotation and foreshortening of the femoral neck on all of the provided images. Electronically Signed  By: Monte Fantasia M.D.   On: 11/21/2020 08:41    Assessment/Plan 1. Closed fracture of left hip, sequela -s/p ORIF  2. S/P ORIF (open reduction internal fixation) fracture -cont PT, OT -goal to return home   3. Acute kidney injury ([place]) -resolved with hydration--encouraged water here--likes tea   4. Chronic obstructive pulmonary disease, unspecified COPD type ([place]) -no shortness of breath, cont home regimen  5. Malignant neoplasm of left lung, unspecified part of lung ([place]) -elected no further investigations, txs  6. GERD without esophagitis -cont current regimen and monitor  7. Chronic constipation -bowels moving with current regimen  8. Protein-calorie malnutrition, severe -encouraged po intake, sister had brought them some lunch from outside  9. Major depression, recurrent,  chronic ([facility]) -appears to be in good spirits, cont home regimen with effexor and oddly also celexa and amitriptyline--monitor for serotonin syndrome and signs of prolonged QT with this combination  10. Essential hypertension -bp at goal, cont same regimen and monitor  11. Neuropathic pain -cont home gabapentin therapy  Family/ staff Communication: d/w snf nurse  Labs/tests ordered:  No new added today  Milena Liggett L. Zacchaeus Halm, D.O. [facility] [address], [place] [address] Cell Phone (Mon-Fri 8am-5pm):  [phone] On Call:  [phone] & follow prompts after 5pm & weekends Office Phone:  [phone] Office Fax:  [phone]

## 2020-12-06 ENCOUNTER — Non-Acute Institutional Stay (SKILLED_NURSING_FACILITY): Payer: Federal, State, Local not specified - PPO | Admitting: Internal Medicine

## 2020-12-06 ENCOUNTER — Encounter: Payer: Self-pay | Admitting: Internal Medicine

## 2020-12-06 DIAGNOSIS — F339 Major depressive disorder, recurrent, unspecified: Secondary | ICD-10-CM

## 2020-12-06 DIAGNOSIS — K219 Gastro-esophageal reflux disease without esophagitis: Secondary | ICD-10-CM

## 2020-12-06 DIAGNOSIS — K5909 Other constipation: Secondary | ICD-10-CM

## 2020-12-06 DIAGNOSIS — Z9889 Other specified postprocedural states: Secondary | ICD-10-CM | POA: Diagnosis not present

## 2020-12-06 DIAGNOSIS — J449 Chronic obstructive pulmonary disease, unspecified: Secondary | ICD-10-CM | POA: Diagnosis not present

## 2020-12-06 DIAGNOSIS — C3492 Malignant neoplasm of unspecified part of left bronchus or lung: Secondary | ICD-10-CM

## 2020-12-06 DIAGNOSIS — S72002S Fracture of unspecified part of neck of left femur, sequela: Secondary | ICD-10-CM | POA: Diagnosis not present

## 2020-12-06 DIAGNOSIS — N179 Acute kidney failure, unspecified: Secondary | ICD-10-CM | POA: Diagnosis not present

## 2020-12-06 DIAGNOSIS — I10 Essential (primary) hypertension: Secondary | ICD-10-CM

## 2020-12-06 DIAGNOSIS — E43 Unspecified severe protein-calorie malnutrition: Secondary | ICD-10-CM

## 2020-12-06 DIAGNOSIS — M792 Neuralgia and neuritis, unspecified: Secondary | ICD-10-CM

## 2020-12-06 DIAGNOSIS — Z8781 Personal history of (healed) traumatic fracture: Secondary | ICD-10-CM

## 2020-12-11 ENCOUNTER — Encounter: Payer: Self-pay | Admitting: Family Medicine

## 2020-12-19 ENCOUNTER — Encounter: Payer: Self-pay | Admitting: Orthopedic Surgery

## 2020-12-19 ENCOUNTER — Non-Acute Institutional Stay (SKILLED_NURSING_FACILITY): Payer: Federal, State, Local not specified - PPO | Admitting: Orthopedic Surgery

## 2020-12-19 DIAGNOSIS — I10 Essential (primary) hypertension: Secondary | ICD-10-CM

## 2020-12-19 DIAGNOSIS — N179 Acute kidney failure, unspecified: Secondary | ICD-10-CM

## 2020-12-19 DIAGNOSIS — E43 Unspecified severe protein-calorie malnutrition: Secondary | ICD-10-CM

## 2020-12-19 DIAGNOSIS — J449 Chronic obstructive pulmonary disease, unspecified: Secondary | ICD-10-CM | POA: Diagnosis not present

## 2020-12-19 DIAGNOSIS — Z8781 Personal history of (healed) traumatic fracture: Secondary | ICD-10-CM

## 2020-12-19 DIAGNOSIS — K5909 Other constipation: Secondary | ICD-10-CM

## 2020-12-19 DIAGNOSIS — Z9889 Other specified postprocedural states: Secondary | ICD-10-CM | POA: Diagnosis not present

## 2020-12-19 DIAGNOSIS — F419 Anxiety disorder, unspecified: Secondary | ICD-10-CM

## 2020-12-19 DIAGNOSIS — K219 Gastro-esophageal reflux disease without esophagitis: Secondary | ICD-10-CM

## 2020-12-19 DIAGNOSIS — F339 Major depressive disorder, recurrent, unspecified: Secondary | ICD-10-CM

## 2020-12-19 DIAGNOSIS — C3492 Malignant neoplasm of unspecified part of left bronchus or lung: Secondary | ICD-10-CM

## 2020-12-19 MED ORDER — VENLAFAXINE HCL ER 37.5 MG PO CP24: 37.5000 mg | ORAL_CAPSULE | Freq: Every day | ORAL | 0 refills | Status: AC

## 2020-12-19 MED ORDER — GABAPENTIN 300 MG PO CAPS
300.0000 mg | ORAL_CAPSULE | ORAL | 0 refills | Status: AC
Start: 2020-12-19 — End: ?

## 2020-12-19 MED ORDER — CITALOPRAM HYDROBROMIDE 20 MG PO TABS
30.0000 mg | ORAL_TABLET | Freq: Every day | ORAL | 0 refills | Status: AC
Start: 2020-12-19 — End: 2021-01-18

## 2020-12-19 MED ORDER — LORAZEPAM 0.5 MG PO TABS: 0.5000 mg | ORAL_TABLET | ORAL | 0 refills | Status: AC | PRN

## 2020-12-19 MED ORDER — VENLAFAXINE HCL ER 75 MG PO CP24: 75.0000 mg | ORAL_CAPSULE | Freq: Every day | ORAL | 0 refills | Status: AC

## 2020-12-19 MED ORDER — VITAMIN D (ERGOCALCIFEROL) 1.25 MG (50000 UNIT) PO CAPS: 50000.0000 [IU] | ORAL_CAPSULE | ORAL | 0 refills | Status: AC

## 2020-12-19 MED ORDER — AMLODIPINE BESYLATE 5 MG PO TABS: 5.0000 mg | ORAL_TABLET | Freq: Every day | ORAL | 0 refills | Status: AC

## 2020-12-19 MED ORDER — AMITRIPTYLINE HCL 10 MG PO TABS: 10.0000 mg | ORAL_TABLET | Freq: Every day | ORAL | 0 refills | Status: AC

## 2020-12-19 MED ORDER — FAMOTIDINE 40 MG PO TABS: 40.0000 mg | ORAL_TABLET | Freq: Every day | ORAL | 0 refills | Status: AC

## 2020-12-19 NOTE — Progress Notes (Addendum)
Location:  [facility] Room Number: 106/P Place of Service:  SNF (31) Provider: Windell Moulding, AGNP-C  Girtha Rm, NP-C  Patient Care Team: Girtha Rm, NP-C as PCP - General (Family Medicine)  Extended Emergency Contact Information Primary Emergency Contact: [facility] Address: [address] [place] [address] Johnnette Litter of [place] Phone: [phone] Relation: Sister Secondary Emergency Contact: Laniyah, [place] Mobile Phone: [phone] Relation: Significant other  Goals of care: Advanced Directive information Advanced Directives 12/06/2020  Does Patient Have a Medical Advance Directive? Yes  Type of Advance Directive Out of facility DNR (pink MOST or yellow form)  Does patient want to make changes to medical advance directive? No - Patient declined  Would patient like information on creating a medical advance directive? No - Patient declined  Pre-existing out of facility DNR order (yellow form or pink MOST form) Yellow form placed in chart (order not valid for inpatient use)     Chief Complaint  Patient presents with  . Discharge Note    HPI:  Pt is a 75 y.o. female seen today for discharge evaluation.   She has been a resident of [facility] since 01/26. PMH includes: hypertension, COPD, lung cancer, chronic constipation, GERD, and depression.   Originally from [place], she was visiting her sister when incident occurred. She was hospitalized 01/19- 01/26 due to mechanical fall at home that resulted in nondisplaced left femoral hip fracture. 01/21 she underwent left anterior hip arthroplasty by Dr. Rod Can ([facility], [place]). She was discharged to snf for additional PT/OT.   During her stay at [facility], she has received PT/OT and skilled nursing services. At this time, she is ambulating about 40 ft with walker. Remains WBAT. Minimal assist with ADL's. Minimal pain  with ambulation or activity. Not requiring narcotics. Having regular bowel movements. She has completed 2 weeks eliquis for dvt prophylaxis.   02/05 she was found by staff on the floor in her room. No injuries occurred, vitals stable. She did complain of some left knee pain after incident. Left knee xray with osteoarthritis. Seen by orthopedics 02/07 they advised 4 week f/u, no other recommendations.   Today, she is alert and oriented x 3. Follows commands and can express needs. She is excited to go home Saturday. She claims her appetite is fair. Denies sob, chest pain or calf pain.    At this time she plans to discharge home 02/20. [facility] will be assisting with transportation. Husband will assist with care at home. Home health PT/OT/ Nursing ordered. Lightweight wheelchair with cushion and semi-electric hospital bed ordered. I have advised her to follow up with PCP in 1-2 weeks aftre discharge for exam and lab work.   Past Medical History:  Diagnosis Date  . Anxiety   . Chronic pain   . COPD (chronic obstructive pulmonary disease) ([place])   . Depression   . GERD (gastroesophageal reflux disease)   . Lung cancer [facility])    Past Surgical History:  Procedure Laterality Date  . ANTERIOR APPROACH HEMI HIP ARTHROPLASTY Left 11/22/2020   Procedure: ANTERIOR APPROACH HIP ARTHROPLASTY;  Surgeon: Rod Can, MD;  Location: [facility] ORS;  Service: Orthopedics;  Laterality: Left;  . BACK SURGERY    . HERNIA REPAIR    . TONSILLECTOMY      Allergies  Allergen Reactions  . Penicillin G Rash    Patient tolerated ANCEF  2 grams on 11/22/20    Outpatient Encounter Medications as of 12/19/2020  Medication Sig  . amitriptyline (ELAVIL) 10 MG tablet Take 1 tablet (10 mg total) by mouth at bedtime.  Marland Kitchen amLODipine (NORVASC) 5 MG tablet Take 1 tablet (5 mg total) by mouth daily.  Marland Kitchen apixaban (ELIQUIS) 2.5 MG TABS tablet Take 1 tablet (2.5 mg total) by mouth 2 (two) times daily for 14 days.  .  citalopram (CELEXA) 20 MG tablet Take 1.5 tablets (30 mg total) by mouth daily. Taking 1 & 1/2 tab daily = 30 mg  . famotidine (PEPCID) 40 MG tablet Take 1 tablet (40 mg total) by mouth daily for 30 doses.  Marland Kitchen gabapentin (NEURONTIN) 300 MG capsule Take 1-2 capsules (300-600 mg total) by mouth See admin instructions. Taking 300 mg in the Am and 600 mg at bedtime  . ibuprofen (ADVIL) 200 MG tablet Take 400 mg by mouth every 6 (six) hours as needed for mild pain.  Marland Kitchen LORazepam (ATIVAN) 0.5 MG tablet Take 0.5 mg by mouth every 4 (four) hours as needed for anxiety.  . senna-docusate (SENOKOT-S) 8.6-50 MG tablet Take 2 tablets by mouth 2 (two) times daily.  Marland Kitchen venlafaxine XR (EFFEXOR-XR) 37.5 MG 24 hr capsule Take 1 capsule (37.5 mg total) by mouth daily. Taking with 75 mg daily = 112.5 mg  . venlafaxine XR (EFFEXOR-XR) 75 MG 24 hr capsule Take 1 capsule (75 mg total) by mouth daily with breakfast. Taking with 37.5 mg daily = 112.5 mg  . Vitamin D, Ergocalciferol, (DRISDOL) 1.25 MG (50000 UNIT) CAPS capsule Take 1 capsule (50,000 Units total) by mouth every 7 (seven) days for 4 doses.   No facility-administered encounter medications on file as of 12/19/2020.    Review of Systems  Constitutional: Negative for activity change, appetite change and fatigue.       Underweight  HENT: Negative for congestion, dental problem, hearing loss and trouble swallowing.   Eyes: Negative for visual disturbance.  Respiratory: Negative for cough, shortness of breath and wheezing.   Cardiovascular: Negative for chest pain and leg swelling.  Gastrointestinal: Negative for abdominal distention, abdominal pain, constipation, diarrhea and nausea.  Genitourinary: Positive for frequency. Negative for dysuria and hematuria.  Musculoskeletal: Positive for arthralgias and myalgias.       Left knee pain  Skin:       Surgical incision  Neurological: Positive for weakness. Negative for dizziness and headaches.  Hematological:  Bruises/bleeds easily.  Psychiatric/Behavioral: Negative for confusion and dysphoric mood. The patient is not nervous/anxious.     Immunization History  Administered Date(s) Administered  . Fluad Quad(high Dose 65+) 10/05/2019  . Influenza-Unspecified 08/02/2020  . Janssen (J&J) SARS-COV-2 Vaccination 01/01/2020  . Unspecified SARS-COV-2 Vaccination 09/02/2020   Pertinent  Health Maintenance Due  Topic Date Due  . COLONOSCOPY (Pts 45-80yrs Insurance coverage will need to be confirmed)  Never done  . DEXA SCAN  Never done  . PNA vac Low Risk Adult (1 of 2 - PCV13) Never done  . INFLUENZA VACCINE  Completed   Fall Risk  10/05/2019  Falls in the past year? 0  Number falls in past yr: 0  Injury with Fall? 0   Functional Status Survey:    Vitals:   12/19/20 1332  BP: (!) 143/73  Pulse: 78  Resp: 18  Temp: 98 F (36.7 C)  Weight: 93 lb (42.2 kg)   Body mass index is 12.61 kg/m. Physical Exam Vitals reviewed.  Constitutional:  General: She is not in acute distress.    Comments: Frail, cachexia  HENT:     Head: Normocephalic.     Right Ear: There is no impacted cerumen.     Left Ear: There is no impacted cerumen.     Nose: Nose normal.     Mouth/Throat:     Mouth: Mucous membranes are moist.  Eyes:     General:        Right eye: No discharge.        Left eye: No discharge.  Cardiovascular:     Rate and Rhythm: Normal rate and regular rhythm.     Pulses: Normal pulses.     Heart sounds: Normal heart sounds. No murmur heard.   Pulmonary:     Effort: Pulmonary effort is normal. No respiratory distress.     Breath sounds: Normal breath sounds. No wheezing.  Abdominal:     General: Abdomen is flat. Bowel sounds are normal. There is no distension.     Palpations: Abdomen is soft.     Tenderness: There is no abdominal tenderness.  Musculoskeletal:     Cervical back: Normal range of motion.     Right lower leg: No edema.     Left lower leg: No edema.   Lymphadenopathy:     Cervical: No cervical adenopathy.  Skin:    General: Skin is warm and dry.     Capillary Refill: Capillary refill takes less than 2 seconds.     Comments: Left hip incision CDI, open to air, no drainage, surrounding tissue intact.   Neurological:     General: No focal deficit present.     Mental Status: She is alert and oriented to person, place, and time.     Motor: Weakness present.     Gait: Gait abnormal.     Comments: Wheelchair, walker  Psychiatric:        Mood and Affect: Mood normal.        Behavior: Behavior normal.     Labs reviewed: Recent Labs    11/21/20 0145 11/21/20 0444 11/24/20 0319 11/25/20 0252 11/26/20 0332  NA  --    < > 134* 136 131*  K  --    < > 3.4* 3.1* 3.5  CL  --    < > 102 100 101  CO2  --    < > 24 26 21*  GLUCOSE  --    < > 85 101* 103*  BUN  --    < > 26* 12 11  CREATININE  --    < > 0.94 0.60 0.60  CALCIUM  --    < > 8.0* 8.8* 8.3*  MG 1.3*  --   --   --  2.2  PHOS 2.2*  --   --   --   --    < > = values in this interval not displayed.   Recent Labs    11/21/20 0145 11/21/20 0444 11/22/20 0553  AST 27 23 21   ALT 29 25 23   ALKPHOS 103 88 82  BILITOT 1.0 0.9 0.9  PROT 6.1* 5.3* 5.8*  ALBUMIN 3.5 3.0* 3.3*   Recent Labs    11/12/20 2022 11/13/20 0613 11/15/20 0505 11/20/20 2334 11/21/20 0444 11/23/20 0232 11/24/20 0319 11/25/20 0252  WBC 17.2*   < > 8.4 15.6*   < > 9.9 6.8 6.3  NEUTROABS 15.8*  --  5.3 13.7*  --   --   --   --  HGB 15.2*   < > 11.6* 13.5   < > 10.0* 9.0* 10.5*  HCT 46.6*   < > 33.7* 41.1   < > 30.4* 27.6* 31.4*  MCV 99.4   < > 96.3 99.0   < > 100.7* 100.7* 99.1  PLT 380   < > 221 151   < > 138* 141* 162   < > = values in this interval not displayed.   No results found for: TSH No results found for: HGBA1C No results found for: CHOL, HDL, LDLCALC, LDLDIRECT, TRIG, CHOLHDL  Significant Diagnostic Results in last 30 days:  CT HEAD WO CONTRAST  Result Date: 11/21/2020 CLINICAL  DATA:  Patient fall at home EXAM: CT HEAD WITHOUT CONTRAST TECHNIQUE: Contiguous axial images were obtained from the base of the skull through the vertex without intravenous contrast. COMPARISON:  November 14, 2020 FINDINGS: Brain: No evidence of acute territorial infarction, hemorrhage, hydrocephalus,extra-axial collection or mass lesion/mass effect. There is dilatation the ventricles and sulci consistent with age-related atrophy. Low-attenuation changes in the deep white matter consistent with small vessel ischemia. Vascular: No hyperdense vessel or unexpected calcification. Skull: The skull is intact. No fracture or focal lesion identified. Sinuses/Orbits: The visualized paranasal sinuses and mastoid air cells are clear. The orbits and globes intact. Other: None IMPRESSION: No acute intracranial abnormality. Findings consistent with age related atrophy and chronic small vessel ischemia Electronically Signed   By: Prudencio Pair M.D.   On: 11/21/2020 02:44   CT PELVIS WO CONTRAST  Result Date: 11/21/2020 CLINICAL DATA:  Hip pain after fall EXAM: CT PELVIS WITHOUT CONTRAST TECHNIQUE: Multidetector CT imaging of the pelvis was performed following the standard protocol without intravenous contrast. COMPARISON:  None. FINDINGS: Urinary Tract: The visualized distal ureters and bladder appear unremarkable. Bowel: No bowel wall thickening, distention or surrounding inflammation identified within the pelvis. Vascular/Lymphatic: No enlarged pelvic lymph nodes identified. Scattered aortic atherosclerosis is noted. Reproductive: The uterus and adnexa are unremarkable. Other: There is a soft tissue calcifications seen overlying the left inferior ischial tuberosity. Musculoskeletal: There is a comminuted slightly impacted nondisplaced fracture seen through the left transcervical femoral neck. The femoral head is still well seated within the acetabulum. A small hip joint effusion is seen. There is diffuse osteopenia. Again  noted are degenerative changes in the lower lumbar spine. The sacroiliac joints appear to be intact. IMPRESSION: Comminuted impacted nondisplaced transcervical left femoral neck fracture. Electronically Signed   By: Prudencio Pair M.D.   On: 11/21/2020 00:13   Pelvis Portable  Result Date: 11/22/2020 CLINICAL DATA:  Status post left hip arthroplasty. EXAM: PORTABLE PELVIS 1-2 VIEWS COMPARISON:  Same day. FINDINGS: The left acetabular and femoral components appear to be well situated. Expected postoperative changes seen in the surrounding soft tissues. IMPRESSION: Status post left total hip arthroplasty. Electronically Signed   By: Marijo Conception M.D.   On: 11/22/2020 12:19   DG CHEST PORT 1 VIEW  Result Date: 11/21/2020 CLINICAL DATA:  Hypoxia EXAM: PORTABLE CHEST 1 VIEW COMPARISON:  November 13, 2020 FINDINGS: The heart size and mediastinal contours are mildly enlarged. Aortic knob calcifications are seen. Again noted is increased interstitial markings with fibrosis and scarring at the right lung apex. A unchanged small left pleural effusion is seen. No acute osseous abnormality. IMPRESSION: No significant change with the right apical scarring and fibrosis Small left pleural effusion Electronically Signed   By: Prudencio Pair M.D.   On: 11/21/2020 00:16   DG Knee Left Port  Result Date: 11/21/2020 CLINICAL DATA:  Fall. EXAM: PORTABLE LEFT KNEE - 1-2 VIEW COMPARISON:  No prior. FINDINGS: Diffuse osteopenia. Mild degenerative change with chondrocalcinosis. No acute bony abnormality. No evidence of fracture dislocation. No prominent effusion. IMPRESSION: Diffuse osteopenia. Mild degenerative change with chondrocalcinosis. No acute abnormality identified. Electronically Signed   By: Marcello Moores  Register   On: 11/21/2020 08:43   DG C-Arm 1-60 Min-No Report  Result Date: 11/22/2020 Fluoroscopy was utilized by the requesting physician.  No radiographic interpretation.   ECHOCARDIOGRAM COMPLETE  Result Date:  11/21/2020    ECHOCARDIOGRAM REPORT   Patient Name:   SHAQUITA FORT Date of Exam: 11/21/2020 Medical Rec #:  [id number]        Height:       68.7 in Accession #:    [id number]       Weight:       100.0 lb Date of Birth:  10-28-1946        BSA:          1.534 m Patient Age:    81 years         BP:           197/93 mmHg Patient Gender: F                HR:           89 bpm. Exam Location:  Inpatient Procedure: 2D Echo Indications:    Abnormal ECG R94.31  History:        Patient has no prior history of Echocardiogram examinations.                 COPD.  Sonographer:    Mikki Santee RDCS (AE) Referring Phys: [place]  1. Left ventricular ejection fraction, by estimation, is 65 to 70%. The left ventricle has hyperdynamic function. The left ventricle has no regional wall motion abnormalities. Left ventricular diastolic parameters were normal.  2. Right ventricular systolic function is normal. The right ventricular size is normal. There is mildly elevated pulmonary artery systolic pressure.  3. The mitral valve is normal in structure. No evidence of mitral valve regurgitation. No evidence of mitral stenosis.  4. The aortic valve is grossly normal. Aortic valve regurgitation is not visualized. No aortic stenosis is present.  5. The inferior vena cava is normal in size with greater than 50% respiratory variability, suggesting right atrial pressure of 3 mmHg. Comparison(s): No prior Echocardiogram. Conclusion(s)/Recommendation(s): There is an abnormal structure on the tricuspid annulus (vs adjacent RA/RV). This is seen well on images 76 and 77, however not well appreciated in other views. Could consider additional imaging such as cardiac MRI if it would change clinical management. Findings communicated to Dr. Darrick Meigs at 2:38 PM. FINDINGS  Left Ventricle: LVOT peak gradient 32 mmHg. Left ventricular ejection fraction, by estimation, is 65 to 70%. The left ventricle has hyperdynamic function. The  left ventricle has no regional wall motion abnormalities. The left ventricular internal cavity  size was normal in size. There is no left ventricular hypertrophy. Left ventricular diastolic parameters were normal. Right Ventricle: The right ventricular size is normal. Right vetricular wall thickness was not well visualized. Right ventricular systolic function is normal. There is mildly elevated pulmonary artery systolic pressure. The tricuspid regurgitant velocity  is 2.93 m/s, and with an assumed right atrial pressure of 3 mmHg, the estimated right ventricular systolic pressure is 38.7 mmHg. Left Atrium: Left atrial size was normal in size. Right Atrium: Right atrial size  was not well visualized. Prominent Eustachian valve. Pericardium: Trivial pericardial effusion is present. Mitral Valve: The mitral valve is normal in structure. No evidence of mitral valve regurgitation. No evidence of mitral valve stenosis. Tricuspid Valve: The tricuspid valve is normal in structure. Tricuspid valve regurgitation is mild. Aortic Valve: The aortic valve is grossly normal. Aortic valve regurgitation is not visualized. No aortic stenosis is present. Pulmonic Valve: The pulmonic valve was not well visualized. Pulmonic valve regurgitation is not visualized. Aorta: The aortic root and ascending aorta are structurally normal, with no evidence of dilitation. Venous: The inferior vena cava is normal in size with greater than 50% respiratory variability, suggesting right atrial pressure of 3 mmHg. IAS/Shunts: The interatrial septum was not well visualized.  LEFT VENTRICLE PLAX 2D LVIDd:         2.40 cm  Diastology LVIDs:         1.60 cm  LV e' medial:    7.18 cm/s LV PW:         0.90 cm  LV E/e' medial:  11.4 LV IVS:        0.80 cm  LV e' lateral:   8.81 cm/s LVOT diam:     1.80 cm  LV E/e' lateral: 9.3 LV SV:         53 LV SV Index:   34 LVOT Area:     2.54 cm  RIGHT VENTRICLE TAPSE (M-mode): 2.0 cm LEFT ATRIUM             Index LA  diam:        2.20 cm 1.43 cm/m LA Vol (A2C):   15.3 ml 9.97 ml/m LA Vol (A4C):   15.3 ml 9.97 ml/m LA Biplane Vol: 16.9 ml 11.01 ml/m  AORTIC VALVE LVOT Vmax:   134.00 cm/s LVOT Vmean:  90.700 cm/s LVOT VTI:    0.208 m  AORTA Ao Root diam: 2.30 cm MITRAL VALVE                TRICUSPID VALVE MV Area (PHT): 4.29 cm     TR Peak grad:   34.3 mmHg MV Decel Time: 177 msec     TR Vmax:        293.00 cm/s MV E velocity: 81.70 cm/s MV A velocity: 152.00 cm/s  SHUNTS MV E/A ratio:  0.54         Systemic VTI:  0.21 m                             Systemic Diam: 1.80 cm Buford Dresser MD Electronically signed by Buford Dresser MD Signature Date/Time: 11/21/2020/2:39:37 PM    Final    DG HIP PORT UNILAT WITH PELVIS 1V LEFT  Result Date: 11/21/2020 CLINICAL DATA:  Fall yesterday with hip injury EXAM: DG HIP (WITH OR WITHOUT PELVIS) 1V PORT LEFT COMPARISON:  None. FINDINGS: Limited study due to foreshortening of the left femoral neck. There are findings of a nondisplaced femoral neck fracture but need confirmation due to limited projections. No dislocation. No evidence of pelvic ring fracture or diastasis. IMPRESSION: Findings of nondisplaced left femoral neck fracture. Need confirmatory radiographic or CT imaging given rotation and foreshortening of the femoral neck on all of the provided images. Electronically Signed   By: Monte Fantasia M.D.   On: 11/21/2020 08:41   DG HIP OPERATIVE UNILAT W OR W/O PELVIS LEFT  Result Date: 11/22/2020 CLINICAL DATA:  Left hip replacement EXAM:  OPERATIVE left HIP (WITH PELVIS IF PERFORMED) 2 VIEWS TECHNIQUE: Fluoroscopic spot image(s) were submitted for interpretation post-operatively. COMPARISON:  11/21/2020 FINDINGS: Left hip replacement in satisfactory position and alignment. No fracture or complication IMPRESSION: Satisfactory left hip replacement. Electronically Signed   By: Franchot Gallo M.D.   On: 11/22/2020 11:40    Assessment/Plan 1. S/P ORIF (open  reduction internal fixation) fracture - surgical incision no sign of infection - cont WBAT - home health PT/OT - completed eliquis series for dvt prophylaxis - recommend tylenol prn for pain  2. Acute kidney injury ([place]) - resolved with increased hydration  3. Chronic obstructive pulmonary disease, unspecified COPD type ([place]) - asymptomatic, does not require oxygen  4. Malignant neoplasm of left lung, unspecified part of lung ([place]) - has refused aggressive treatment at this time  5. GERD without esophagitis - stable with pepcid - cont to avoid food triggers  6. Chronic constipation - moving bowel daily, cont prn regimen  7. Protein-calorie malnutrition, severe - appetite fair - encourage increased po intake and hydration  8. Major depression, recurrent, chronic (HCC) - positive mood - cont Effexor, Celexa and amitriptyline- continue to monitor for serotonin syndrome and signs of QT prolongation  9. Essential hypertension - bp at goal < 150/90, cont regimen  10. Anxiety - anxious at times due to current health situation - cont ativan prn   Family/ staff Communication: plan discussed with patient and facility nurse  Labs/tests ordered: cbc/diff, bmp in 1-2 weeks

## 2021-03-02 DEATH — deceased

## 2022-01-24 IMAGING — CT CT RENAL STONE PROTOCOL
2 of 4 series · 13 of 46 positions shown, 15 images · non-contrast
Comparison: CT chest 10/05/2019

CLINICAL DATA: Hematuria, unknown cause

EXAM:
CT ABDOMEN AND PELVIS WITHOUT CONTRAST
TECHNIQUE: Multidetector CT imaging of the abdomen and pelvis was performed
following the standard protocol without IV contrast.

[Series 2: axial st · axial · 0.62mm/px · z∈[-613,-298]mm · 10 of 77 slices shown, 12 images]
[im 7/77  soft-tissue]
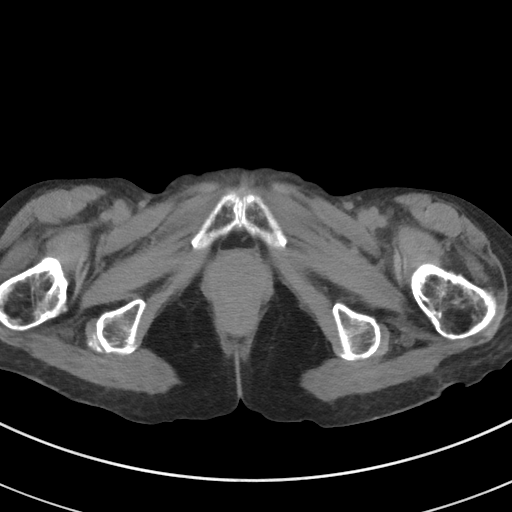
[im 7/77  bone]
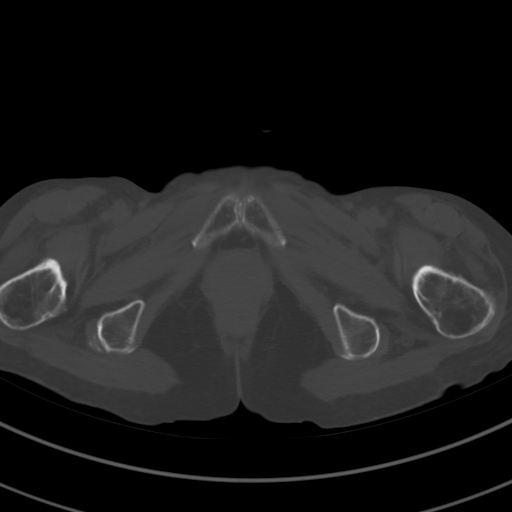
[im 13/77  soft-tissue]
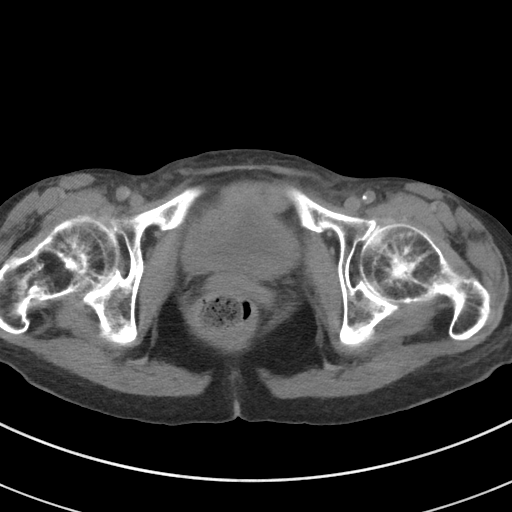
[im 22/77  soft-tissue]
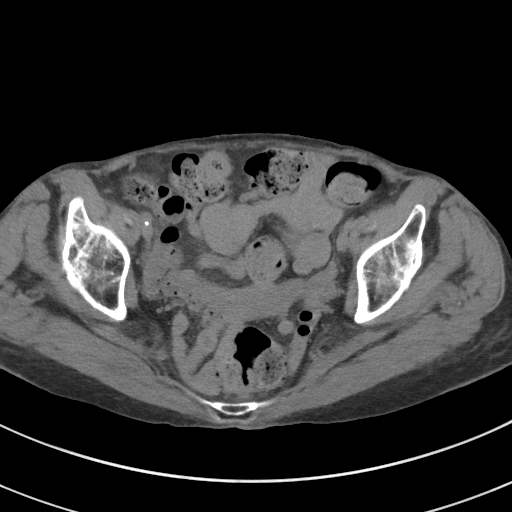
[im 28/77  soft-tissue]
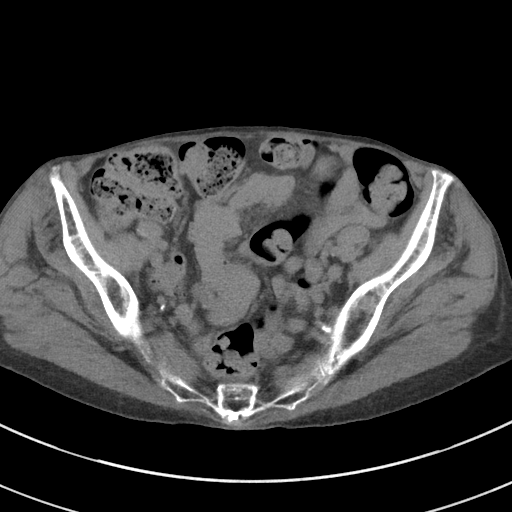
[im 34/77  soft-tissue]
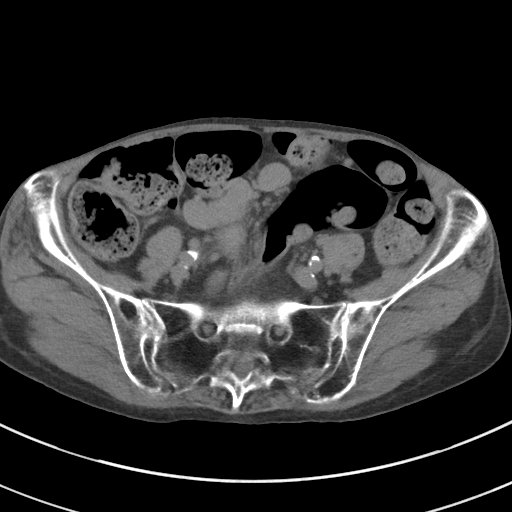
[im 43/77  soft-tissue]
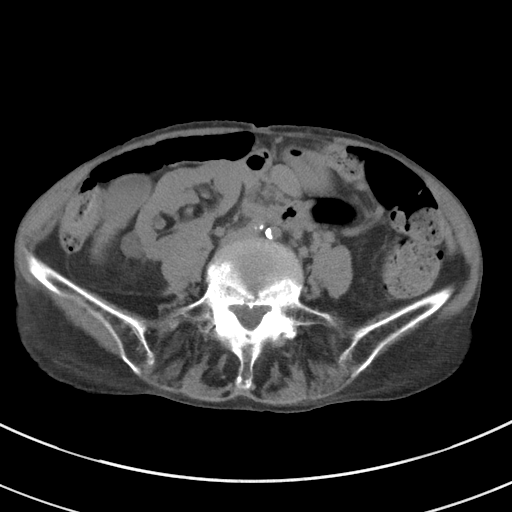
[im 49/77  soft-tissue]
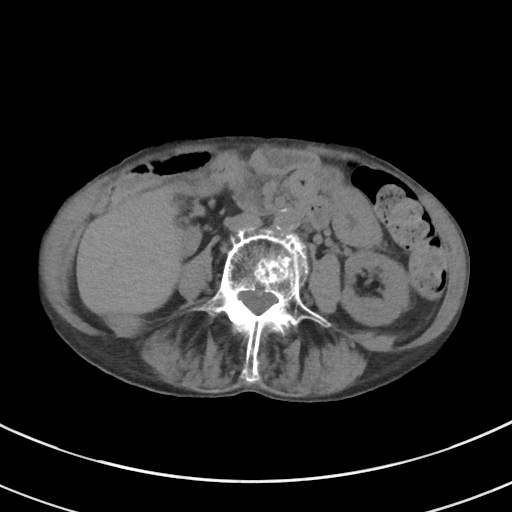
[im 58/77  soft-tissue]
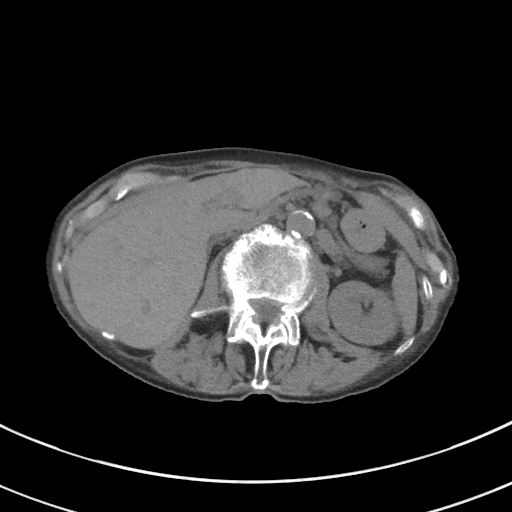
[im 64/77  soft-tissue]
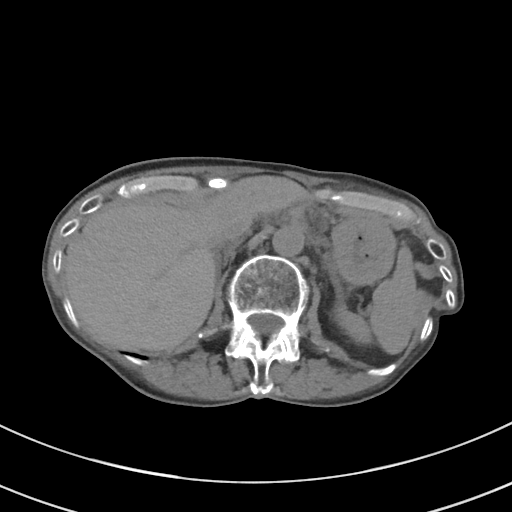
[im 64/77  bone]
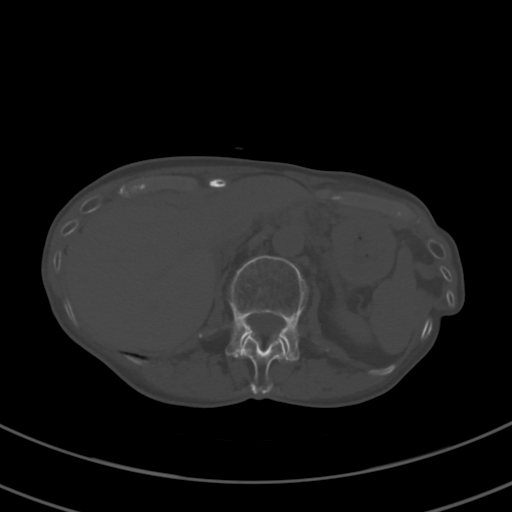
[im 70/77  soft-tissue]
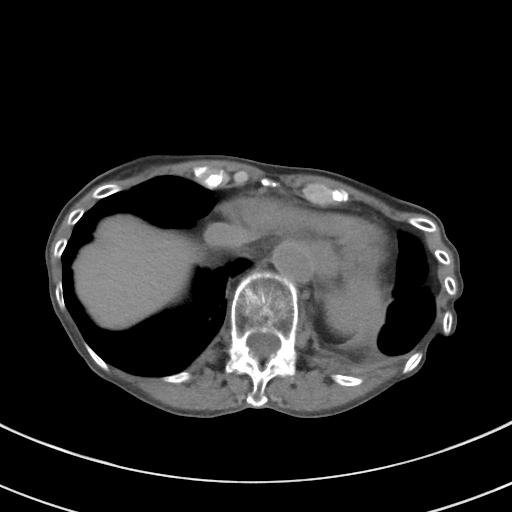

[Series 4: coronal st · coronal · 0.75mm/px · 3 of 75 slices shown]
[im 25/75  soft-tissue]
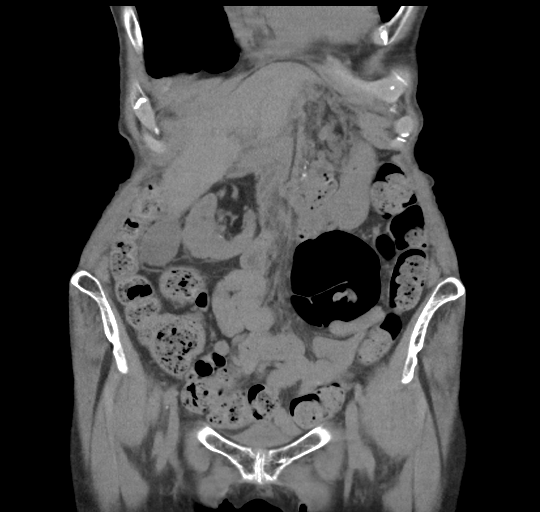
[im 33/75  soft-tissue]
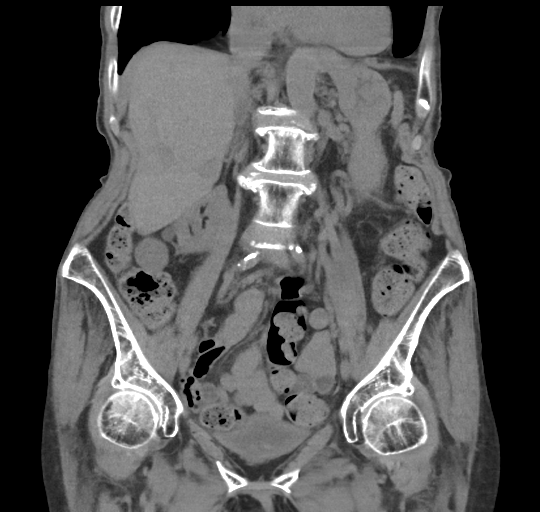
[im 42/75  soft-tissue]
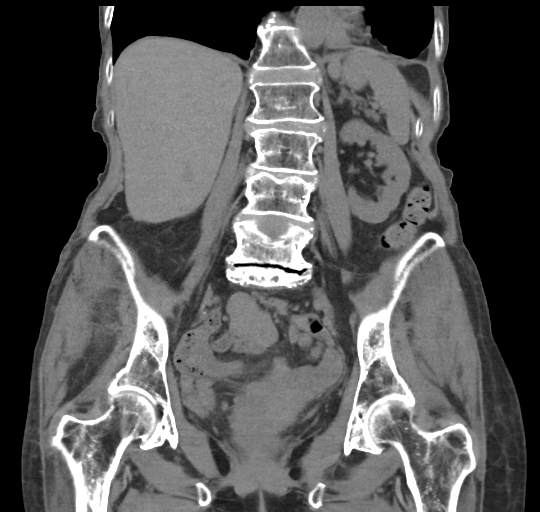

[13 of 46 positions shown; findings below may reference images not displayed]

FINDINGS: Lower chest: Emphysematous changes noted in the lung bases. Bandlike
opacity in the lung bases likely reflect areas of scarring. Right
lung is otherwise relatively clear. There is a trace left pleural
effusion and atelectatic change with some questionable pleural
thickening, incompletely assessed on noncontrast exam. Thickened and
fluid-filled distal esophagus is noted. Cardiac size within normal
limits with slight compression of the right heart by the chest wall
deformity. Hypoattenuation of the cardiac blood pool may reflect a
mild anemia.

Hepatobiliary: No visible focal liver lesion within limitations of
this unenhanced CT gallbladder contains some dependently layering
attenuation which may reflect biliary sludge. No pericholecystic
fluid or inflammation. No biliary ductal dilatation or intraductal
gallstones are seen.

Pancreas: There is fatty infiltration of the pancreas, particularly
towards the pancreatic head and uncinate. No clear peripancreatic
inflammation or pancreatic ductal dilatation.

Spleen: Normal in size. No concerning splenic lesions.

Adrenals/Urinary Tract: Normal adrenal glands. Slight inferior
positioning of the right kidney may related to normal anatomic
variance or chest wall deformity. No visible concerning renal
lesion. No urolithiasis or hydronephrosis. Mild symmetric bilateral
perinephric stranding, a nonspecific finding which may correlate
with advanced age or decreased renal function. Bladder is largely
decompressed though wall thickening and hazy perivesicular stranding
is more conspicuous than mere underdistention. Additionally, there
is some questionable asymmetric thickening on the left
posterolateral aspect of the bladder wall and some layering
hyperdensity in the urinary bladder lumen concerning for hemorrhagic
products/clot given hematuria.

Stomach/Bowel: Irregular thickening of the fluid-filled distal
thoracic esophagus with adjacent inflammatory stranding lower
mediastinum. Extensive paraesophageal and gastric vascular
collaterals are noted as well. Distal stomach and duodenum are
unremarkable. No small bowel thickening or dilatation. Moderate to
large colonic stool burden. Normal appendix. No evidence of bowel
obstruction.

Vascular/Lymphatic: Upper abdominal vascular collaterals.
Atherosclerotic calcifications within the abdominal aorta and branch
vessels. No aneurysm or ectasia. No enlarged abdominopelvic lymph
nodes.

Reproductive: Retroverted uterus.  No concerning adnexal lesions.

Other: No abdominopelvic free fluid or free gas. No bowel containing
hernias.

Musculoskeletal: Chest wall deformity, levocurvature of the lumbar
spine, remote appearing incomplete burst fracture involving the
superior endplate L5 with slight retropulsion, in combination with
severe facet degenerative changes and ligamentum flavum infolding
there is resulting severe canal stenosis at the L4-5 level.
Additional moderate to severe stenoses present L3-4 as well
multilevel moderate to severe neural foraminal narrowing is seen
throughout the lumbar levels. The overall sclerotic appearance of
the L5 and S1 vertebral bodies is favored to be on a Modic type
bases given the absence of adjacent inflammation, fluid collections
or other features to suggest infection.
IMPRESSION: 1. Bladder is largely decompressed though wall thickening and hazy
perivesicular stranding is more conspicuous than mere
underdistention. Additionally, there is some questionable asymmetric
thickening on the left posterolateral aspect of the bladder wall and
some layering hyperdensity in the urinary bladder lumen concerning
for hemorrhagic products/clot given hematuria. Recommend further
evaluation with urinalysis and cystoscopy.
2. Mild symmetric bilateral perinephric stranding, a nonspecific
finding which may correlate with advanced age or decreased renal
function though given bladder findings, ascending tract infection is
not excluded.
3. Chronic distal esophageal thickening with some adjacent hazy
stranding in the lower mediastinum. Correlate for esophageal
symptoms and results of prior endoscopy if performed, otherwise
consider direct visualization.
4. Left pleural effusion and atelectatic change with some
questionable pleural thickening, incompletely assessed on
noncontrast exam. While this could feasibly be related to the
esophageal process, the appearance is fairly similar to comparison
study from 9191 and could reflect some chronic change.
5. Extensive paraesophageal and gastric vascular collaterals.
6. Remote appearing incomplete burst fracture involving the superior
endplate L5 with slight retropulsion, in combination with severe
facet degenerative changes and ligamentum flavum infolding there is
resulting severe canal stenosis at the L4-5 level. Additional
moderate to severe stenoses present L3-4 as well as multilevel
moderate to severe neural foraminal narrowing throughout the lumbar
levels.
7. Hypoattenuation of the cardiac blood pool may reflect a mild
anemia.
8. Aortic Atherosclerosis (UDILE-VN2.2)
9. Emphysema (UDILE-QGI.2).

These results were called by telephone at the time of interpretation
on 11/13/2020 at [DATE] to provider Tejfel, who verbally
acknowledged these results.

## 2022-01-25 IMAGING — DX DG CHEST 1V PORT
1 series · 1 of 1 positions shown · non-contrast
Comparison: CT 11/13/2020.

CLINICAL DATA: Chest pain.

EXAM:
PORTABLE CHEST 1 VIEW

[chest]
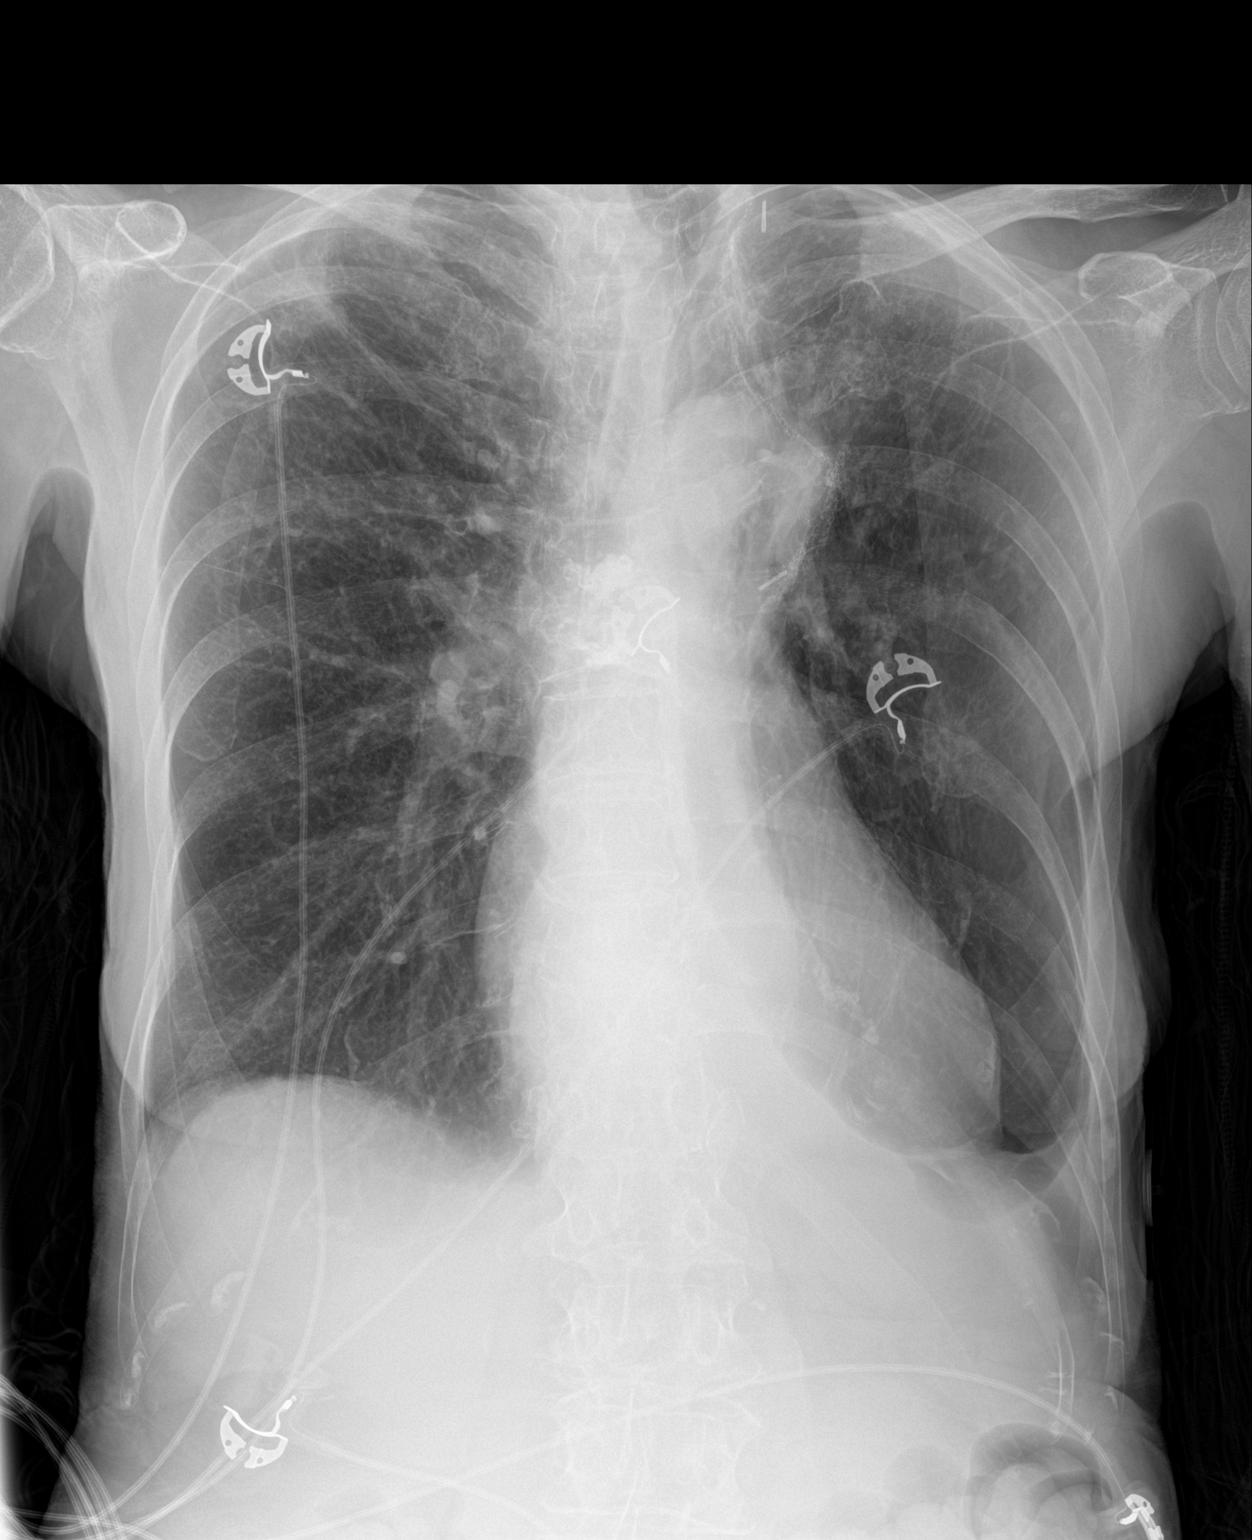

[1 of 1 positions shown; findings below may reference images not displayed]

FINDINGS: Mediastinum and hilar structures normal. Stable cardiomegaly. No
pulmonary venous congestion. Postsurgical changes left lung.
Persistent densities noted both upper lungs best seen by prior CT.
Nodular density in the right base noted CT not visualized by plain
chest x-ray. Reference is made to prior CT of 11/13/2020. Small left
pleural effusion. No pneumothorax.
IMPRESSION: 1. Stable cardiomegaly.  No pulmonary venous congestion.
2. Postsurgical changes left lung. Persistent densities in the upper
lungs, best identified by CT. Nodular density in the right lung base
best identified by CT. No acute alveolar infiltrates. Small left
pleural effusion.

## 2022-01-25 IMAGING — CT CT CHEST W/ CM
2 of 3 series · 13 of 36 positions shown, 16 images · IV contrast (Omnipaque)
Comparison: Same-day CT abdomen and pelvis, CT chest 10/05/2019

CLINICAL DATA: Chest pain, shortness of breath, incontinent urine,
hematuria, abnormal abdominal CT

EXAM:
CT CHEST WITH CONTRAST
TECHNIQUE: Multidetector CT imaging of the chest was performed during
intravenous contrast administration.
CONTRAST:  60mL OMNIPAQUE IOHEXOL 300 MG/ML  SOLN

[Series 2: axial st · axial · 0.56mm/px · z∈[+934,+1200]mm · 10 of 63 slices shown, 13 images]
[im 5/63  mediastinal]
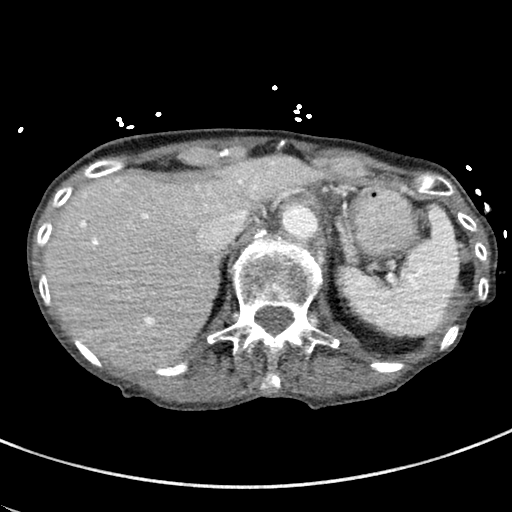
[im 5/63  lung]
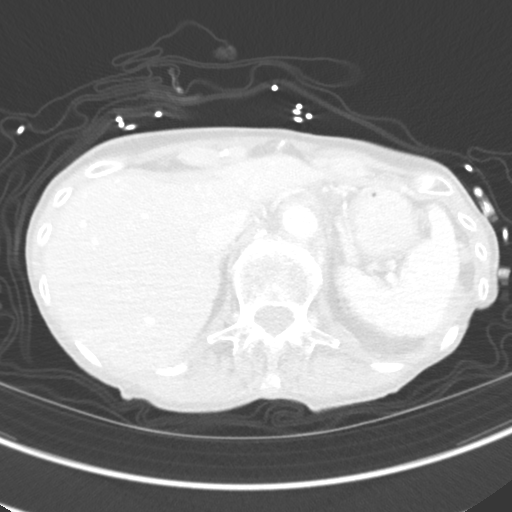
[im 10/63  lung]
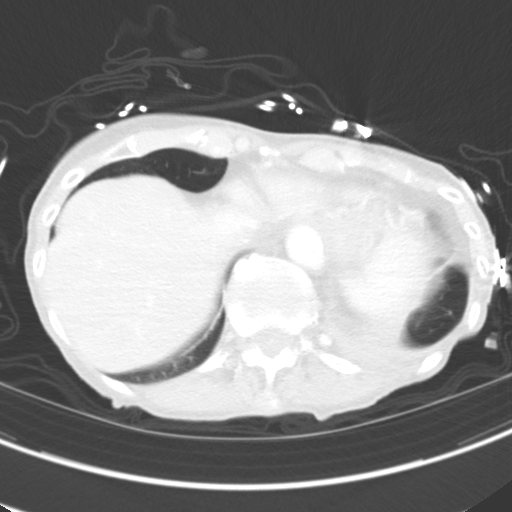
[im 17/63  lung]
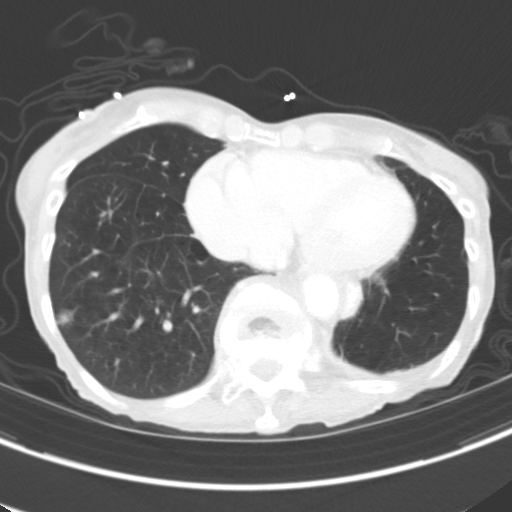
[im 23/63  lung]
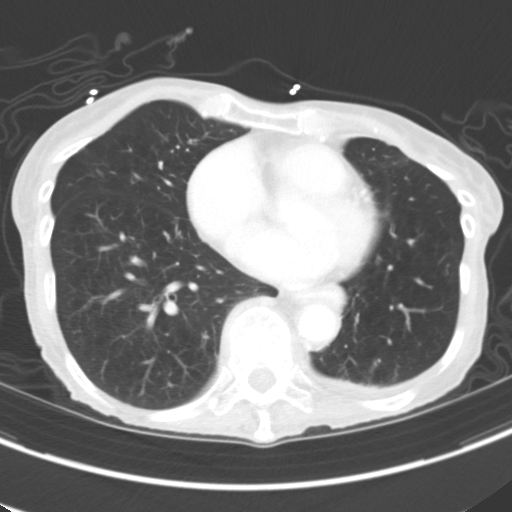
[im 28/63  mediastinal]
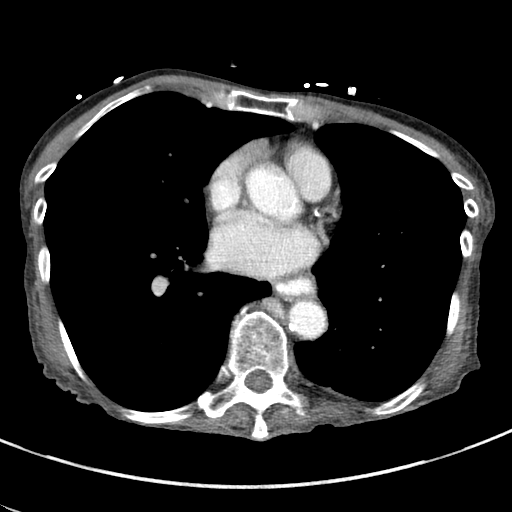
[im 28/63  lung]
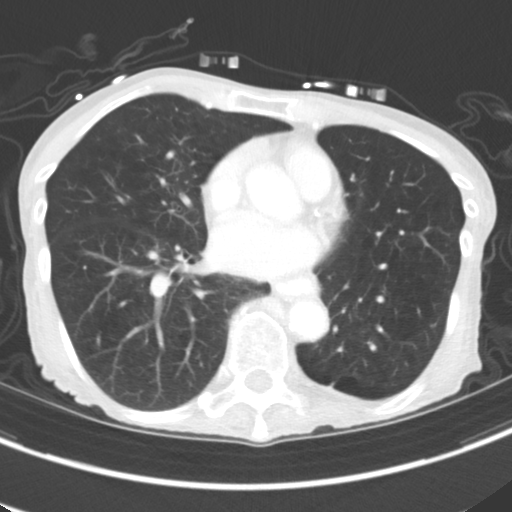
[im 35/63  lung]
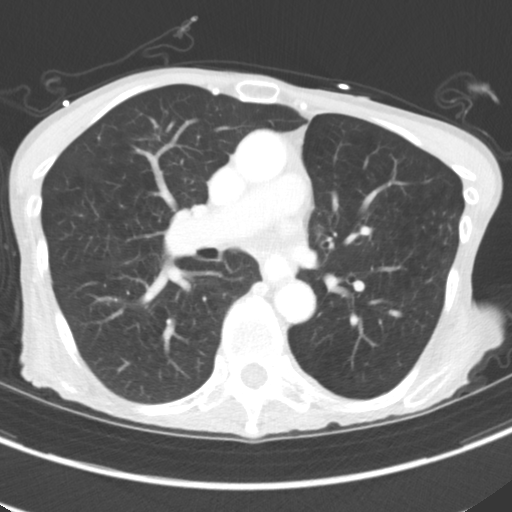
[im 40/63  lung]
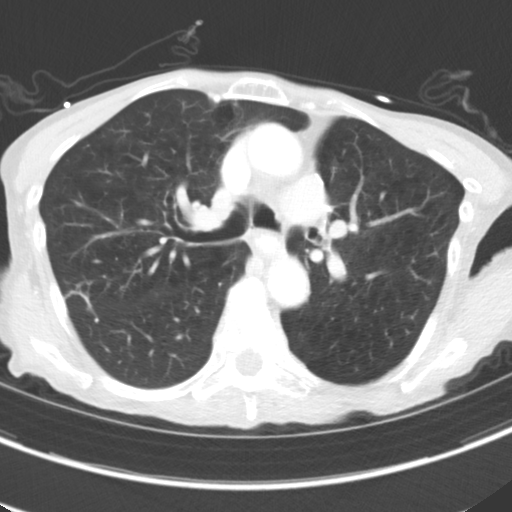
[im 46/63  lung]
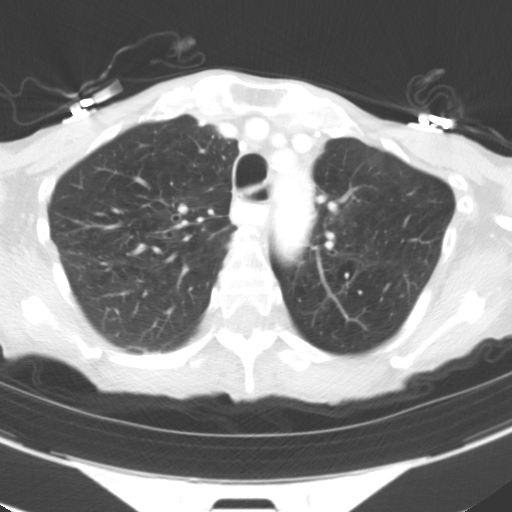
[im 53/63  mediastinal]
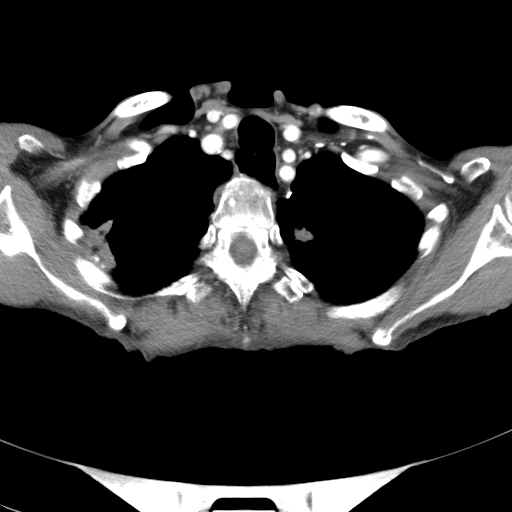
[im 53/63  lung]
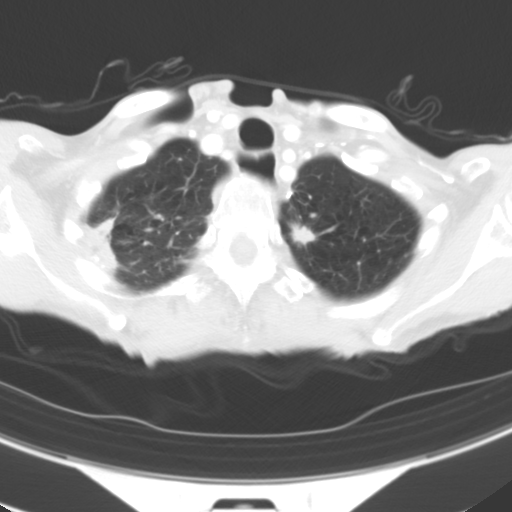
[im 58/63  lung]
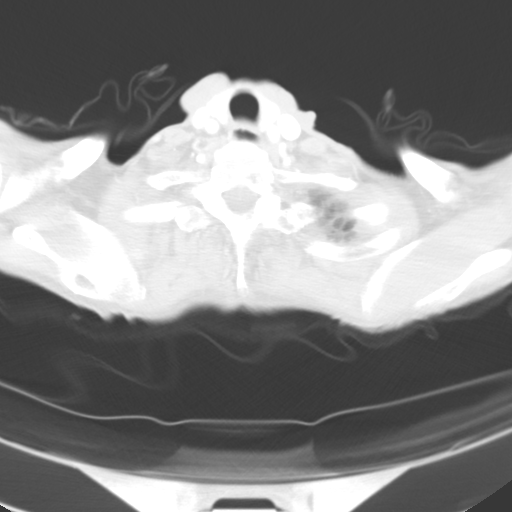

[Series 5: coronal · coronal · 0.61mm/px · 3 of 127 slices shown]
[im 26/127  lung]
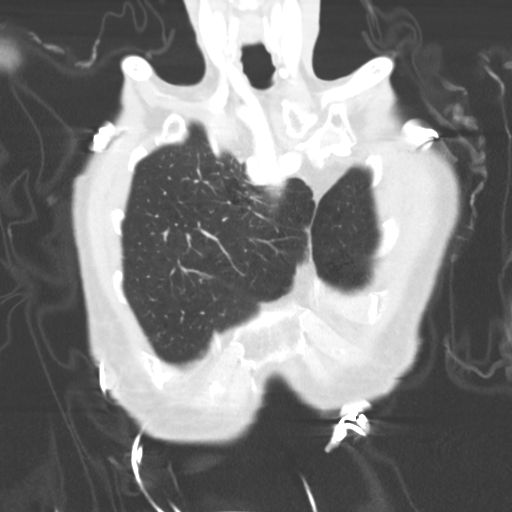
[im 51/127  lung]
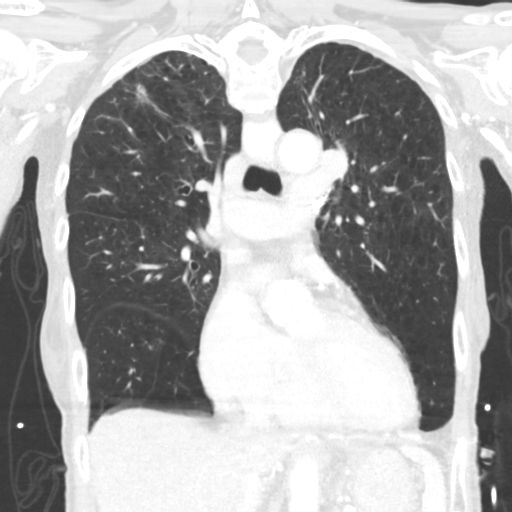
[im 76/127  lung]
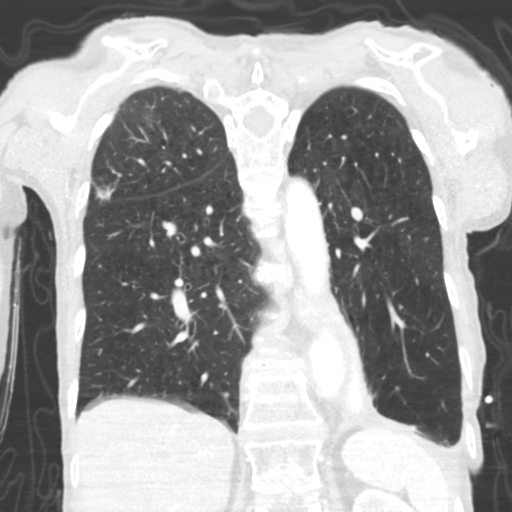

[13 of 36 positions shown; findings below may reference images not displayed]

FINDINGS: Cardiovascular: Cardiac size top-normal to mildly enlarged. Mild
compression of the right heart by a chronic chest wall deformity.
Calcifications of the mitral annulus and minimally upon the aortic
leaflets. Coronary artery calcifications are present as well. No
pericardial effusion. No large central or lobar pulmonary artery
filling defects within the limitations of this unenhanced CT.
Surgical truncation of left hilar vessels, likely related to prior
lobectomy. Atherosclerotic plaque within the normal caliber aorta.
No acute luminal abnormality of the imaged aorta. No periaortic
stranding or hemorrhage. Normal 3 vessel branching of the aortic
arch. Proximal great vessels are mildly calcified but otherwise
unremarkable.

Mediastinum/Nodes: High attenuation enteric contrast media was
administered with the patient on the table for assessment of the
thoracic esophagus. There is circumferential thickening of the
distal thoracic esophagus and to a lesser extent the GE junction
however, there is no extravasation of the enteric contrast media nor
extraluminal free air to suggest esophageal perforation. Some
minimal adjacent stranding is likely reactive.

Fluid within the pericardial recesses. No free mediastinal fluid or
gas. No organized collection or abscess. Normal thyroid gland and
thoracic inlet. No acute abnormality of the trachea or esophagus. No
worrisome mediastinal, hilar or axillary adenopathy.

Lungs/Pleura: Postsurgical changes in the lungs likely reflecting a
prior left lobectomy, correlate with surgical history. There is a
small chronic left pleural effusion with some minimal pleural
thickening and adjacent atelectasis, not significantly changed from
comparison exam.

There is increasingly conspicuous spiculated foci in the medial left
lung apex measuring now 9 x 17 mm, previously 7 x 11 mm ([DATE])
additional scarring, retraction architectural distortion is seen
involving a spiculated nodule previously seen in the right lung apex
([DATE] with changes now extending to the pleural surface measuring
2.5 x 2.2 cm in transaxial dimension. Some additional bandlike
scarring is seen in the posterior segment right upper lobe (3/60).
New sub solid, subpleural nodule measuring 12.5 x 8 mm (3/116) in
the periphery of the right lower lobe. No other concerning pulmonary
nodules or masses. Background of centrilobular emphysematous change
and chronic bronchitic features.

Upper Abdomen: Vascular collaterals in the upper abdomen about the
esophagus and GE junction. Mild thickening near the gastric cardia,
contiguous with esophageal thickening above. For additional findings
please see same day CT of the abdomen and pelvis.

Musculoskeletal: Chest wall asymmetry, possibly postsurgical in
nature. Paucity of subcutaneous fat, correlate with nutritional
status. Multilevel degenerative changes are present in the imaged
portions of the spine. Interval vertebroplasty changes involving the
T6, T7 vertebrae, unchanged remote vertebral body height loss T10.
Age indeterminate though possibly acute on chronic T11 vertebral
plana deformity given some adjacent paraspinal thickening. No other
acute or conspicuous osseous lesions.
IMPRESSION: 1. Circumferential thickening of the distal thoracic esophagus and
gastric cardia. No extravasation of administered enteric contrast
media nor extraluminal gas to suggest perforation. Minimal adjacent
stranding may be merely reactive.
2. Postsurgical changes in the left lung likely reflecting a prior
left lower lobectomy, correlate with surgical history. Chronic left
basilar effusion and pleural thickening is grossly similar to prior.
3. Increasingly conspicuous spiculated foci in the medial left lung
apex measuring now 9 x 17 mm, previously 7 x 11 mm. Additional
scarring, retraction architectural distortion is seen involving a
spiculated nodule previously seen in the right lung apex with
changes now extending to the pleural surface measuring 2.5 x 2.2 cm
in transaxial dimension. Findings are concerning for possible
malignancy.
4. New sub solid, subpleural nodule measuring 12.5 x 8 mm in the
periphery of the right lower lobe. This could reflect an infectious
or inflammatory process versus a metastatic lesion.
5. Interval vertebroplasty changes involving the T6, T7 vertebrae.
Stable remote compression deformity T10.
6. Possible acute on chronic T11 vertebral plana deformity given
some adjacent paraspinal thickening. Correlate for point tenderness.
7. Chronic chest wall asymmetry.
8. Paucity of subcutaneous fat, correlate with nutritional status.
9. Aortic Atherosclerosis ([id number])
10. Emphysema ([id number]) and chronic bronchitic changes.
